# Patient Record
Sex: Male | Born: 1950 | Race: White | Hispanic: No | Marital: Married | Smoking: Never smoker
Health system: Southern US, Community
[De-identification: ages and names within clinical notes are randomized; demographics above are authoritative.]

## PROBLEM LIST (undated history)

## (undated) DIAGNOSIS — R1013 Epigastric pain: Secondary | ICD-10-CM

## (undated) DIAGNOSIS — R7303 Prediabetes: Secondary | ICD-10-CM

## (undated) DIAGNOSIS — I10 Essential (primary) hypertension: Secondary | ICD-10-CM

## (undated) DIAGNOSIS — I5189 Other ill-defined heart diseases: Secondary | ICD-10-CM

## (undated) DIAGNOSIS — E782 Mixed hyperlipidemia: Secondary | ICD-10-CM

## (undated) DIAGNOSIS — I2510 Atherosclerotic heart disease of native coronary artery without angina pectoris: Secondary | ICD-10-CM

## (undated) DIAGNOSIS — N189 Chronic kidney disease, unspecified: Secondary | ICD-10-CM

## (undated) DIAGNOSIS — M51369 Other intervertebral disc degeneration, lumbar region without mention of lumbar back pain or lower extremity pain: Secondary | ICD-10-CM

## (undated) DIAGNOSIS — R079 Chest pain, unspecified: Secondary | ICD-10-CM

## (undated) DIAGNOSIS — M1380 Other specified arthritis, unspecified site: Secondary | ICD-10-CM

## (undated) DIAGNOSIS — Z7982 Long term (current) use of aspirin: Secondary | ICD-10-CM

## (undated) DIAGNOSIS — N400 Enlarged prostate without lower urinary tract symptoms: Secondary | ICD-10-CM

## (undated) DIAGNOSIS — G4733 Obstructive sleep apnea (adult) (pediatric): Secondary | ICD-10-CM

## (undated) DIAGNOSIS — I200 Unstable angina: Secondary | ICD-10-CM

## (undated) DIAGNOSIS — R011 Cardiac murmur, unspecified: Secondary | ICD-10-CM

## (undated) DIAGNOSIS — K219 Gastro-esophageal reflux disease without esophagitis: Secondary | ICD-10-CM

## (undated) DIAGNOSIS — G4730 Sleep apnea, unspecified: Secondary | ICD-10-CM

## (undated) DIAGNOSIS — N183 Chronic kidney disease, stage 3 unspecified: Secondary | ICD-10-CM

## (undated) DIAGNOSIS — F32A Depression, unspecified: Secondary | ICD-10-CM

## (undated) DIAGNOSIS — N200 Calculus of kidney: Secondary | ICD-10-CM

## (undated) DIAGNOSIS — H269 Unspecified cataract: Secondary | ICD-10-CM

## (undated) DIAGNOSIS — K5730 Diverticulosis of large intestine without perforation or abscess without bleeding: Secondary | ICD-10-CM

## (undated) DIAGNOSIS — K591 Functional diarrhea: Secondary | ICD-10-CM

## (undated) DIAGNOSIS — Z9841 Cataract extraction status, right eye: Secondary | ICD-10-CM

## (undated) DIAGNOSIS — Z7961 Long term (current) use of immunomodulator: Secondary | ICD-10-CM

## (undated) DIAGNOSIS — E7800 Pure hypercholesterolemia, unspecified: Secondary | ICD-10-CM

## (undated) DIAGNOSIS — Z9185 Personal history of military service: Secondary | ICD-10-CM

## (undated) DIAGNOSIS — M1990 Unspecified osteoarthritis, unspecified site: Secondary | ICD-10-CM

## (undated) HISTORY — DX: Unstable angina: I20.0

## (undated) HISTORY — PX: VASECTOMY: SHX75

## (undated) HISTORY — DX: Pure hypercholesterolemia, unspecified: E78.00

## (undated) HISTORY — PX: EYE SURGERY: SHX253

## (undated) HISTORY — PX: OTHER SURGICAL HISTORY: SHX169

## (undated) HISTORY — DX: Gastro-esophageal reflux disease without esophagitis: K21.9

## (undated) HISTORY — DX: Unspecified osteoarthritis, unspecified site: M19.90

## (undated) HISTORY — DX: Sleep apnea, unspecified: G47.30

## (undated) HISTORY — DX: Mixed hyperlipidemia: E78.2

## (undated) HISTORY — DX: Chest pain, unspecified: R07.9

## (undated) HISTORY — PX: CATARACT EXTRACTION: SUR2

## (undated) HISTORY — DX: Atherosclerotic heart disease of native coronary artery without angina pectoris: I25.10

## (undated) HISTORY — DX: Essential (primary) hypertension: I10

## (undated) HISTORY — PX: COLONOSCOPY: SHX174

## (undated) HISTORY — DX: Epigastric pain: R10.13

---

## 1968-10-01 DIAGNOSIS — R010 Benign and innocent cardiac murmurs: Secondary | ICD-10-CM

## 1968-10-01 HISTORY — DX: Benign and innocent cardiac murmurs: R01.0

## 2004-09-14 ENCOUNTER — Ambulatory Visit (HOSPITAL_COMMUNITY): Admission: RE | Admit: 2004-09-14 | Discharge: 2004-09-14 | Payer: Self-pay | Admitting: Gastroenterology

## 2004-09-21 ENCOUNTER — Ambulatory Visit (HOSPITAL_COMMUNITY): Admission: RE | Admit: 2004-09-21 | Discharge: 2004-09-21 | Payer: Self-pay | Admitting: Internal Medicine

## 2004-09-23 ENCOUNTER — Ambulatory Visit (HOSPITAL_COMMUNITY): Admission: RE | Admit: 2004-09-23 | Discharge: 2004-09-23 | Payer: Self-pay | Admitting: Internal Medicine

## 2004-09-28 ENCOUNTER — Ambulatory Visit (HOSPITAL_COMMUNITY): Admission: RE | Admit: 2004-09-28 | Discharge: 2004-09-28 | Payer: Self-pay | Admitting: Internal Medicine

## 2004-10-09 ENCOUNTER — Ambulatory Visit (HOSPITAL_COMMUNITY): Admission: RE | Admit: 2004-10-09 | Discharge: 2004-10-09 | Payer: Self-pay | Admitting: Internal Medicine

## 2005-02-08 ENCOUNTER — Encounter: Admission: RE | Admit: 2005-02-08 | Discharge: 2005-02-08 | Payer: Self-pay | Admitting: Internal Medicine

## 2005-03-14 ENCOUNTER — Ambulatory Visit (HOSPITAL_BASED_OUTPATIENT_CLINIC_OR_DEPARTMENT_OTHER): Admission: RE | Admit: 2005-03-14 | Discharge: 2005-03-15 | Payer: Self-pay | Admitting: Orthopedic Surgery

## 2005-08-10 IMAGING — NM NM HEPATO W/GB/PHARM/[PERSON_NAME]
1 series · 6 of 6 positions shown · non-contrast
Comparison: none

CLINICAL DATA: abdominal pain
 NUCLEAR MEDICINE HEPATOBILIARY STUDY WITH GALLBLADDER EJECTION FRACTION:
 5 mCi of 1cOOm Choletec injected intravenously and 1.73 ug of CCK injected intravenously at sixty minutes for evaluation of gallbladder function gallbladder ejection fraction.
 Prompt homogeneous hepatic activity is noted.  Gallbladder activity is noted at fifteen minutes and small bowel activity is noted at ten to fifteen minutes. 
 After the administration of CCK, gallbladder ejection fraction is calculated at 84% at thirty minutes.

[Series 1: he hepatobiliary · 3.22mm/px · 6 of 33 frames shown]
[frame 3/33]
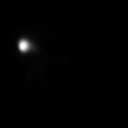
[frame 9/33]
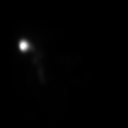
[frame 14/33]
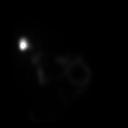
[frame 20/33]
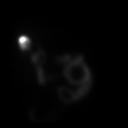
[frame 25/33]
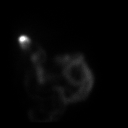
[frame 31/33]
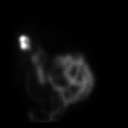

[6 of 6 positions shown; findings below may reference images not displayed]

IMPRESSION: Normal hepatobiliary study with normal gallbladder ejection fraction.

## 2008-10-01 HISTORY — PX: CARDIAC CATHETERIZATION: SHX172

## 2008-11-26 ENCOUNTER — Encounter: Admission: RE | Admit: 2008-11-26 | Discharge: 2008-11-26 | Payer: Self-pay | Admitting: Internal Medicine

## 2009-03-08 ENCOUNTER — Observation Stay (HOSPITAL_COMMUNITY): Admission: AD | Admit: 2009-03-08 | Discharge: 2009-03-10 | Payer: Self-pay | Admitting: Cardiology

## 2009-03-09 DIAGNOSIS — I2510 Atherosclerotic heart disease of native coronary artery without angina pectoris: Secondary | ICD-10-CM

## 2009-03-09 HISTORY — PX: CORONARY ANGIOPLASTY WITH STENT PLACEMENT: SHX49

## 2009-03-09 HISTORY — DX: Atherosclerotic heart disease of native coronary artery without angina pectoris: I25.10

## 2009-03-18 ENCOUNTER — Encounter: Admission: RE | Admit: 2009-03-18 | Discharge: 2009-03-18 | Payer: Self-pay | Admitting: Cardiology

## 2009-03-22 ENCOUNTER — Ambulatory Visit (HOSPITAL_COMMUNITY): Admission: RE | Admit: 2009-03-22 | Discharge: 2009-03-23 | Payer: Self-pay | Admitting: Cardiology

## 2009-03-22 HISTORY — PX: CORONARY STENT INTERVENTION: CATH118234

## 2010-02-01 ENCOUNTER — Encounter: Admission: RE | Admit: 2010-02-01 | Discharge: 2010-02-01 | Payer: Self-pay | Admitting: Nephrology

## 2010-10-01 HISTORY — PX: SHOULDER ARTHROSCOPY: SHX128

## 2010-11-01 HISTORY — PX: SHOULDER ARTHROSCOPY: SHX128

## 2010-11-09 ENCOUNTER — Encounter (HOSPITAL_COMMUNITY)
Admission: RE | Admit: 2010-11-09 | Discharge: 2010-11-09 | Disposition: A | Discharge status: Home / Self Care | Payer: BC Managed Care – PPO | Source: Ambulatory Visit | Attending: Orthopaedic Surgery | Admitting: Orthopaedic Surgery

## 2010-11-09 ENCOUNTER — Ambulatory Visit (HOSPITAL_COMMUNITY)
Admission: RE | Admit: 2010-11-09 | Discharge: 2010-11-09 | Disposition: A | Discharge status: Home / Self Care | Payer: BC Managed Care – PPO | Source: Ambulatory Visit | Attending: Orthopaedic Surgery | Admitting: Orthopaedic Surgery

## 2010-11-09 ENCOUNTER — Other Ambulatory Visit (HOSPITAL_COMMUNITY): Payer: Self-pay | Admitting: Orthopaedic Surgery

## 2010-11-09 ENCOUNTER — Other Ambulatory Visit (HOSPITAL_COMMUNITY): Payer: Self-pay

## 2010-11-09 DIAGNOSIS — M7542 Impingement syndrome of left shoulder: Secondary | ICD-10-CM

## 2010-11-09 DIAGNOSIS — R079 Chest pain, unspecified: Secondary | ICD-10-CM | POA: Insufficient documentation

## 2010-11-09 DIAGNOSIS — Z01818 Encounter for other preprocedural examination: Secondary | ICD-10-CM | POA: Insufficient documentation

## 2010-11-09 DIAGNOSIS — M25819 Other specified joint disorders, unspecified shoulder: Secondary | ICD-10-CM | POA: Insufficient documentation

## 2010-11-09 DIAGNOSIS — M47814 Spondylosis without myelopathy or radiculopathy, thoracic region: Secondary | ICD-10-CM | POA: Insufficient documentation

## 2010-11-09 LAB — BASIC METABOLIC PANEL
BUN: 17 mg/dL (ref 6–23)
CO2: 27 mEq/L (ref 19–32)
Calcium: 9.5 mg/dL (ref 8.4–10.5)
Chloride: 105 mEq/L (ref 96–112)
Creatinine, Ser: 1.18 mg/dL (ref 0.4–1.5)
Glucose, Bld: 95 mg/dL (ref 70–99)
Sodium: 140 mEq/L (ref 135–145)

## 2010-11-09 LAB — CBC
HCT: 43.6 % (ref 39.0–52.0)
Hemoglobin: 15.1 g/dL (ref 13.0–17.0)
MCH: 30.2 pg (ref 26.0–34.0)
MCHC: 34.6 g/dL (ref 30.0–36.0)
MCV: 87.2 fL (ref 78.0–100.0)
Platelets: 200 10*3/uL (ref 150–400)
RBC: 5 MIL/uL (ref 4.22–5.81)
RDW: 12.8 % (ref 11.5–15.5)
WBC: 6 10*3/uL (ref 4.0–10.5)

## 2010-11-09 LAB — SURGICAL PCR SCREEN: MRSA, PCR: NEGATIVE

## 2010-11-14 ENCOUNTER — Ambulatory Visit (HOSPITAL_COMMUNITY)
Admission: RE | Admit: 2010-11-14 | Discharge: 2010-11-14 | Disposition: A | Discharge status: Home / Self Care | Payer: BC Managed Care – PPO | Source: Ambulatory Visit | Attending: Orthopaedic Surgery | Admitting: Orthopaedic Surgery

## 2010-11-14 DIAGNOSIS — M66329 Spontaneous rupture of flexor tendons, unspecified upper arm: Secondary | ICD-10-CM | POA: Insufficient documentation

## 2010-11-14 DIAGNOSIS — Z01812 Encounter for preprocedural laboratory examination: Secondary | ICD-10-CM | POA: Insufficient documentation

## 2010-11-14 DIAGNOSIS — M25819 Other specified joint disorders, unspecified shoulder: Secondary | ICD-10-CM | POA: Insufficient documentation

## 2010-11-14 DIAGNOSIS — Z01818 Encounter for other preprocedural examination: Secondary | ICD-10-CM | POA: Insufficient documentation

## 2010-11-14 DIAGNOSIS — Z01810 Encounter for preprocedural cardiovascular examination: Secondary | ICD-10-CM | POA: Insufficient documentation

## 2010-12-06 NOTE — Op Note (Addendum)
**Note Tom Goodman via Obfuscation** [name], [name]                ACCOUNT NO.:  [id number]  MEDICAL RECORD NO.:  [id number]           PATIENT TYPE:  O  LOCATION:  SDSC                         FACILITY:  [facility]  PHYSICIAN:  Lubertha Basque. Shannara Winbush, M.D.DATE OF BIRTH:  [birth year]  DATE OF PROCEDURE:  11/14/2010 DATE OF DISCHARGE:  11/14/2010                              OPERATIVE REPORT   PREOPERATIVE DIAGNOSES: 1. Left shoulder impingement. 2. Left shoulder biceps tear.  POSTOPERATIVE DIAGNOSES: 1. Left shoulder impingement. 2. Left shoulder biceps tear.  PROCEDURES: 1. Left shoulder arthroscopic acromioplasty. 2. Left shoulder arthroscopic debridement. 3. Left shoulder arthroscopic partial claviculectomy.  ANESTHESIA:  General and block.  ATTENDING SURGEON:  Lubertha Basque. Jerl Santos, MD  ASSISTANT:  Lindwood Qua, PA   INDICATIONS FOR PROCEDURE:  The patient is a 60 year old man with a long history of left shoulder pain.  This has not responded to injections,medication, or exercise program.  By MRI scan, he has a chronic biceps rupture with some irritation of the rotator cuff consistent with impingement and partial-thickness tearing.  He has pain which limits his ability to use his arm and rest and he is offered an arthroscopy. Informed operative consent was obtained after discussion of possible complications including reaction to anesthesia and infection.  SUMMARY OF FINDINGS AND PROCEDURE:  Under general anesthesia and a block, an arthroscopy of the left shoulder was performed.  The glenohumeral joint showed no degenerative change.  He was absent the biceps tendon and the stump of this was debrided a bit.  The other labral structures were intact.  The rotator cuff appeared benign from below.  In the subacromial space, he had things consistent with impingement, related to the shape of the acromion and the distal clavicle.  An acromioplasty along with a coplane of the [facility] joint was performed.  The cuff was  debrided, but no tear worthy of repair was found.  He was discharged to home same day to follow up in the office closely.  This case was done in the main operating room due to some medical concerns.  DESCRIPTION OF PROCEDURE:  The patient was taken to the operating suite where general anesthetic was applied without difficulty.  He was also given a block in the preanesthesia area.  He was positioned in beach- chair position and prepped and draped in the normal sterile fashion. After administration of IV Kefzol, an arthroscopy of the left shoulder was performed through a total of 2 portals.  Findings were as noted above.  Procedure consisted of the debridement of the biceps stump as well as the bursa on the superior aspect of the cuff.  An acromioplasty was done with a bur in the lateral position followed by transfer of the bur to the posterior position.  The undersurface of the clavicle was also removed with the bur.  The shoulder was thoroughly irrigated followed by reapproximation of portals loosely with nylon.  Adaptic was applied followed by dry gauze and tape.  Estimated blood loss and intraoperative fluids can be obtained from anesthesia records.  DISPOSITION:  The patient was extubated in the operating room and taken to the recovery  room in stable addition.  He was to go home on the same day and follow up in the office closely.  I will contact him by phone tonight.     Lubertha Basque Jerl Santos, M.D.     PGD/MEDQ  D:  11/14/2010  T:  11/15/2010  Job:  [id number]  Electronically Signed by Marcene Corning M.D. on 12/05/2010 07:32:31 PM

## 2011-01-08 LAB — COMPREHENSIVE METABOLIC PANEL
ALT: 23 U/L (ref 0–53)
AST: 23 U/L (ref 0–37)
Albumin: 3.5 g/dL (ref 3.5–5.2)
Alkaline Phosphatase: 90 U/L (ref 39–117)
BUN: 19 mg/dL (ref 6–23)
CO2: 28 mEq/L (ref 19–32)
Calcium: 9.4 mg/dL (ref 8.4–10.5)
Chloride: 99 mEq/L (ref 96–112)
Creatinine, Ser: 1.42 mg/dL (ref 0.4–1.5)
GFR calc Af Amer: >60 mL/min (ref >60)
GFR calc non Af Amer: 51 mL/min — ABNORMAL LOW (ref >60)
Glucose, Bld: 129 mg/dL — ABNORMAL HIGH (ref 70–99)
Potassium: 3.6 mEq/L (ref 3.5–5.1)
Sodium: 135 mEq/L (ref 135–145)
Total Bilirubin: 0.7 mg/dL (ref 0.3–1.2)
Total Protein: 6.9 g/dL (ref 6.0–8.3)

## 2011-01-08 LAB — BASIC METABOLIC PANEL
BUN: 17 mg/dL (ref 6–23)
BUN: 19 mg/dL (ref 6–23)
BUN: 19 mg/dL (ref 6–23)
CO2: 28 mEq/L (ref 19–32)
CO2: 30 mEq/L (ref 19–32)
CO2: 25 mEq/L (ref 19–32)
Calcium: 9.1 mg/dL (ref 8.4–10.5)
Calcium: 9.4 mg/dL (ref 8.4–10.5)
Chloride: 101 mEq/L (ref 96–112)
Chloride: 103 mEq/L (ref 96–112)
Chloride: 102 mEq/L (ref 96–112)
Creatinine, Ser: 1.38 mg/dL (ref 0.4–1.5)
Creatinine, Ser: 1.5 mg/dL (ref 0.4–1.5)
Creatinine, Ser: 1.38 mg/dL (ref 0.4–1.5)
GFR calc Af Amer: 58 mL/min — ABNORMAL LOW (ref >60)
GFR calc Af Amer: >60 mL/min (ref >60)
GFR calc non Af Amer: 48 mL/min — ABNORMAL LOW (ref >60)
GFR calc non Af Amer: 53 mL/min — ABNORMAL LOW (ref >60)
Glucose, Bld: 106 mg/dL — ABNORMAL HIGH (ref 70–99)
Glucose, Bld: 152 mg/dL — ABNORMAL HIGH (ref 70–99)
Potassium: 4.2 mEq/L (ref 3.5–5.1)
Potassium: 4.5 mEq/L (ref 3.5–5.1)
Sodium: 137 mEq/L (ref 135–145)
Sodium: 137 mEq/L (ref 135–145)

## 2011-01-08 LAB — PLATELET COUNT: Platelets: 250 10*3/uL (ref 150–400)

## 2011-01-08 LAB — CBC
HCT: 41.4 % (ref 39.0–52.0)
HCT: 42.8 % (ref 39.0–52.0)
HCT: 43.8 % (ref 39.0–52.0)
Hemoglobin: 13.9 g/dL (ref 13.0–17.0)
Hemoglobin: 15.1 g/dL (ref 13.0–17.0)
MCHC: 33.6 g/dL (ref 30.0–36.0)
MCHC: 34.4 g/dL (ref 30.0–36.0)
MCV: 88.7 fL (ref 78.0–100.0)
MCV: 88.2 fL (ref 78.0–100.0)
MCV: 88.9 fL (ref 78.0–100.0)
Platelets: 217 10*3/uL (ref 150–400)
Platelets: 239 10*3/uL (ref 150–400)
RBC: 4.67 MIL/uL (ref 4.22–5.81)
RBC: 4.81 MIL/uL (ref 4.22–5.81)
RBC: 4.97 MIL/uL (ref 4.22–5.81)
RDW: 12.7 % (ref 11.5–15.5)
RDW: 12.4 % (ref 11.5–15.5)
WBC: 9.3 10*3/uL (ref 4.0–10.5)
WBC: 11.5 10*3/uL — ABNORMAL HIGH (ref 4.0–10.5)
WBC: 16.9 10*3/uL — ABNORMAL HIGH (ref 4.0–10.5)

## 2011-01-08 LAB — CARDIAC PANEL(CRET KIN+CKTOT+MB+TROPI)
CK, MB: 0.9 ng/mL (ref 0.3–4.0)
CK, MB: 1.3 ng/mL (ref 0.3–4.0)
CK, MB: 1.7 ng/mL (ref 0.3–4.0)
Relative Index: INVALID (ref 0.0–2.5)
Relative Index: INVALID (ref 0.0–2.5)
Relative Index: INVALID (ref 0.0–2.5)
Total CK: 70 U/L (ref 7–232)
Total CK: 94 U/L (ref 7–232)
Total CK: 99 U/L (ref 7–232)
Troponin I: 0.01 ng/mL (ref 0.00–0.06)
Troponin I: 0.01 ng/mL (ref 0.00–0.06)
Troponin I: 0.01 ng/mL (ref 0.00–0.06)

## 2011-01-08 LAB — APTT: aPTT: 30 seconds (ref 24–37)

## 2011-01-08 LAB — URINALYSIS, MICROSCOPIC ONLY
Glucose, UA: NEGATIVE mg/dL
Hgb urine dipstick: NEGATIVE
Ketones, ur: NEGATIVE mg/dL
Protein, ur: NEGATIVE mg/dL
pH: 5.5 (ref 5.0–8.0)

## 2011-01-08 LAB — URINE CULTURE: Colony Count: 8000

## 2011-01-08 LAB — PROTIME-INR
INR: 1.1 (ref 0.00–1.49)
Prothrombin Time: 14.9 seconds (ref 11.6–15.2)

## 2011-01-08 LAB — TSH: TSH: 0.796 u[IU]/mL (ref 0.350–4.500)

## 2011-02-13 NOTE — Assessment & Plan Note (Signed)
[facility]                                 ON-CALL NOTE   Tom Goodman, Tom Goodman                       MRN:          [id number]  DATE:03/10/2009                            DOB:          October 15, 1950    This is a patient of Dr. Amil Amen in [facility].   I received a telephone call from Tom Goodman's wife.  According to  the patient's wife, he has a history of coronary artery disease,  underwent coronary intervention yesterday.  He is scheduled to return  for a staged PCI next week.  Of note, he has a questionable history of  allergic reaction to IV CONTRAST DYE.  Since being discharged from the  hospital, he has developed, what appears to be a, sunburn rash over  his left chest and left arm.  There is no associated fever.  There is no  associated shortness of breath or difficulty breathing.  I explained to  the patient that without seeing the rash in person, I was unable to make  many comments about it.  However, I suspect this may be a potential  medication side effect, possibly from Plavix.  However, I recommended  that he continue taking his Plavix as well as all of his other  medications and that he will be seen by Dr. Amil Amen or by his primary  care Dannis Deroche tomorrow.  I did offer for them to come into the emergency  department for me to evaluate him; however, I explained them that I do  not feel this is necessarily necessary as he did not appear to be in  acute distress.  However, I explained that should his clinical status  deteriorate, then he should contact us again this evening.      Therisa Doyne, MD    SJT/MedQ  DD: 03/10/2009  DT: 03/11/2009  Job #: [id number]

## 2011-02-16 NOTE — Op Note (Signed)
NAMECHI, GARLOW                ACCOUNT NO.:  [id number]   MEDICAL RECORD NO.:  [id number]          PATIENT TYPE:  AMB   LOCATION:  DSC                          FACILITY:  [facility]   PHYSICIAN:  Matthew A. Weingold, M.D.DATE OF BIRTH:  1951-07-06   DATE OF PROCEDURE:  03/14/2005  DATE OF DISCHARGE:                                 OPERATIVE REPORT   PREOPERATIVE DIAGNOSIS:  Right thumb CMC arthritis.   POSTOPERATIVE DIAGNOSIS:  Right thumb CMC arthritis.   PROCEDURE:  Right thumb CMC suspension plasty with APL tendon transfer.   ASSISTANT:  Artist Pais. Mina Marble, M.D.   ASSISTANT:   ANESTHESIA:  General.   TOURNIQUET TIME:  50 minutes.   COMPLICATIONS:  None.   DRAINS:  None.   OPERATIVE REPORT:  The patient was taken to the operating room and after  induction of adequate general anesthesia, the right upper extremity was  prepped and draped in a sterile fashion.  An Esmarch was used to  exsanguinate the limb, the tourniquet was inflated to 250 mmHg.  At this  point in time, a J-shaped incision was made over the thenar eminence of the  right thumb.  The skin was incised.  The thenar muscles were elevated off  the CMC capsule.  A CMC capsulotomy was performed.  The trapezium was then  removed in piecemeal using curets, rongeurs, and osteotomes.  The [facility]  synovectomy was performed.  Next, a transosseous canal was created in the  metacarpal base from dorsal to volar using bone awl and sequential hand  drillings.  At this point in time, through a separate transverse incision in  the musculocutaneous junction of the first dorsal compartment, the APL  tendon was incised and drawn into the most distal original wound.  It was  then passed from volar to dorsal to the thumb metacarpal, wrapped around the  FCR tendon twice, sutured with 2-0 FiberWire and a Foley catheter opposed in  an abducted position, and then drawn back through the transosseous canal and  sutured to itself dorsally.   At this point in time, the wound was thoroughly  irrigated.  The thenar muscles and capsule were repaired with 4-0 Vicryl and  both  incisions with 3-0 Prolene subcuticular stitches.  Steri-Strips, 4 by 4,  fluffs, compressive hand dressing and radial volar splint was applied.  The  patient tolerated the procedure well and was taken to the recovery room in  stable condition.       MAW/MEDQ  D:  03/14/2005  T:  03/14/2005  Job:  [id number]

## 2011-02-16 NOTE — Op Note (Signed)
NAMEAVON, MERGENTHALER                ACCOUNT NO.:  [id number]   MEDICAL RECORD NO.:  [id number]          PATIENT TYPE:  AMB   LOCATION:  ENDO                         FACILITY:  [facility]   PHYSICIAN:  Danise Edge, M.D.   DATE OF BIRTH:  [birth year]   DATE OF PROCEDURE:  09/14/2004  DATE OF DISCHARGE:                                 OPERATIVE REPORT   PROCEDURE:  Colonoscopy.   INDICATIONS FOR PROCEDURE:  Mr. Tom Goodman is a 60 year old male born Sep 22, 1951.  Mr. Tom Goodman is undergoing diagnostic colonoscopy to evaluate  guaiac positive stool diagnosed by digital rectal exam performed by Dr.  Ike Bene.   ENDOSCOPIST:  Danise Edge, M.D.   PREMEDICATION:  Versed 7 mg, Demerol 70 mg.   DESCRIPTION OF PROCEDURE:  After obtaining informed consent, [name] was  placed in the left lateral decubitus position. I administered intravenous  Demerol and intravenous Versed to achieve conscious sedation for the  procedure. The patient's blood pressure, oxygen saturation and cardiac  rhythm were monitored throughout the procedure and documented in the medical  record.   Anal inspection and digital rectal exam were normal.  The prostate was  nonnodular.  The Olympus adjustable pediatric colonoscope was introduced  into the rectum and advanced to the cecum. A normal appearing ileocecal  valve was intubated and the distal ileum inspected.  Colonic preparation for  the exam today was excellent.   RECTUM:  Normal.   SIGMOID COLON AND DESCENDING COLON:  Normal.   SPLENIC FLEXURE:  Normal.   TRANSVERSE COLON:  Normal.   HEPATIC FLEXURE:  Normal.   ASCENDING COLON:  Normal.   CECUM AND ILEOCECAL VALVE:  Normal.   DISTAL ILEUM:  Normal.   ASSESSMENT:  Normal proctocolonoscopy to the cecum with inspection of the  distal ileum.      MJ/MEDQ  D:  09/14/2004  T:  09/14/2004  Job:  [id number]   cc:   Ike Bene, M.D.  [address]  [place]  [place] [address]  Fax: [phone]

## 2011-06-02 HISTORY — PX: SHOULDER ARTHROSCOPY: SHX128

## 2011-06-12 ENCOUNTER — Ambulatory Visit (HOSPITAL_COMMUNITY)
Admission: RE | Admit: 2011-06-12 | Discharge: 2011-06-12 | Disposition: A | Discharge status: Home / Self Care | Payer: BC Managed Care – PPO | Source: Ambulatory Visit | Attending: Orthopaedic Surgery | Admitting: Orthopaedic Surgery

## 2011-06-12 DIAGNOSIS — M719 Bursopathy, unspecified: Secondary | ICD-10-CM | POA: Insufficient documentation

## 2011-06-12 DIAGNOSIS — M67919 Unspecified disorder of synovium and tendon, unspecified shoulder: Secondary | ICD-10-CM | POA: Insufficient documentation

## 2011-06-12 DIAGNOSIS — Z9861 Coronary angioplasty status: Secondary | ICD-10-CM | POA: Insufficient documentation

## 2011-06-12 DIAGNOSIS — M25819 Other specified joint disorders, unspecified shoulder: Secondary | ICD-10-CM | POA: Insufficient documentation

## 2011-06-12 DIAGNOSIS — I2510 Atherosclerotic heart disease of native coronary artery without angina pectoris: Secondary | ICD-10-CM | POA: Insufficient documentation

## 2011-06-12 DIAGNOSIS — I10 Essential (primary) hypertension: Secondary | ICD-10-CM | POA: Insufficient documentation

## 2011-06-12 DIAGNOSIS — K219 Gastro-esophageal reflux disease without esophagitis: Secondary | ICD-10-CM | POA: Insufficient documentation

## 2011-06-12 LAB — BASIC METABOLIC PANEL
BUN: 15 mg/dL (ref 6–23)
Calcium: 9.2 mg/dL (ref 8.4–10.5)
Creatinine, Ser: 1.19 mg/dL (ref 0.50–1.35)
GFR calc Af Amer: >60 mL/min (ref >60)
GFR calc non Af Amer: >60 mL/min (ref >60)
Potassium: 3.9 mEq/L (ref 3.5–5.1)

## 2011-06-12 LAB — CBC
Hemoglobin: 14.3 g/dL (ref 13.0–17.0)
MCHC: 34.3 g/dL (ref 30.0–36.0)
Platelets: 191 10*3/uL (ref 150–400)

## 2011-06-12 LAB — PROTIME-INR
INR: 1.13 (ref 0.00–1.49)
Prothrombin Time: 14.7 seconds (ref 11.6–15.2)

## 2011-06-22 NOTE — Op Note (Signed)
NAMEREFORD, OLLIFF                ACCOUNT NO.:  [id number]  MEDICAL RECORD NO.:  [id number]  LOCATION:  SDSC                         FACILITY:  [facility]  PHYSICIAN:  Lubertha Basque. Talayia Hjort, M.D.DATE OF BIRTH:  05-03-1951  DATE OF PROCEDURE:  06/12/2011 DATE OF DISCHARGE:                              OPERATIVE REPORT   PREOPERATIVE DIAGNOSES: 1. Right shoulder impingement. 2. Right shoulder acromioclavicular degeneration. 3. Right shoulder partial rotator cuff tear.  POSTOPERATIVE DIAGNOSES: 1. Right shoulder impingement. 2. Right shoulder acromioclavicular degeneration. 3. Right shoulder partial rotator cuff tear.  PROCEDURES: 1. Right shoulder arthroscopic acromioplasty. 2. Right shoulder arthroscopic acromioclavicular resection. 3. Right shoulder arthroscopic debridement.  ANESTHESIA:  General and block.  ATTENDING SURGEON:  Lubertha Basque. Jerl Santos, MD  ASSISTANT:  Lindwood Qua, PA   INDICATIONS FOR PROCEDURE:  The patient is a 60 year old man with a long history of right shoulder difficulty.  This has persisted despite oral anti-inflammatories, injection, and exercise program.  He has pain which limits his ability to rest and use his arm.  He is offered an arthroscopy.  Informed operative consent was obtained after discussion of possible complications including reaction to anesthesia and infection.  He is status post a successful shoulder operation on the opposite side earlier this year.  SUMMARY, FINDINGS, AND PROCEDURE:  Under general anesthesia and a block, a right shoulder arthroscopy was performed.  The glenohumeral joint did show a one-half dime sized area of bare bone on the humeral head, addressed with chondroplasty removing some loose flaps of articular cartilage.  The glenoid appeared benign as the labral structures and biceps tendon.  The rotator cuff appeared benign from below.  In the subacromial space, he had a prominent subacromial morphology,  addressed with an acromioplasty back to a flat surface.  He had a bursal aspect partial-thickness cuff tear, addressed with a debridement, but no tear worthy of repair was found.  He also had a large spur and bone-on-bone contact at the [facility] joint and a formal AC resection was done.  He was closed primarily.  DESCRIPTION OF PROCEDURE:  The patient was taken to the operating suite where a general anesthetic was applied without difficulty.  He was also given a block in the preanesthesia area.  He was positioned in beach- chair position and prepped and draped in the normal sterile fashion. After administration of IV Kefzol, an arthroscopy of the right shoulder was performed through a total of three portals.  Findings were as noted above and procedure consisted initially of the chondroplasty of the humeral head.  We then performed an acromioplasty with the bur in the lateral position followed by transfer of the bur to the posterior position.  I then performed a distal clavicle excision with a bur in the anterior position followed by transfer of the bur to the posterior position.  The bursal aspect cuff tear was debrided and did not consist of more than 5% of thickness of the cuff.  The shoulder was thoroughly irrigated followed by reapproximation of portals loosely with nylon. Adaptic was applied followed by dry gauze and tape.  Estimated blood loss and intraoperative fluid can be obtained from anesthesia records.  DISPOSITION:  The  patient was extubated in the operating room and taken to the recovery room in stable addition.  Plans were for him to go home on same day pending anesthesia clearance.  I will contact him by phone tonight.     Lubertha Basque Jerl Santos, M.D.     PGD/MEDQ  D:  06/12/2011  T:  06/12/2011  Job:  [id number]  Electronically Signed by Marcene Corning M.D. on 06/22/2011 12:21:29 PM

## 2011-06-22 NOTE — Discharge Summary (Signed)
  NAMEELIC, VENCILL                ACCOUNT NO.:  [id number]  MEDICAL RECORD NO.:  [id number]  LOCATION:  SDSC                         FACILITY:  [facility]  PHYSICIAN:  Lubertha Basque. Jleigh Striplin, M.D.DATE OF BIRTH:  04-Nov-1950  DATE OF ADMISSION:  06/12/2011 DATE OF DISCHARGE:  06/12/2011                              DISCHARGE SUMMARY   ADMITTING DIAGNOSES: 1. Right shoulder impingement and acromioclavicular joint pain. 2. Status post left shoulder subacromial decompression. 3. Coronary artery disease with history of cardiac stents. 4. History of hypertension.  DISCHARGE DIAGNOSES: 1. Right shoulder impingement and acromioclavicular joint pain. 2. Status post left shoulder subacromial decompression. 3. Coronary artery disease with history of cardiac stents. 4. History of hypertension.  OPERATIONS:  Right shoulder arthroscopy with subacromial decompression and distal clavicle resection.  BRIEF HISTORY:  Mr. Handley is a patient well known to our practice.  He has had increasing right shoulder pain.  Now, he is having trouble sleeping at nighttime.  He has failed oral antiinflammatory medicines and corticosteroid injections and by x-rays he has got a 3A acromion. We have discussed treatment options with him that being a shoulder arthroscopy with an SAD and DCR to take care of his discomfort.  PERTINENT LABORATORY AND X-RAY FINDINGS:  Not available at the time of dictation.  HOSPITAL COURSE:  He was taken to the operating room at which time the procedures mentioned above, DCR and SAD were performed on the right shoulder.  Postoperatively, he did well.  Vital signs were stable.  He had good neurovascular status in his left upper extremity.  The right upper extremity had had a block, so he had some residual numbness from that block that was given to him for pain control, but had improving motor function on discharge.  CONDITION ON DISCHARGE:  Improved.  He may remain in the sling for a  day or two, may start to take it out and move his arm for gentle motion, may change his dressing daily, be on a regular diet.  His medicines are available on the med management discharge sheet.  One prescriptions given for oxycodone 5/325 to be taken as needed for pain. Ice, elevation, and sling p.r.n.  We will see him back in the office in 7-10 days.  Any sign of infection which will be redness, drainage, and increasing pain, call our office at [phone] and also that same number for an appointment time.  He once again will be on a regular diet.     Lindwood Qua, P.A.   ______________________________ Lubertha Basque. Jerl Santos, M.D.    MC/MEDQ  D:  06/12/2011  T:  06/12/2011  Job:  [id number]  Electronically Signed by Lindwood Qua P.A. on 06/17/2011 10:50:38 AM Electronically Signed by Marcene Corning M.D. on 06/22/2011 12:20:05 PM

## 2011-09-20 ENCOUNTER — Observation Stay (HOSPITAL_COMMUNITY)
Admission: AD | Admit: 2011-09-20 | Discharge: 2011-09-21 | Disposition: A | Discharge status: Home / Self Care | Payer: BC Managed Care – PPO | Source: Ambulatory Visit | Attending: Cardiology | Admitting: Cardiology

## 2011-09-20 ENCOUNTER — Other Ambulatory Visit: Payer: Self-pay | Admitting: Cardiology

## 2011-09-20 ENCOUNTER — Observation Stay (HOSPITAL_COMMUNITY): Payer: BC Managed Care – PPO

## 2011-09-20 DIAGNOSIS — I200 Unstable angina: Secondary | ICD-10-CM | POA: Diagnosis present

## 2011-09-20 DIAGNOSIS — E785 Hyperlipidemia, unspecified: Secondary | ICD-10-CM | POA: Insufficient documentation

## 2011-09-20 DIAGNOSIS — R61 Generalized hyperhidrosis: Secondary | ICD-10-CM | POA: Insufficient documentation

## 2011-09-20 DIAGNOSIS — R112 Nausea with vomiting, unspecified: Secondary | ICD-10-CM | POA: Insufficient documentation

## 2011-09-20 DIAGNOSIS — R079 Chest pain, unspecified: Principal | ICD-10-CM | POA: Insufficient documentation

## 2011-09-20 DIAGNOSIS — I10 Essential (primary) hypertension: Secondary | ICD-10-CM | POA: Diagnosis present

## 2011-09-20 DIAGNOSIS — G4730 Sleep apnea, unspecified: Secondary | ICD-10-CM | POA: Diagnosis present

## 2011-09-20 DIAGNOSIS — Z9861 Coronary angioplasty status: Secondary | ICD-10-CM | POA: Insufficient documentation

## 2011-09-20 DIAGNOSIS — I25118 Atherosclerotic heart disease of native coronary artery with other forms of angina pectoris: Secondary | ICD-10-CM | POA: Diagnosis present

## 2011-09-20 DIAGNOSIS — R1013 Epigastric pain: Secondary | ICD-10-CM | POA: Diagnosis present

## 2011-09-20 DIAGNOSIS — E782 Mixed hyperlipidemia: Secondary | ICD-10-CM | POA: Diagnosis present

## 2011-09-20 DIAGNOSIS — I2510 Atherosclerotic heart disease of native coronary artery without angina pectoris: Secondary | ICD-10-CM | POA: Diagnosis present

## 2011-09-20 LAB — CARDIAC PANEL(CRET KIN+CKTOT+MB+TROPI)
CK, MB: 2 ng/mL (ref 0.3–4.0)
CK, MB: 1.7 ng/mL (ref 0.3–4.0)
Relative Index: INVALID (ref 0.0–2.5)
Relative Index: INVALID (ref 0.0–2.5)
Total CK: 69 U/L (ref 7–232)
Troponin I: <0.3 ng/mL (ref <0.30)
Troponin I: <0.3 ng/mL (ref <0.30)
Troponin I: <0.3 ng/mL (ref <0.30)

## 2011-09-20 LAB — TSH: TSH: 0.969 u[IU]/mL (ref 0.350–4.500)

## 2011-09-20 LAB — COMPREHENSIVE METABOLIC PANEL
ALT: 19 U/L (ref 0–53)
AST: 15 U/L (ref 0–37)
Alkaline Phosphatase: 120 U/L — ABNORMAL HIGH (ref 39–117)
CO2: 26 mEq/L (ref 19–32)
Calcium: 9.4 mg/dL (ref 8.4–10.5)
GFR calc Af Amer: 69 mL/min — ABNORMAL LOW (ref >90)
GFR calc non Af Amer: 59 mL/min — ABNORMAL LOW (ref >90)
Glucose, Bld: 108 mg/dL — ABNORMAL HIGH (ref 70–99)
Potassium: 4.7 mEq/L (ref 3.5–5.1)
Sodium: 140 mEq/L (ref 135–145)

## 2011-09-20 LAB — DIFFERENTIAL
Basophils Absolute: 0 10*3/uL (ref 0.0–0.1)
Eosinophils Absolute: 0.2 10*3/uL (ref 0.0–0.7)
Eosinophils Relative: 3 % (ref 0–5)
Lymphocytes Relative: 23 % (ref 12–46)
Lymphs Abs: 1.4 10*3/uL (ref 0.7–4.0)
Monocytes Absolute: 0.7 10*3/uL (ref 0.1–1.0)

## 2011-09-20 LAB — CBC
Hemoglobin: 14.9 g/dL (ref 13.0–17.0)
Platelets: 210 10*3/uL (ref 150–400)
RBC: 5.06 MIL/uL (ref 4.22–5.81)
WBC: 5.9 10*3/uL (ref 4.0–10.5)

## 2011-09-20 LAB — HEMOGLOBIN A1C: Hgb A1c MFr Bld: 5.8 % — ABNORMAL HIGH (ref <5.7)

## 2011-09-20 LAB — APTT: aPTT: 30 seconds (ref 24–37)

## 2011-09-20 LAB — PROTIME-INR: INR: 1.07 (ref 0.00–1.49)

## 2011-09-20 MED ORDER — ZOLPIDEM TARTRATE 5 MG PO TABS: 10.0000 mg | ORAL_TABLET | Freq: Every evening | ORAL | Status: DC | PRN

## 2011-09-20 MED ORDER — ASPIRIN EC 81 MG PO TBEC
81.0000 mg | DELAYED_RELEASE_TABLET | Freq: Every day | ORAL | Status: DC
  Administered 2011-09-21: 81 mg via ORAL
  Filled 2011-09-20: qty 1

## 2011-09-20 MED ORDER — ACETAMINOPHEN 325 MG PO TABS
650.0000 mg | ORAL_TABLET | ORAL | Status: DC | PRN
  Administered 2011-09-21: 650 mg via ORAL
  Filled 2011-09-20: qty 2

## 2011-09-20 MED ORDER — ONDANSETRON HCL 4 MG/2ML IJ SOLN: 4.0000 mg | Freq: Four times a day (QID) | INTRAMUSCULAR | Status: DC | PRN

## 2011-09-20 MED ORDER — CARVEDILOL 12.5 MG PO TABS: 12.5000 mg | ORAL_TABLET | Freq: Two times a day (BID) | ORAL | Status: DC

## 2011-09-20 MED ORDER — PANTOPRAZOLE SODIUM 40 MG PO TBEC
40.0000 mg | DELAYED_RELEASE_TABLET | Freq: Every day | ORAL | Status: DC
  Administered 2011-09-20 – 2011-09-21 (×2): 40 mg via ORAL
  Filled 2011-09-20 (×2): qty 1

## 2011-09-20 MED ORDER — HEPARIN SOD (PORCINE) IN D5W 100 UNIT/ML IV SOLN
1000.0000 [IU]/h | INTRAVENOUS | Status: DC
  Administered 2011-09-20: 1000 [IU]/h via INTRAVENOUS
  Filled 2011-09-20: qty 250

## 2011-09-20 MED ORDER — ASPIRIN 81 MG PO TABS: 81.0000 mg | ORAL_TABLET | Freq: Every day | ORAL | Status: DC

## 2011-09-20 MED ORDER — ASPIRIN 81 MG PO CHEW: 324.0000 mg | CHEWABLE_TABLET | ORAL | Status: DC

## 2011-09-20 MED ORDER — NITROGLYCERIN 0.4 MG SL SUBL: 0.4000 mg | SUBLINGUAL_TABLET | SUBLINGUAL | Status: DC | PRN

## 2011-09-20 MED ORDER — ROSUVASTATIN CALCIUM 10 MG PO TABS
10.0000 mg | ORAL_TABLET | Freq: Every day | ORAL | Status: DC
  Administered 2011-09-20: 10 mg via ORAL
  Filled 2011-09-20: qty 1

## 2011-09-20 MED ORDER — NITROGLYCERIN IN D5W 200-5 MCG/ML-% IV SOLN
INTRAVENOUS | Status: AC
  Filled 2011-09-20: qty 250

## 2011-09-20 MED ORDER — ROSUVASTATIN CALCIUM 20 MG PO TABS
20.0000 mg | ORAL_TABLET | Freq: Every day | ORAL | Status: DC
  Filled 2011-09-20: qty 1

## 2011-09-20 MED ORDER — CARVEDILOL 12.5 MG PO TABS
12.5000 mg | ORAL_TABLET | Freq: Two times a day (BID) | ORAL | Status: DC
  Administered 2011-09-20 – 2011-09-21 (×3): 12.5 mg via ORAL
  Filled 2011-09-20 (×5): qty 1

## 2011-09-20 MED ORDER — NITROGLYCERIN IN D5W 200-5 MCG/ML-% IV SOLN: 2.0000 ug/min | INTRAVENOUS | Status: DC

## 2011-09-20 MED ORDER — CLOPIDOGREL BISULFATE 75 MG PO TABS
75.0000 mg | ORAL_TABLET | Freq: Every day | ORAL | Status: DC
  Administered 2011-09-20 – 2011-09-21 (×2): 75 mg via ORAL
  Filled 2011-09-20 (×2): qty 1

## 2011-09-20 MED ORDER — CLOPIDOGREL BISULFATE 75 MG PO TABS: 75.0000 mg | ORAL_TABLET | Freq: Every day | ORAL | Status: DC

## 2011-09-20 MED ORDER — HEPARIN BOLUS VIA INFUSION
4000.0000 [IU] | INTRAVENOUS | Status: AC
  Administered 2011-09-20: 4000 [IU] via INTRAVENOUS
  Filled 2011-09-20: qty 4000

## 2011-09-20 MED ORDER — ASPIRIN 300 MG RE SUPP
300.0000 mg | RECTAL | Status: DC
  Filled 2011-09-20: qty 1

## 2011-09-20 NOTE — Progress Notes (Signed)
ANTICOAGULATION CONSULT NOTE - Initial Consult  Pharmacy Consult for Heparin  Indication: chest pain/ unstable angina  Allergies  Allergen Reactions  . Iohexol Palpitations    Patient Measurements: Height: 5\' 11"  (180.3 cm) Weight: 179 lb 7.3 oz (81.4 kg) IBW/kg (Calculated) : 75.3  Adjusted Body Weight: 81.4kg  Vital Signs: Temp: 97.8 F (36.6 C) (12/20 0933) BP: 124/83 mmHg (12/20 1000) Pulse Rate: 64  (12/20 1000)  Labs:  [facility] 09/20/11 0937  HGB 14.9  HCT 44.1  PLT 210  APTT 30  LABPROT 14.1  INR 1.07  HEPARINUNFRC --  CREATININE 1.26  CKTOTAL --  CKMB --  TROPONINI --   Estimated Creatinine Clearance: 64.7 ml/min (by C-G formula based on Cr of 1.26).  Medical History: No past medical history on file.  Medications:  Prescriptions prior to admission  Medication Sig Dispense Refill  . aspirin 81 MG tablet Take 81 mg by mouth daily.        Marland Kitchen atorvastatin (LIPITOR) 40 MG tablet Take 40 mg by mouth daily.        . carvedilol (COREG) 12.5 MG tablet Take 12.5 mg by mouth 2 (two) times daily with a meal.        . Cats Claw, Uncaria tomentosa, (CATS CLAW PO) Take 1 tablet by mouth daily.        . clopidogrel (PLAVIX) 75 MG tablet Take 75 mg by mouth daily.        . ranitidine (ZANTAC) 150 MG tablet Take 150 mg by mouth 2 (two) times daily.        Marland Kitchen zolpidem (AMBIEN) 10 MG tablet Take 10 mg by mouth at bedtime as needed.        . tadalafil (CIALIS) 5 MG tablet Take 5 mg by mouth daily as needed.          Assessment: 60 y.o. Male with CAD/ASCVD status post drug eluting stent (DES) to OM1 03/09/09. DES to PL 03/22/09. Admitted with chest pain/SOB. To start on anticoagulation.    Goal of Therapy:  Heparin level 0.3-0.7 units/ml   Plan:  Heparin bolus 4000 units IV x1.  Start IV heparin drip at 1000 units/hr. Check heparin level in 6hrs. Daily AM heparin level and CBC.   Arman Filter 09/20/2011,10:50 AM

## 2011-09-20 NOTE — Progress Notes (Signed)
ECG, CE's, lipase reassuring.  Had epigastric discomfort after eating dinner.  Ambulating well.  Spoke to Dr. Bosie Clos on call. He will communicate to  Dr. Matthias Hughs in am for consult. Protonix NPO Abd [place] ?EGD for PUD Lipase nl.  HeparinIV off. Transfer to tele.  Check a d-dimer. If positive, CT of chest with contrast to exclude PE.

## 2011-09-20 NOTE — Progress Notes (Signed)
Report called to [facility] on 3700. Pt notified of transfer and pt to call spouse. Sent via WC and tele monitor. VSS. No c/o chest pain voiced.

## 2011-09-20 NOTE — Progress Notes (Signed)
  Echocardiogram 2D Echocardiogram has been performed.  Tom Goodman 09/20/2011, 4:18 PM

## 2011-09-20 NOTE — H&P (Signed)
Patient: Tom Goodman, Tom Goodman Provider: Donato Schultz, MD  DOB: [birth year] Age: 60 Y Sex: Male Date: 09/20/2011  Phone: [phone]   Address: [address], [place], [id number]  Pcp: RONALD POLITE       Subjective:     CC:    1. WALKIN CP/SOB. 2. pt given 1 SL Nito 0.4 MG at 8:05am . 3. Admit to hospital.        HPI:  CAD/ASCVD:  60 year old male with coronary artery disease status post drug eluting stent (DES) to OM1 03/09/09. DES to PL 03/22/09 by Dr. Amil Amen who recently underwent a nuclear stress test in February of 2012 which was overall low risk here with active chest pain. he states that he has been having increased shortness of breath over the past week and exertional chest discomfort mostly right sided/substernal that was worse this morning after awakening. He went to work but then he had increased chest pain/pressure with associated nausea that was rated at 8/10 with my old diaphoresis and right arm discomfort. With his previously placed obtuse marginal stent he had some similar symptoms to this but his most worrisome symptom at that time was nausea and diaphoresis. Denies any recent coughing, fevers, syncope, bleeding. He was told earlier that he would need to take his Plavix lifelong and he has been taking this.  He was given one sublingual nitroglycerin then another which helped him slightly with his discomfort but made him feel more dizzy. EMS was called and he will be transported to a bed in step down unit..        ROS:  The other elements of the review of systems are negative (12 total elements).       Medical History: Hypertension, Gerd, Chronic diarrhea, Early cataracts, Dyslipidemia, PUD history of, Sleep apnea, ASCVD, two-vessel, NUC stress 2/12 - Low risk, PCI/DE stent implant OM1 03/09/2009, 80% PLB lesion, s/p DES 03/22/2009, Right upper lobe pulmonary nodule.        Surgical History: Bilateral thumb surgery Dr Mina Marble 2002, heart catheterization June 2010, shoulder  arthroscopy right 06/2011 .        Hospitalization/Major Diagnostic Procedure: heart catherization 2010.        Family History: Father: deceased emphysema, smoker Mother: alive alzheimer's dementia Brother 1: hypertension Sister 1: alive hypertension, CVA 1 brother(s) , 2 sister(s) . 1 son(s) , 1 daughter(s) .        Social History:  General:  History of smoking  cigarettes: Never smoked no Alcohol.  no Recreational drug use.  Occupation: Press photographer. [employer]        Medications: Nitroglycerin 0.4 MG Tablet Sublingual 1 tablet under the tongue as directed, Plavix 75 MG Tablet 1 tablet once a day, Ambien 10 MG Tablet 1 tablet at bedtime PRN, Cialis 5 MG Tablet 1 tablet Once a day, Carvedilol 12.5 MG Tablet 1 tablet with food Twice a day, Atorvastatin Calcium 40 MG Tablet 1 tablet Once a day, Oxycodone HCl w/ APAP Tablet Extended Release 12 Hour 1 tablet prn, Aspirin 81 MG Tablet Delayed Release 1 tablet Once a day, Zantac 150 MG Tablet 1 tablet Twice a day, Medication List reviewed and reconciled with the patient       Allergies: Iv Contrast.       Objective:     Vitals: Wt 187.8, Wt change -1 lb, Ht 70, BMI 26.94, Pulse sitting 72, BP sitting 120/92.       Examination:  General Examination:  GENERAL APPEARANCE alert, oriented, mildly  anxious.  SKIN: normal, no rash.  HEENT: normal.  HEAD: [place]/AT.  EYES: EOMI, Conjunctiva clear.  NECK: supple, FROM, without evidence of thyromegaly, adenopathy, or bruits, no jugular venous distention (JVD).  LUNGS: clear to auscultation bilaterally, no wheezes, rhonchi, rales, regular breathing rate and effort.  HEART: regular rate and rhythm, no S3, S4, murmur or rub, point of maximul impulse (PMI) normal.  ABDOMEN: soft, non-tender/non-distended, bowel sounds present, no masses palpated, no bruit.  EXTREMITIES: no clubbing, no edema, pulses 2 plus bilaterally.  NEUROLOGIC EXAM: non-focal exam, alert and oriented x 3.  PERIPHERAL PULSES:  normal (2+) bilaterally.  LYMPH NODES: no cervical adenopathy.  PSYCH affect normal.  Last LDL 51 last creatinine 1.1 in March of 2012. Prior to that it was 1.5. EKG personally reviewed shows sinus rhythm with no other abnormalities. Last nuclear stress test in February of 2012 was low risk with normal EF, no ischemia.       Assessment:     Assessment:  1. Chest pain - 786.50 (Primary)  2. Unstable angina - 411.1  3. Pure hypercholesterolemia - 272.0  4. Essential hypertension, benign - 401.1, Blood pressure well controlled, continue current medication  5. Postprocedural percutaneous transluminal coronary angioplasty status - V45.82    Plan:     1. Chest pain  Diagnostic Imaging:EKG NSR, No ST changes, Harward,Amy 09/20/2011 08:12:34 AM > Elzie Knisley 09/20/2011 08:17:35 AM >  Admit to step down unit given his active chest pain. I will place him on IV heparin, IV nitroglycerin, beta blocker, aspirin, continue Plavix, obtain chest x-ray, basic metabolic profile, CBC, TSH. Serial EKGs. If cardiac markers are negative, one could consider nuclear stress test, however if ongoing chest pain or positive markers, consider cardiac catheterization to evaluate obtuse marginal and posterior descending stent. Also, we'll keep a GI etiology in the back of our minds given his nausea/vomiting times one this morning. ?Gallbladder. Pancrease. Check Lipase. I will also check an echocardiogram given his increased shortness of breath over the past week. Of course this may be ischemic related.       2. Pure hypercholesterolemia  Continue statin.       3. Essential hypertension, benign  Monitor. Well controlled.        Immunizations:        Labs:        Procedure Codes: 56213 EKG I AND R       Preventive:         Follow Up: admit      Provider: Donato Schultz, MD  Patient: Tom Goodman, Tom Goodman DOB: March 09, 1951 Date: 09/20/2011

## 2011-09-21 ENCOUNTER — Inpatient Hospital Stay (HOSPITAL_COMMUNITY): Payer: BC Managed Care – PPO

## 2011-09-21 ENCOUNTER — Other Ambulatory Visit: Payer: Self-pay

## 2011-09-21 LAB — BASIC METABOLIC PANEL
CO2: 23 mEq/L (ref 19–32)
Calcium: 9.3 mg/dL (ref 8.4–10.5)
Creatinine, Ser: 1.32 mg/dL (ref 0.50–1.35)
GFR calc Af Amer: 65 mL/min — ABNORMAL LOW (ref >90)
GFR calc non Af Amer: 56 mL/min — ABNORMAL LOW (ref >90)

## 2011-09-21 LAB — CBC
MCV: 88.1 fL (ref 78.0–100.0)
Platelets: 200 10*3/uL (ref 150–400)
RDW: 12.6 % (ref 11.5–15.5)
WBC: 6.4 10*3/uL (ref 4.0–10.5)

## 2011-09-21 LAB — LIPID PANEL
Cholesterol: 119 mg/dL (ref 0–200)
HDL: 45 mg/dL (ref >39)
Triglycerides: 198 mg/dL — ABNORMAL HIGH (ref <150)

## 2011-09-21 MED ORDER — NITROGLYCERIN 0.4 MG SL SUBL: 0.4000 mg | SUBLINGUAL_TABLET | SUBLINGUAL | Status: DC | PRN

## 2011-09-21 MED ORDER — PANTOPRAZOLE SODIUM 40 MG PO TBEC: 40.0000 mg | DELAYED_RELEASE_TABLET | Freq: Every day | ORAL | Status: DC

## 2011-09-21 NOTE — Discharge Summary (Addendum)
Patient ID: Tom Goodman MRN: [id number] DOB/AGE: Feb 13, 1951 60 y.o.  Admit date: 09/20/2011 Discharge date: 09/21/2011  Primary Discharge Diagnosis: Chest pain Secondary Discharge Diagnosis prior coronary artery disease, likely GERD related chest pain, cardiac markers negative, hypertension, hyperlipidemia, CAD with PDA/OM stent  Significant Diagnostic Studies: D-dimer negative EKG unremarkable, cardiac markers negative, chest x-ray unremarkable, awaiting abdominal ultrasound at time of this dictation  Consults: GI-Dr. Matthias Hughs - spoke with patient, he felt that he did not need an EGD at this time. He will get an abdominal ultrasound to rule out gallbladder disease. He did agree with Protonix.  Hospital Course: 60 year old male with coronary artery disease status post DES OM/PDA with recent stress test in 2/12 which was low risk who presented to my clinic yesterday morning at 8 AM with chest pressure that was quite severe, diaphoresis, one bout of nausea/vomiting and he was very concerned. I admitted to the step down unit where he ruled out from myocardial infarction with cardiac markers. His EKG was unremarkable with no ST segment changes. Chest x-ray was normal. Echocardiogram was performed which showed no wall motion abnormalities. Reassuring. Lipase was normal, liver functions normal. Consult gastroenterology to discuss the possibility of GERD related chest pain. An abdominal ultrasound is currently pending. He was taking Zantac at home but now will be taking Protonix. After abdominal ultrasound is done and if reassuring, he can be discharged home. He is ambulating without difficulty.   Discharge Exam: Blood pressure 150/90, pulse 59, temperature 97.6 F (36.4 C), resp. rate 20, height 5\' 11"  (1.803 m), weight 83.1 kg (183 lb 3.2 oz), SpO2 95.00%.    There is no epigastric tenderness at this time, chest is clear, heart is regular, no murmurs, extremities are warm, no rashes. Labs:     Lab Results  Component Value Date   WBC 6.4 09/21/2011   HGB 14.9 09/21/2011   HCT 43.7 09/21/2011   MCV 88.1 09/21/2011   PLT 200 09/21/2011    Lab 09/21/11 0640 09/20/11 0937  NA 137 --  K 4.1 --  CL 104 --  CO2 23 --  BUN 19 --  CREATININE 1.32 --  CALCIUM 9.3 --  PROT -- 7.0  BILITOT -- 0.6  ALKPHOS -- 120*  ALT -- 19  AST -- 15  GLUCOSE 107* --   Lab Results  Component Value Date   CKTOTAL 65 09/20/2011   CKMB 1.7 09/20/2011   TROPONINI <0.30 09/20/2011    Lab Results  Component Value Date   CHOL 119 09/21/2011   Lab Results  Component Value Date   HDL 45 09/21/2011   Lab Results  Component Value Date   LDLCALC 34 09/21/2011   Lab Results  Component Value Date   TRIG 198* 09/21/2011   Lab Results  Component Value Date   CHOLHDL 2.6 09/21/2011   No results found for this basename: LDLDIRECT      Radiology: As above, personally viewed chest x-ray normal. Abdominal ultrasound was negative for gall stones, pancreas poorly seen, and fatty liver noted.  IMPRESSION:  1. No gallstones. No pain over the gallbladder. 2. Probable fatty infiltration of the liver. 3. Much of the pancreas is obscured by bowel gas.  Original Report Authenticated By: Juline Patch, M.D.   EKG: Unremarkable with no ST segment changes, personally reviewed  FOLLOW UP PLANS AND APPOINTMENTS  He will see me back in clinic as previously scheduled.   BRING ALL MEDICATIONS WITH YOU TO FOLLOW UP APPOINTMENTS  Time  spent with patient to include physician time: 35 minutes spent with patient, review of medical records, review of lab work Signed: Anne Fu, Loraine Leriche 09/21/2011, 9:03 AM   I added the abdominal U/S report to the d/c summary doen by Dr. Anne Fu.

## 2011-09-21 NOTE — Progress Notes (Signed)
Pt discharged home in stable condition with discharge instructions. Montez Morita, [employer]

## 2011-09-21 NOTE — Consult Note (Signed)
Referring Provider: Dr. Donato Schultz Primary Care Physician:  Dr. Trula Slade Primary Gastroenterologist:  None (does not recall who did his screening colonoscopy several years ago)  Reason for Consultation:  Noncardiac chest pain  HPI: Tom Goodman is a 60 y.o. male status post previous stent placement who presents to the hospital yesterday with right pectoral and parasternal pain that occurred shortly after eating a grilled chicken sandwich. The pain was somewhat reminiscent in character to his previous anginal pain and was associated with some nausea and diaphoresis.  He does have a history of GERD, but he was switched several weeks ago from omeprazole to Zantac twice a day, because of his being on Plavix. He has been having a lot of burping and belching since that change was made.  No problem with prodromal anorexia, weight loss, or dysphagia other than that sometimes pills (but not food) seemed to get stuck in the back of his throat when he tries to swallow them.  There is no family history of biliary tract disease.  Past medical history: Hypertension, hyperlipidemia, history of sleep apnea, remote history of ulcer disease, coronary disease status post drug-eluting stent placement  Operations: Following surgery, shoulder arthroscopy. No abdominal surgery.  Prior to Admission medications   Medication Sig Start Date End Date Taking? Authorizing Provider  aspirin 81 MG tablet Take 81 mg by mouth daily.     Yes Historical Provider, MD  atorvastatin (LIPITOR) 40 MG tablet Take 40 mg by mouth daily.     Yes Historical Provider, MD  carvedilol (COREG) 12.5 MG tablet Take 12.5 mg by mouth 2 (two) times daily with a meal.     Yes Historical Provider, MD  Cats Claw, Uncaria tomentosa, (CATS CLAW PO) Take 1 tablet by mouth daily.     Yes Historical Provider, MD  clopidogrel (PLAVIX) 75 MG tablet Take 75 mg by mouth daily.     Yes Historical Provider, MD  ranitidine (ZANTAC) 150 MG tablet Take 150  mg by mouth 2 (two) times daily.     Yes Historical Provider, MD  zolpidem (AMBIEN) 10 MG tablet Take 10 mg by mouth at bedtime as needed.     Yes Historical Provider, MD  tadalafil (CIALIS) 5 MG tablet Take 5 mg by mouth daily as needed.      Historical Provider, MD    Current Facility-Administered Medications  Medication Dose Route Frequency Provider Last Rate Last Dose  . acetaminophen (TYLENOL) tablet 650 mg  650 mg Oral Q4H PRN Donato Schultz, MD   650 mg at 09/21/11 0145  . aspirin EC tablet 81 mg  81 mg Oral Daily Donato Schultz, MD      . carvedilol (COREG) tablet 12.5 mg  12.5 mg Oral BID WC Donato Schultz, MD   12.5 mg at 09/20/11 1758  . clopidogrel (PLAVIX) tablet 75 mg  75 mg Oral Q breakfast Donato Schultz, MD   75 mg at 09/20/11 1235  . heparin bolus via infusion 4,000 Units  4,000 Units Intravenous NOW Arman Filter, RPH   4,000 Units at 09/20/11 1145  . nitroGLYCERIN (NITROSTAT) SL tablet 0.4 mg  0.4 mg Sublingual Q5 min PRN Donato Schultz, MD      . nitroGLYCERIN 0.2 mg/mL in dextrose 5 % infusion           . ondansetron (ZOFRAN) injection 4 mg  4 mg Intravenous Q6H PRN Donato Schultz, MD      . pantoprazole (PROTONIX) EC tablet 40 mg  40  mg Oral Q1200 Donato Schultz, MD   40 mg at 09/20/11 1758  . rosuvastatin (CRESTOR) tablet 20 mg  20 mg Oral Daily Donato Schultz, MD      . zolpidem (AMBIEN) tablet 10 mg  10 mg Oral QHS PRN Donato Schultz, MD      . DISCONTD: aspirin chewable tablet 324 mg  324 mg Oral NOW Donato Schultz, MD      . DISCONTD: aspirin suppository 300 mg  300 mg Rectal NOW Donato Schultz, MD      . DISCONTD: aspirin tablet 81 mg  81 mg Oral Daily Donato Schultz, MD      . DISCONTD: carvedilol (COREG) tablet 12.5 mg  12.5 mg Oral BID WC Donato Schultz, MD      . DISCONTD: clopidogrel (PLAVIX) tablet 75 mg  75 mg Oral Daily Donato Schultz, MD      . DISCONTD: heparin ADULT infusion 100 units/ml (25000 units/250 ml)  1,000 Units/hr Intravenous Continuous Donato Schultz, MD 10 mL/hr at 09/20/11 1700  10 mL/hr at 09/20/11 1700  . DISCONTD: nitroGLYCERIN 0.2 mg/mL in dextrose 5 % infusion  2-200 mcg/min Intravenous Titrated Donato Schultz, MD      . DISCONTD: rosuvastatin (CRESTOR) tablet 10 mg  10 mg Oral q1800 Donato Schultz, MD   10 mg at 09/20/11 1758    Allergies as of 09/20/2011 - Review Complete 09/20/2011  Allergen Reaction Noted  . Iohexol Palpitations 09/15/2004    Family history: Negative for GI tract illness is as far as the patient is aware, specifically, colon cancer, colitis, liver disease, gallbladder trouble, or ulcers  History   Social History  . Marital Status: Married    Spouse Name: N/A    Number of Children: N/A  . Years of Education: N/A   Occupational History  .  works as a Copy at [employer]   Social History Main Topics  . Smoking status: Not on file  . Smokeless tobacco: Not on file  . Alcohol Use: Not on file  . Drug Use: Not on file  . Sexually Active: Not on file   Other Topics Concern  . Not on file   Social History Narrative  . No narrative on file    Review of Systems: Positive for: Above-mentioned oropharyngeal dysphagia, some recent exertional dyspnea more than he is accustomed to, and some mild lower extremity edema.  Also easy bruising on Plavix. Gen:  Denies any fever, chills, rigors, night sweats, anorexia, fatigue, weakness, malaise, involuntary weight loss, and sleep disorder CV:  Denies chest pain other than that which prompted admission, angina, palpitations, syncope, orthopnea, PND Resp: Denies dyspnea, cough, sputum, wheezing, coughing up blood. GI: Denies dysphagia, abdominal pain, nausea, vomiting, vomiting blood, jaundice, black stools, rectal bleeding, constipation, diarrhea, and fecal incontinence.    GU : Denies urinary burning, blood in urine, urinary frequency, urinary hesitancy, nocturnal urination, and urinary incontinence. MS: Denies joint pain or swelling.  Denies muscle weakness, cramps, atrophy.    Derm: Denies rash, itching, oral ulcerations, hives, unhealing ulcers.  Psych: Denies depression, anxiety, memory loss, suicidal ideation, hallucinations,  and confusion. Heme: Denies  bleeding, and enlarged lymph nodes. Neuro:  Denies any headaches, dizziness, paresthesias. Endo:  Denies any problems with heat or cold intolerance, polydipsia/polyuria, unusual weight gain.  Physical Exam: Vital signs in last 24 hours: Temp:  [97.6 F (36.4 C)-98 F (36.7 C)] 97.6 F (36.4 C) (12/21 0500) Pulse Rate:  [58-75] 59  (12/21 0500) Resp:  [  11-20] 20  (12/21 0500) BP: (114-150)/(59-90) 150/90 mmHg (12/21 0500) SpO2:  [0 %-99 %] 95 % (12/21 0500) FiO2 (%):  [92 %] 92 % (12/20 0921) Weight:  [81.194 kg (179 lb)-83.1 kg (183 lb 3.2 oz)] 183 lb 3.2 oz (83.1 kg) (12/21 0500) Last BM Date: 09/20/11 General:   Alert,  Well-developed, well-nourished, pleasant and cooperative in NAD Head:  Normocephalic and atraumatic. Eyes:  Sclera clear, no icterus.   Mouth:   No ulcerations or lesions.  Oropharynx pink & moist. Neck:   No masses or thyromegaly. Lungs:  Clear throughout to auscultation.   No wheezes, crackles, or rhonchi. No evident respiratory distress. Heart:   Regular rate and rhythm; no murmurs, clicks, rubs,  or gallops. Abdomen:  Soft, nontender, nontympanitic, and nondistended. No masses, hepatosplenomegaly or ventral hernias noted. Normal bowel sounds, without bruits, guarding, or rebound.   Msk:   Symmetrical without gross deformities. Pulses: Unable to feel radial pulse Extremities:   Without clubbing, cyanosis, or edema. No lower extremity edema at this time Neurologic:  Alert and coherent;  grossly normal neurologically. Skin:  Intact without significant lesions or rashes. Cervical Nodes:  No significant cervical adenopathy. Psych:   Alert and cooperative. Normal mood and affect.  Intake/Output from previous day: 12/20 0701 - 12/21 0700 In: 730 [P.O.:730] Out: -  Intake/Output  this shift:    Lab Results:  Basename 09/21/11 0640 09/20/11 0937  WBC 6.4 5.9  HGB 14.9 14.9  HCT 43.7 44.1  PLT 200 210   BMET  Basename 09/21/11 0640 09/20/11 0937  NA 137 140  K 4.1 4.7  CL 104 105  CO2 23 26  GLUCOSE 107* 108*  BUN 19 16  CREATININE 1.32 1.26  CALCIUM 9.3 9.4   LFT  Basename 09/20/11 0937  PROT 7.0  ALBUMIN 3.7  AST 15  ALT 19  ALKPHOS 120*  BILITOT 0.6  BILIDIR --  IBILI --   PT/INR  Basename 09/20/11 0937  LABPROT 14.1  INR 1.07     Studies/Results: Dg Chest Port 1 View  09/20/2011  *RADIOLOGY REPORT*  Clinical Data: Chest pain.  Coronary atherosclerosis. Hypertension.  Hyperlipidemia.  PORTABLE CHEST - 1 VIEW  Comparison: 11/09/2010  Findings: Heart size is normal.  Both lungs are clear.  No evidence of pleural effusion.  No mass or lymphadenopathy identified.  IMPRESSION: No active disease.  Original Report Authenticated By: Danae Orleans, M.D.    Impression: 1. Right parasternal chest pain in a patient with history of coronary artery disease, but MI ruled out by enzymes and PE essentially excluded by d-dimer. Suspect this was an exacerbation of his underlying GERD, probably precipitated by a reduction in therapy from PPI to H2 blockers. Gallbladder disease felt to be less likely. 2. History of GERD 3. Up-to-date on screening colonoscopy by his report  Plan: 1. Abdominal ultrasound to help take gallbladder disease out of the equation 2. Maintain the patient indefinitely on PPI therapy. In view of his being on Plavix, I would favor proton next, as is already ordered 3. We discussed the option of endoscopic evaluation, but felt that, for a brief episode of symptoms like this, it would probably not provide specific diagnostic information. In fact, most patients with GERD have negative endoscopies. Regardless of what it showed, he will be sent home on a PPI anyway. Therefore, we have decided not to do an EGD at this time, but we would  consider it if he has lingering or persistent symptoms  in the future. He does not have symptoms to suggest an underlying gastric malignancy. 4. The fact that pills seem to get caught in his throat, but food does not, does not sound compatible with any significant oropharyngeal anatomic or motility problem and I do not think necessitates further evaluation. 5. I have given the patient my card and encouraged him to contact my office as desired in the future if he should have any further GI symptoms, but otherwise routine GI followup was not necessary. 6. I have discussed the above with his cardiologist, Dr. Donato Schultz.    LOS: 1 day   Dalinda Heidt V  09/21/2011, 8:53 AM

## 2013-07-09 ENCOUNTER — Ambulatory Visit
Admission: RE | Admit: 2013-07-09 | Discharge: 2013-07-09 | Disposition: A | Discharge status: Home / Self Care | Payer: BC Managed Care – PPO | Source: Ambulatory Visit | Attending: Internal Medicine | Admitting: Internal Medicine

## 2013-07-09 ENCOUNTER — Other Ambulatory Visit: Payer: Self-pay | Admitting: Internal Medicine

## 2013-07-09 DIAGNOSIS — M542 Cervicalgia: Secondary | ICD-10-CM

## 2013-08-10 ENCOUNTER — Encounter: Payer: Self-pay | Admitting: Interventional Cardiology

## 2013-10-26 ENCOUNTER — Encounter: Payer: Self-pay | Admitting: *Deleted

## 2013-10-26 ENCOUNTER — Encounter: Payer: Self-pay | Admitting: Cardiology

## 2013-10-26 DIAGNOSIS — R079 Chest pain, unspecified: Secondary | ICD-10-CM | POA: Insufficient documentation

## 2013-10-26 DIAGNOSIS — E7800 Pure hypercholesterolemia, unspecified: Secondary | ICD-10-CM | POA: Insufficient documentation

## 2013-10-26 DIAGNOSIS — K219 Gastro-esophageal reflux disease without esophagitis: Secondary | ICD-10-CM | POA: Insufficient documentation

## 2013-10-26 DIAGNOSIS — M1990 Unspecified osteoarthritis, unspecified site: Secondary | ICD-10-CM | POA: Insufficient documentation

## 2013-11-03 ENCOUNTER — Ambulatory Visit (INDEPENDENT_AMBULATORY_CARE_PROVIDER_SITE_OTHER): Payer: BC Managed Care – PPO | Admitting: Cardiology

## 2013-11-03 ENCOUNTER — Encounter (INDEPENDENT_AMBULATORY_CARE_PROVIDER_SITE_OTHER): Payer: Self-pay

## 2013-11-03 ENCOUNTER — Encounter: Payer: Self-pay | Admitting: Cardiology

## 2013-11-03 VITALS — BP 140/80 | HR 64 | Ht 71.0 in | Wt 184.8 lb

## 2013-11-03 DIAGNOSIS — M79609 Pain in unspecified limb: Secondary | ICD-10-CM

## 2013-11-03 DIAGNOSIS — N182 Chronic kidney disease, stage 2 (mild): Secondary | ICD-10-CM

## 2013-11-03 DIAGNOSIS — I10 Essential (primary) hypertension: Secondary | ICD-10-CM

## 2013-11-03 DIAGNOSIS — M79606 Pain in leg, unspecified: Secondary | ICD-10-CM

## 2013-11-03 DIAGNOSIS — E7800 Pure hypercholesterolemia, unspecified: Secondary | ICD-10-CM

## 2013-11-03 DIAGNOSIS — I2510 Atherosclerotic heart disease of native coronary artery without angina pectoris: Secondary | ICD-10-CM

## 2013-11-03 NOTE — Progress Notes (Signed)
[facility] [address] [place], [place]  [address] Phone: [phone] Fax:  [phone]  Date:  11/03/2013   ID:  Tom Goodman, DOB 01/23/1951, MRN [id number]  PCP:  Provider Not In System   History of Present Illness: Tom Goodman is a 63 y.o. male with coronary artery disease, DES to obtuse marginal one on 03/09/09, DES to posterior lateral branch on 03/22/09 by Dr. Leonia Reeves, nuclear stress test 2013 which was low risk, with hypertension, hyperlipidemia here for followup.  Had episode of symptoms, EMS, did not go to ER. Felt flushed, chest pain. Trop was checked and was normal. Dr. Tamala Julian saw.   He admittedly does not get excellent sleep. +OSA only 7-8 times apnea but no need for CPAP therapy.  He had been taking a supplement advertised by Dr. Talmage Coin, cardiologist, that is not fish oil but does have [place]. feels fine but stressed. Dr. Delfina Redwood recently put him on anti-anxiety medication. He states that the lower dose made him more sleepy than the higher dose.  Overall doing well except for osteoarthritis of left hand. Left lower lateral leg pain when laying on at night. No shortness of breath, no chest pain.  Wt Readings from Last 3 Encounters:  11/03/13 184 lb 12.8 oz (83.825 kg)  09/21/11 183 lb 3.2 oz (83.1 kg)     Past Medical History  Diagnosis Date  . Coronary atherosclerosis of native coronary artery   . Essential hypertension, benign   . Intermediate coronary syndrome   . Mixed hyperlipidemia   . Epigastric abdominal pain   . Sleep apnea   . Hypercholesteremia   . GERD (gastroesophageal reflux disease)   . Hypercholesteremia   . Osteoarthritis   . Chest pain     Past Surgical History  Procedure Laterality Date  . Cardiac catheterization    . Bilateral thumb surgery dr Burney Gauze    . Shoulder arthroscopy right 06/2011, left 2013      Current Outpatient Prescriptions  Medication Sig Dispense Refill  . aspirin 81 MG tablet Take 81 mg by mouth daily.         Marland Kitchen atorvastatin (LIPITOR) 40 MG tablet Take 40 mg by mouth daily.        . citalopram (CELEXA) 20 MG tablet       . HYDROcodone-acetaminophen (NORCO) 10-325 MG per tablet       . lisinopril (PRINIVIL,ZESTRIL) 20 MG tablet       . zolpidem (AMBIEN) 10 MG tablet Take 10 mg by mouth at bedtime as needed.        . nitroGLYCERIN (NITROSTAT) 0.4 MG SL tablet Place 1 tablet (0.4 mg total) under the tongue every 5 (five) minutes as needed for chest pain.  30 tablet  12  . pantoprazole (PROTONIX) 40 MG tablet Take 1 tablet (40 mg total) by mouth daily at 12 noon.  30 tablet  12   No current facility-administered medications for this visit.    Allergies:    Allergies  Allergen Reactions  . Iohexol Palpitations    Social History:  The patient  reports that he has never smoked. He does not have any smokeless tobacco history on file.   ROS:  Please see the history of present illness.   Positive for arthritis, left finger pain, no chest pain, no shortness of breath.    PHYSICAL EXAM: VS:  BP 140/80  Pulse 64  Ht 5\' 11"  (1.803 m)  Wt 184 lb 12.8  oz (83.825 kg)  BMI 25.79 kg/m2 Well nourished, well developed, in no acute distress HEENT: normal Neck: no JVD Cardiac:  normal S1, S2; RRR; no murmur Lungs:  clear to auscultation bilaterally, no wheezing, rhonchi or rales Abd: soft, nontender, no hepatomegaly Ext: no edemaLeft second digit, MIP swelling, decreased mobility. Left lower calves appear physically normal, no tenderness to palpation, negative Homans sign Skin: warm and dry Neuro: no focal abnormalities noted  EKG:  None today  Labs: 10/14-LDL 51, creatinine 1.3    ASSESSMENT AND PLAN:  1. Coronary artery disease-stable without any exertional anginal symptoms. Prior coronary anatomy reviewed. Continue with aggressive secondary prevention. 2. Lower lateral leg pain-he will be seeing Dr. polite later today. His main concern was the arthritis on his left second digit that is causing  quite discomfort in trying to bend the joint. He also states that for the past 2-3 weeks he is had discomfort in his lateral lower legs when laying on them at night. No real trouble with walking. Since he is seeing Dr. polite later this afternoon, I have asked him to check a CPK with his statin use. 3. Hyperlipidemia-on atorvastatin 40. LDL goal less than 70. LDL 51. 4. Chronic kidney disease stage II-he is trying to avoid NSAIDs. May need other therapeutic options for his arthritis. Dr. Delfina Redwood seeing later today. 5. Hypertension-mildly elevated today. At home usually in the 211 systolic range. No changes made.  Signed, Candee Furbish, MD [facility]  11/03/2013 12:09 PM

## 2013-11-03 NOTE — Patient Instructions (Signed)
Your physician recommends that you continue on your current medications as directed. Please refer to the Current Medication list given to you today.  Your physician wants you to follow-up in: 6 months with Dr. Marlou Porch. You will receive a reminder letter in the mail two months in advance. If you don't receive a letter, please call our office to schedule the follow-up appointment.  Dr. Delfina Redwood could your order a CPK (Dx: Muscle pain lateral lower leg).

## 2013-11-04 ENCOUNTER — Ambulatory Visit: Payer: BC Managed Care – PPO | Admitting: Cardiology

## 2013-11-22 ENCOUNTER — Emergency Department (HOSPITAL_COMMUNITY): Payer: BC Managed Care – PPO

## 2013-11-22 ENCOUNTER — Inpatient Hospital Stay (HOSPITAL_COMMUNITY)
Admission: EM | Admit: 2013-11-22 | Discharge: 2013-11-23 | DRG: 287 | Disposition: A | Discharge status: Home / Self Care | Payer: BC Managed Care – PPO | Attending: Cardiology | Admitting: Cardiology

## 2013-11-22 ENCOUNTER — Encounter (HOSPITAL_COMMUNITY): Payer: Self-pay | Admitting: Emergency Medicine

## 2013-11-22 DIAGNOSIS — Z8249 Family history of ischemic heart disease and other diseases of the circulatory system: Secondary | ICD-10-CM

## 2013-11-22 DIAGNOSIS — I10 Essential (primary) hypertension: Secondary | ICD-10-CM | POA: Diagnosis present

## 2013-11-22 DIAGNOSIS — Z79899 Other long term (current) drug therapy: Secondary | ICD-10-CM

## 2013-11-22 DIAGNOSIS — Z7982 Long term (current) use of aspirin: Secondary | ICD-10-CM

## 2013-11-22 DIAGNOSIS — G4730 Sleep apnea, unspecified: Secondary | ICD-10-CM | POA: Diagnosis present

## 2013-11-22 DIAGNOSIS — Z836 Family history of other diseases of the respiratory system: Secondary | ICD-10-CM

## 2013-11-22 DIAGNOSIS — K219 Gastro-esophageal reflux disease without esophagitis: Secondary | ICD-10-CM | POA: Diagnosis present

## 2013-11-22 DIAGNOSIS — Z91041 Radiographic dye allergy status: Secondary | ICD-10-CM

## 2013-11-22 DIAGNOSIS — T82897A Other specified complication of cardiac prosthetic devices, implants and grafts, initial encounter: Secondary | ICD-10-CM | POA: Diagnosis present

## 2013-11-22 DIAGNOSIS — I2510 Atherosclerotic heart disease of native coronary artery without angina pectoris: Principal | ICD-10-CM | POA: Diagnosis present

## 2013-11-22 DIAGNOSIS — Z823 Family history of stroke: Secondary | ICD-10-CM

## 2013-11-22 DIAGNOSIS — E782 Mixed hyperlipidemia: Secondary | ICD-10-CM | POA: Diagnosis present

## 2013-11-22 DIAGNOSIS — Z9861 Coronary angioplasty status: Secondary | ICD-10-CM

## 2013-11-22 DIAGNOSIS — Y831 Surgical operation with implant of artificial internal device as the cause of abnormal reaction of the patient, or of later complication, without mention of misadventure at the time of the procedure: Secondary | ICD-10-CM | POA: Diagnosis present

## 2013-11-22 DIAGNOSIS — I2582 Chronic total occlusion of coronary artery: Secondary | ICD-10-CM | POA: Diagnosis present

## 2013-11-22 DIAGNOSIS — R079 Chest pain, unspecified: Secondary | ICD-10-CM

## 2013-11-22 DIAGNOSIS — I200 Unstable angina: Secondary | ICD-10-CM | POA: Diagnosis present

## 2013-11-22 DIAGNOSIS — M1990 Unspecified osteoarthritis, unspecified site: Secondary | ICD-10-CM | POA: Diagnosis present

## 2013-11-22 DIAGNOSIS — E7800 Pure hypercholesterolemia, unspecified: Secondary | ICD-10-CM | POA: Diagnosis present

## 2013-11-22 DIAGNOSIS — Z7902 Long term (current) use of antithrombotics/antiplatelets: Secondary | ICD-10-CM

## 2013-11-22 HISTORY — DX: Chronic kidney disease, unspecified: N18.9

## 2013-11-22 HISTORY — DX: Cardiac murmur, unspecified: R01.1

## 2013-11-22 LAB — CBC WITH DIFFERENTIAL/PLATELET
Basophils Absolute: 0 10*3/uL (ref 0.0–0.1)
Basophils Relative: 0 % (ref 0–1)
Eosinophils Absolute: 0 10*3/uL (ref 0.0–0.7)
Eosinophils Relative: 0 % (ref 0–5)
HEMATOCRIT: 42 % (ref 39.0–52.0)
Hemoglobin: 14.5 g/dL (ref 13.0–17.0)
LYMPHS ABS: 1 10*3/uL (ref 0.7–4.0)
LYMPHS PCT: 8 % — AB (ref 12–46)
MCH: 30.1 pg (ref 26.0–34.0)
MCHC: 34.5 g/dL (ref 30.0–36.0)
MCV: 87.3 fL (ref 78.0–100.0)
MONO ABS: 0.7 10*3/uL (ref 0.1–1.0)
Monocytes Relative: 5 % (ref 3–12)
Neutro Abs: 11.5 10*3/uL — ABNORMAL HIGH (ref 1.7–7.7)
Neutrophils Relative %: 87 % — ABNORMAL HIGH (ref 43–77)
PLATELETS: 203 10*3/uL (ref 150–400)
RBC: 4.81 MIL/uL (ref 4.22–5.81)
RDW: 12.5 % (ref 11.5–15.5)
WBC: 13.3 10*3/uL — AB (ref 4.0–10.5)

## 2013-11-22 LAB — TROPONIN I
Troponin I: <0.3 ng/mL (ref <0.30)
Troponin I: <0.3 ng/mL (ref <0.30)

## 2013-11-22 LAB — BASIC METABOLIC PANEL
BUN: 22 mg/dL (ref 6–23)
CALCIUM: 9.1 mg/dL (ref 8.4–10.5)
CO2: 17 meq/L — AB (ref 19–32)
Chloride: 106 mEq/L (ref 96–112)
Creatinine, Ser: 1.26 mg/dL (ref 0.50–1.35)
GFR calc Af Amer: 68 mL/min — ABNORMAL LOW (ref >90)
GFR, EST NON AFRICAN AMERICAN: 59 mL/min — AB (ref >90)
GLUCOSE: 119 mg/dL — AB (ref 70–99)
POTASSIUM: 4.4 meq/L (ref 3.7–5.3)
SODIUM: 141 meq/L (ref 137–147)

## 2013-11-22 LAB — I-STAT TROPONIN, ED: TROPONIN I, POC: 0 ng/mL (ref 0.00–0.08)

## 2013-11-22 LAB — PRO B NATRIURETIC PEPTIDE: Pro B Natriuretic peptide (BNP): 111.5 pg/mL (ref 0–125)

## 2013-11-22 LAB — PROTIME-INR
INR: 1.29 (ref 0.00–1.49)
PROTHROMBIN TIME: 15.8 s — AB (ref 11.6–15.2)

## 2013-11-22 LAB — APTT: aPTT: 117 seconds — ABNORMAL HIGH (ref 24–37)

## 2013-11-22 MED ORDER — SACCHAROMYCES BOULARDII 250 MG PO CAPS
250.0000 mg | ORAL_CAPSULE | Freq: Two times a day (BID) | ORAL | Status: DC
  Administered 2013-11-22 – 2013-11-23 (×2): 250 mg via ORAL
  Filled 2013-11-22 (×3): qty 1

## 2013-11-22 MED ORDER — NITROGLYCERIN 2 % TD OINT
0.5000 [in_us] | TOPICAL_OINTMENT | TRANSDERMAL | Status: AC
  Administered 2013-11-22: 0.5 [in_us] via TOPICAL
  Filled 2013-11-22: qty 1

## 2013-11-22 MED ORDER — LOPERAMIDE HCL 2 MG PO CAPS: 2.0000 mg | ORAL_CAPSULE | ORAL | Status: DC | PRN

## 2013-11-22 MED ORDER — HYDROCODONE-ACETAMINOPHEN 10-325 MG PO TABS
1.0000 | ORAL_TABLET | Freq: Four times a day (QID) | ORAL | Status: DC | PRN
  Administered 2013-11-23: 1 via ORAL
  Filled 2013-11-22: qty 1

## 2013-11-22 MED ORDER — FAMOTIDINE 20 MG PO TABS
20.0000 mg | ORAL_TABLET | Freq: Two times a day (BID) | ORAL | Status: DC
  Administered 2013-11-22: 20 mg via ORAL
  Filled 2013-11-22 (×2): qty 1

## 2013-11-22 MED ORDER — ZOLPIDEM TARTRATE 5 MG PO TABS: 5.0000 mg | ORAL_TABLET | Freq: Every evening | ORAL | Status: DC | PRN

## 2013-11-22 MED ORDER — SODIUM CHLORIDE 0.9 % IV SOLN: 250.0000 mL | INTRAVENOUS | Status: DC | PRN

## 2013-11-22 MED ORDER — SODIUM CHLORIDE 0.9 % IJ SOLN
3.0000 mL | Freq: Two times a day (BID) | INTRAMUSCULAR | Status: DC
  Administered 2013-11-23: 3 mL via INTRAVENOUS

## 2013-11-22 MED ORDER — NITROGLYCERIN 0.4 MG SL SUBL: 0.4000 mg | SUBLINGUAL_TABLET | SUBLINGUAL | Status: DC | PRN

## 2013-11-22 MED ORDER — SODIUM CHLORIDE 0.9 % IV SOLN
INTRAVENOUS | Status: DC
  Administered 2013-11-23: 06:00:00 via INTRAVENOUS

## 2013-11-22 MED ORDER — CLOPIDOGREL BISULFATE 75 MG PO TABS
75.0000 mg | ORAL_TABLET | Freq: Every day | ORAL | Status: DC
  Administered 2013-11-22 – 2013-11-23 (×2): 75 mg via ORAL
  Filled 2013-11-22 (×2): qty 1

## 2013-11-22 MED ORDER — PREDNISONE 50 MG PO TABS: 60.0000 mg | ORAL_TABLET | ORAL | Status: DC

## 2013-11-22 MED ORDER — LORATADINE 10 MG PO TABS
10.0000 mg | ORAL_TABLET | Freq: Every day | ORAL | Status: DC
  Administered 2013-11-23: 10 mg via ORAL
  Filled 2013-11-22: qty 1

## 2013-11-22 MED ORDER — SODIUM CHLORIDE 0.9 % IJ SOLN: 3.0000 mL | INTRAMUSCULAR | Status: DC | PRN

## 2013-11-22 MED ORDER — ATORVASTATIN CALCIUM 40 MG PO TABS
40.0000 mg | ORAL_TABLET | Freq: Every day | ORAL | Status: DC
  Filled 2013-11-22: qty 1

## 2013-11-22 MED ORDER — CITALOPRAM HYDROBROMIDE 20 MG PO TABS
20.0000 mg | ORAL_TABLET | Freq: Every day | ORAL | Status: DC
  Administered 2013-11-23: 20 mg via ORAL
  Filled 2013-11-22 (×2): qty 1

## 2013-11-22 MED ORDER — SODIUM CHLORIDE 0.9 % IJ SOLN
3.0000 mL | Freq: Two times a day (BID) | INTRAMUSCULAR | Status: DC
  Administered 2013-11-22: 3 mL via INTRAVENOUS

## 2013-11-22 MED ORDER — NITROGLYCERIN 2 % TD OINT
1.0000 [in_us] | TOPICAL_OINTMENT | Freq: Three times a day (TID) | TRANSDERMAL | Status: DC
  Administered 2013-11-22 – 2013-11-23 (×2): 1 [in_us] via TOPICAL
  Filled 2013-11-22: qty 30

## 2013-11-22 MED ORDER — PREDNISONE 10 MG PO TABS: 60.0000 mg | ORAL_TABLET | ORAL | Status: DC

## 2013-11-22 MED ORDER — HEPARIN (PORCINE) IN NACL 100-0.45 UNIT/ML-% IJ SOLN
1100.0000 [IU]/h | INTRAMUSCULAR | Status: DC
  Administered 2013-11-22: 1100 [IU]/h via INTRAVENOUS
  Filled 2013-11-22 (×2): qty 250

## 2013-11-22 MED ORDER — ONDANSETRON HCL 4 MG/2ML IJ SOLN: 4.0000 mg | Freq: Four times a day (QID) | INTRAMUSCULAR | Status: DC | PRN

## 2013-11-22 MED ORDER — ASPIRIN 81 MG PO CHEW
81.0000 mg | CHEWABLE_TABLET | Freq: Every day | ORAL | Status: DC
  Filled 2013-11-22: qty 1

## 2013-11-22 MED ORDER — PANTOPRAZOLE SODIUM 40 MG PO TBEC
40.0000 mg | DELAYED_RELEASE_TABLET | Freq: Every day | ORAL | Status: DC
  Administered 2013-11-23: 40 mg via ORAL
  Filled 2013-11-22: qty 1

## 2013-11-22 MED ORDER — DIAZEPAM 5 MG PO TABS
5.0000 mg | ORAL_TABLET | ORAL | Status: AC
  Administered 2013-11-23: 5 mg via ORAL
  Filled 2013-11-22: qty 1

## 2013-11-22 MED ORDER — HEPARIN BOLUS VIA INFUSION
4000.0000 [IU] | Freq: Once | INTRAVENOUS | Status: AC
  Administered 2013-11-22: 4000 [IU] via INTRAVENOUS
  Filled 2013-11-22: qty 4000

## 2013-11-22 MED ORDER — TRUNATURE DIGESTIVE PROBIOTIC PO CAPS: 1.0000 | ORAL_CAPSULE | Freq: Every day | ORAL | Status: DC

## 2013-11-22 MED ORDER — DIPHENHYDRAMINE HCL 50 MG/ML IJ SOLN
25.0000 mg | INTRAMUSCULAR | Status: AC
  Administered 2013-11-23: 25 mg via INTRAVENOUS
  Filled 2013-11-22: qty 1

## 2013-11-22 MED ORDER — MORPHINE SULFATE 2 MG/ML IJ SOLN
2.0000 mg | Freq: Once | INTRAMUSCULAR | Status: AC
  Administered 2013-11-22: 2 mg via INTRAVENOUS
  Filled 2013-11-22: qty 1

## 2013-11-22 MED ORDER — ASPIRIN 81 MG PO CHEW
324.0000 mg | CHEWABLE_TABLET | ORAL | Status: AC
  Administered 2013-11-23: 324 mg via ORAL
  Filled 2013-11-22: qty 4

## 2013-11-22 MED ORDER — ACETAMINOPHEN 325 MG PO TABS: 650.0000 mg | ORAL_TABLET | ORAL | Status: DC | PRN

## 2013-11-22 NOTE — ED Notes (Signed)
Per ems, pt started feeling midsternal chest pain before church this morning with some shortness of breath. Pt was in church, started sweating and patient turned pale with some nausea. Upon ems arrival, patient denied chest pain, but had some dizziness. Enroute, pt started having chest pain again, was given 4 nitro, 324 asa with some relief. Pt c/o midsternal chest pain, pt in room with heavy breathing, AAOx4.

## 2013-11-22 NOTE — Progress Notes (Signed)
Nurse reports contrast allergy.  Will premedicate with steroids, benadryl and pepcid.  Kerry Hough MD [facility]

## 2013-11-22 NOTE — ED Provider Notes (Signed)
CSN: [id number]     Arrival date & time 11/22/13  1226 History   First MD Initiated Contact with Patient 11/22/13 1227     Chief Complaint  Patient presents with  . Chest Pain  . Dizziness  . Shortness of Breath     (Consider location/radiation/quality/duration/timing/severity/associated sxs/prior Treatment) Patient is a 63 y.o. male presenting with chest pain, dizziness, and shortness of breath. The history is provided by the patient.  Chest Pain Pain location:  Substernal area Pain quality: aching   Pain radiates to:  Does not radiate Pain radiates to the back: no   Pain severity:  Moderate Onset quality:  Gradual Duration:  1 hour Timing:  Constant Progression:  Improving Chronicity:  New Context comment:  Standing at church Relieved by:  Nitroglycerin Worsened by:  Nothing tried Ineffective treatments:  None tried Associated symptoms: dizziness and shortness of breath   Associated symptoms: no abdominal pain, no cough, no fever, no headache, no nausea, no numbness and not vomiting   Shortness of breath:    Severity:  Mild   Onset quality:  Gradual   Duration:  1 hour   Timing:  Constant   Progression:  Unchanged Dizziness Associated symptoms: chest pain and shortness of breath   Associated symptoms: no diarrhea, no headaches, no nausea and no vomiting   Shortness of Breath Associated symptoms: chest pain   Associated symptoms: no abdominal pain, no cough, no fever, no headaches, no neck pain and no vomiting     Past Medical History  Diagnosis Date  . Coronary atherosclerosis of native coronary artery   . Essential hypertension, benign   . Intermediate coronary syndrome   . Mixed hyperlipidemia   . Epigastric abdominal pain   . Sleep apnea   . Hypercholesteremia   . GERD (gastroesophageal reflux disease)   . Hypercholesteremia   . Osteoarthritis   . Chest pain    Past Surgical History  Procedure Laterality Date  . Cardiac catheterization    . Bilateral  thumb surgery dr Burney Gauze    . Shoulder arthroscopy right 06/2011, left 2013     Family History  Problem Relation Age of Onset  . Emphysema Father   . Dementia Mother   . Hypertension Brother   . CVA Sister    History  Substance Use Topics  . Smoking status: Never Smoker   . Smokeless tobacco: Not on file  . Alcohol Use: Not on file    Review of Systems  Constitutional: Negative for fever.  HENT: Negative for drooling and rhinorrhea.   Eyes: Negative for pain.  Respiratory: Positive for shortness of breath. Negative for cough.   Cardiovascular: Positive for chest pain. Negative for leg swelling.  Gastrointestinal: Negative for nausea, vomiting, abdominal pain and diarrhea.  Genitourinary: Negative for dysuria and hematuria.  Musculoskeletal: Negative for gait problem and neck pain.  Skin: Negative for color change.  Neurological: Positive for dizziness. Negative for numbness and headaches.  Hematological: Negative for adenopathy.  Psychiatric/Behavioral: Negative for behavioral problems.  All other systems reviewed and are negative.      Allergies  Iohexol  Home Medications   Current Outpatient Rx  Name  Route  Sig  Dispense  Refill  . aspirin 81 MG tablet   Oral   Take 81 mg by mouth daily.          Marland Kitchen atorvastatin (LIPITOR) 40 MG tablet   Oral   Take 40 mg by mouth daily.          Marland Kitchen  citalopram (CELEXA) 20 MG tablet   Oral   Take 20 mg by mouth daily.          . clopidogrel (PLAVIX) 75 MG tablet   Oral   Take 75 mg by mouth daily.         . fexofenadine (ALLEGRA) 180 MG tablet   Oral   Take 180 mg by mouth daily.         Marland Kitchen HYDROcodone-acetaminophen (NORCO) 10-325 MG per tablet   Oral   Take 1 tablet by mouth every 6 (six) hours as needed for moderate pain.          Marland Kitchen lisinopril (PRINIVIL,ZESTRIL) 20 MG tablet   Oral   Take 20 mg by mouth daily.          Marland Kitchen loperamide (IMODIUM) 2 MG capsule   Oral   Take 2 mg by mouth daily.          . nitroGLYCERIN (NITROSTAT) 0.4 MG SL tablet   Sublingual   Place 1 tablet (0.4 mg total) under the tongue every 5 (five) minutes as needed for chest pain.   30 tablet   12   . pantoprazole (PROTONIX) 40 MG tablet   Oral   Take 1 tablet (40 mg total) by mouth daily at 12 noon.   30 tablet   12   . Probiotic Product (TRUNATURE DIGESTIVE PROBIOTIC) CAPS   Oral   Take 1 capsule by mouth daily.          BP 117/63  Pulse 60  Temp(Src) 97.9 F (36.6 C) (Oral)  Resp 24  SpO2 100% Physical Exam  Nursing note and vitals reviewed. Constitutional: He is oriented to person, place, and time. He appears well-developed and well-nourished.  HENT:  Head: Normocephalic and atraumatic.  Right Ear: External ear normal.  Left Ear: External ear normal.  Nose: Nose normal.  Mouth/Throat: Oropharynx is clear and moist. No oropharyngeal exudate.  Eyes: Conjunctivae and EOM are normal. Pupils are equal, round, and reactive to light.  Neck: Normal range of motion. Neck supple.  Cardiovascular: Normal rate, regular rhythm, normal heart sounds and intact distal pulses.  Exam reveals no gallop and no friction rub.   No murmur heard. Pulmonary/Chest: Effort normal and breath sounds normal. No respiratory distress. He has no wheezes.  Abdominal: Soft. Bowel sounds are normal. He exhibits no distension. There is no tenderness. There is no rebound and no guarding.  Musculoskeletal: Normal range of motion. He exhibits no edema and no tenderness.  Neurological: He is alert and oriented to person, place, and time.  Skin: Skin is warm. He is diaphoretic (mild).  Psychiatric: He has a normal mood and affect. His behavior is normal.    ED Course  Procedures (including critical care time) Labs Review Labs Reviewed  BASIC METABOLIC PANEL - Abnormal; Notable for the following:    CO2 17 (*)    Glucose, Bld 119 (*)    GFR calc non Af Amer 59 (*)    GFR calc Af Amer 68 (*)    All other components  within normal limits  CBC WITH DIFFERENTIAL - Abnormal; Notable for the following:    WBC 13.3 (*)    Neutrophils Relative % 87 (*)    Neutro Abs 11.5 (*)    Lymphocytes Relative 8 (*)    All other components within normal limits  COMPREHENSIVE METABOLIC PANEL - Abnormal; Notable for the following:    Albumin 3.3 (*)    GFR  calc non Af Amer 58 (*)    GFR calc Af Amer 67 (*)    All other components within normal limits  PROTIME-INR - Abnormal; Notable for the following:    Prothrombin Time 15.8 (*)    All other components within normal limits  APTT - Abnormal; Notable for the following:    aPTT 117 (*)    All other components within normal limits  CBC - Abnormal; Notable for the following:    Hemoglobin 12.9 (*)    HCT 38.7 (*)    All other components within normal limits  PRO B NATRIURETIC PEPTIDE  TROPONIN I  TROPONIN I  TROPONIN I  TROPONIN I  TSH  HEMOGLOBIN A1C  HEPARIN LEVEL (UNFRACTIONATED)  HEPARIN LEVEL (UNFRACTIONATED)  LIPID PANEL  I-STAT TROPOININ, ED  Randolm Idol, ED   Imaging Review Dg Chest 2 View  11/22/2013   CLINICAL DATA:  Short of breath and chest pain  EXAM: CHEST  2 VIEW  COMPARISON:  09/20/2011  FINDINGS: The heart size and mediastinal contours are within normal limits. Both lungs are clear. The visualized skeletal structures are unremarkable.  IMPRESSION: No active cardiopulmonary disease.   Electronically Signed   By: Franchot Gallo M.D.   On: 11/22/2013 14:39    EKG Interpretation    Date/Time:  Sunday November 22 2013 12:35:00 EST Ventricular Rate:  63 PR Interval:  178 QRS Duration: 93 QT Interval:  423 QTC Calculation: 433 R Axis:   58 Text Interpretation:  Sinus rhythm Minimal ST elevation, inferior leads Confirmed by Twisha Vanpelt  MD, Inez Stantz ([id number]) on 11/22/2013 12:39:33 PM            MDM   Final diagnoses:  Chest pain    12:59 PM 63 y.o. male w a hx of CAD s/p PCI (stent x 2) on plavix who presents with chest pain,  dizziness, diaphoresis, and nausea which began approximately one hour ago while standing at church. He notes that he initially developed mild dizziness and diaphoresis and nausea. While waiting for EMS to arrive he began having a substernal aching pain in his chest. He appeared 325 mg of aspirin and 4 nitroglycerin in route. He notes that the nitroglycerin did help relieve his pain. He is currently having 5/10 chest pain. He is afebrile and vital signs are unremarkable here. He has no focal motor or sensory deficits on exam. He states that he had similar symptoms previously when he got the cardiac stents.   Pain relieved w/ nitro paste and morphine. Trop neg. Consulted cards who will admit.     Blanchard Kelch, MD 11/23/13 [phone]

## 2013-11-22 NOTE — H&P (Signed)
CARDIOLOGY HISTORY AND PHYSICAL  CARDIOLOGY CONSULT NOTE Patient ID: Tom Goodman MRN: [id number] DOB/AGE: December 30, 1950 63 y.o.  Admit date: 11/22/2013  Primary Physician   Kandice Hams, MD Primary Cardiologist   [place] (prev. Leonia Reeves) Reason for Consultation   Chest pain  [id number] Tom Goodman is a 63 y.o. male with a history of CAD.  He was last seen 02/03 and was doing well. He is active but does not exercise regularly. He does not routinely have chest pain.  2 days ago, on his way home from work, he developed chest pain. He ate a biscuit and the pain resolved in less than 2 hours. He did not take any other meds for the pain.  Today, while sitting in church, he had onset of weakness and diaphoresis. He then developed nausea, no vomiting, and dizziness. His wife told him he was very pale. He did not feel like he would pass out or fall.   EMS was called and gave him ASA x 4. During transport, he developed chest pain and was given SL NTG x 4, which relieved the pain. The pain was tightness and about a 5/10. It has not returned. He is no longer dizzy, diaphoretic or nauseated.   In 2010, he had diaphoresis, N&V with subsequent chest pain before his stents. These symptoms remind him of the pre-PCI symptoms but are not as severe.   Past Medical History  Diagnosis Date  . Coronary atherosclerosis of native coronary artery     DES to OM1 on 03/09/09, DES to PL branch on 03/22/09 by Dr. Leonia Reeves, nuclear stress test 2013 - low risk,  . Essential hypertension, benign   . Intermediate coronary syndrome   . Mixed hyperlipidemia   . Epigastric abdominal pain   . Sleep apnea   . Hypercholesteremia   . GERD (gastroesophageal reflux disease)   . Hypercholesteremia   . Osteoarthritis   . Chest pain      Past Surgical History  Procedure Laterality Date  . Cardiac catheterization  2010    Staged PCI of the second vessel because of renal problems  . Bilateral thumb surgery dr Burney Gauze     . Shoulder arthroscopy  2012    Left Feb, Right Sept    Allergies  Allergen Reactions  . Iohexol Palpitations    I have reviewed the patient's current medications Prior to Admission medications   Medication Sig Start Date End Date Taking? Authorizing Provider  aspirin 81 MG tablet Take 81 mg by mouth daily.    Yes Historical Provider, MD  atorvastatin (LIPITOR) 40 MG tablet Take 40 mg by mouth daily.    Yes Historical Provider, MD  citalopram (CELEXA) 20 MG tablet Take 20 mg by mouth daily.  10/27/13  Yes Historical Provider, MD  clopidogrel (PLAVIX) 75 MG tablet Take 75 mg by mouth daily.   Yes Historical Provider, MD  fexofenadine (ALLEGRA) 180 MG tablet Take 180 mg by mouth daily.   Yes Historical Provider, MD  HYDROcodone-acetaminophen (NORCO) 10-325 MG per tablet Take 1 tablet by mouth every 6 (six) hours as needed for moderate pain.  10/19/13  Yes Historical Provider, MD  lisinopril (PRINIVIL,ZESTRIL) 20 MG tablet Take 20 mg by mouth daily.  10/26/13  Yes Historical Provider, MD  loperamide (IMODIUM) 2 MG capsule Take 2 mg by mouth daily.   Yes Historical Provider, MD  nitroGLYCERIN (NITROSTAT) 0.4 MG SL tablet Place 1 tablet (0.4 mg total) under the tongue every 5 (five) minutes  as needed for chest pain. 09/21/11 11/22/13 Yes Candee Furbish, MD  pantoprazole (PROTONIX) 40 MG tablet Take 1 tablet (40 mg total) by mouth daily at 12 noon. 09/21/11 11/22/13 Yes Candee Furbish, MD  Probiotic Product (TRUNATURE DIGESTIVE PROBIOTIC) CAPS Take 1 capsule by mouth daily.   Yes Historical Provider, MD     History   Social History  . Marital Status: Married    Spouse Name: N/A    Number of Children: N/A  . Years of Education: N/A   Occupational History  . 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 Smoking status: Never Smoker   . Smokeless tobacco: Not on file  . Alcohol Use: No  . Drug Use: No  . Sexual Activity: Not on file   Other Topics Concern  . Not on file   Social  History Narrative   Pt lives with wife, walks regularly.    Family Status  Relation Status Death Age  . Mother Deceased 32    No CAD  . Father Deceased 59    No CAD   Family History  Problem Relation Age of Onset  . Emphysema Father   . Dementia Mother   . Hypertension Brother   . CVA Sister      ROS:  Full 14 point review of systems complete and found to be negative unless listed above.  Physical Exam: Blood pressure 114/58, pulse 60, temperature 97.9 F (36.6 C), temperature source Oral, resp. rate 11, SpO2 98.00%.  General: Well developed, well nourished, male in no acute distress Head: Eyes PERRLA, No xanthomas.   Normocephalic and atraumatic, oropharynx without edema or exudate. Dentition: good Lungs: CTA bilaterally Heart: HRRR S1 S2, no rub/gallop, no murmur. pulses are 2+ all 4 extrem.   Neck: No carotid bruits. No lymphadenopathy.  JVD not elevated. Abdomen: Bowel sounds present, abdomen soft and non-tender without masses or hernias noted. Msk:  No spine or cva tenderness. No weakness, no joint deformities or effusions. Extremities: No clubbing or cyanosis. No edema.  Neuro: Alert and oriented X 3. No focal deficits noted. Psych:  Good affect, responds appropriately Skin: No rashes or lesions noted.  Labs:   Lab Results  Component Value Date   WBC 13.3* 11/22/2013   HGB 14.5 11/22/2013   HCT 42.0 11/22/2013   MCV 87.3 11/22/2013   PLT 203 11/22/2013      Recent Labs Lab 11/22/13 1243  NA 141  K 4.4  CL 106  CO2 17*  BUN 22  CREATININE 1.26  CALCIUM 9.1  GLUCOSE 119*     Recent Labs  11/22/13 1243  TROPONINI <0.30    Recent Labs  11/22/13 1345  TROPIPOC 0.00   Pro B Natriuretic peptide (BNP)  Date/Time Value Ref Range Status  11/22/2013 12:43 PM 111.5  0 - 125 pg/mL Final    Echo: 09/20/2011 Study Conclusions Left ventricle: The cavity size was normal. Systolic function was normal. The estimated ejection fraction was in the range of  60% to 65%. Wall motion was normal; there were no regional wall motion abnormalities. Doppler parameters are consistent with abnormal left ventricular relaxation (grade 1 diastolic dysfunction).   ECG:  11/22/2013 Vent. rate 63 BPM PR interval 178 ms QRS duration 93 ms QT/QTc 423/433 ms P-R-T axes 48 58 53  Radiology:  Dg Chest 2 View 11/22/2013   CLINICAL DATA:  Short of breath and chest pain  EXAM: CHEST  2 VIEW  COMPARISON:  09/20/2011  FINDINGS:  The heart size and mediastinal contours are within normal limits. Both lungs are clear. The visualized skeletal structures are unremarkable.  IMPRESSION: No active cardiopulmonary disease.   Electronically Signed   By: Franchot Gallo M.D.   On: 11/22/2013 14:39    ASSESSMENT AND PLAN:   The patient was seen today by Dr. Radford Pax, the patient evaluated and the data reviewed.  Principal Problem:   Unstable angina - No cath since 2010, no stress test in > 1 year. He does not routinely have anginal symptoms and his angina was atypical in 2010. Admit, cycle enzymes, add heparin, add NTG paste, continue home Rx. He is taking Plavix 3 x week since he was told OK to stop it. Will continue this but may need to give loading dose, MD advise. Feel best option is to cath him. The risks and benefits of a cardiac catheterization including, but not limited to, death, stroke, MI, kidney damage and bleeding were discussed with the patient who indicates understanding and agrees to proceed.   Hx dye reaction in the past, renal problems and ?rash. Will pretreat and hold lisinopril for now.  Check lipids in am. Continue home Rx.  Active Problems:   Essential hypertension, benign   Mixed hyperlipidemia   Signed: Rosaria Ferries, PA-C 11/22/2013 4:07 PM Beeper [phone]  Co-Sign MD

## 2013-11-22 NOTE — Progress Notes (Signed)
ANTICOAGULATION CONSULT NOTE - Initial Consult  Pharmacy Consult for heparin Indication: chest pain/ACS  Allergies  Allergen Reactions  . Iohexol Palpitations    Patient Measurements: Height: 5\' 10"  (177.8 cm) Weight: 185 lb 3 oz (84 kg) IBW/kg (Calculated) : 73 Heparin Dosing Weight: 84kg  Vital Signs: Temp: 97.9 F (36.6 C) (02/22 1239) Temp src: Oral (02/22 1239) BP: 127/65 mmHg (02/22 1630) Pulse Rate: 64 (02/22 1630)  Labs:  Recent Labs  11/22/13 1243 11/22/13 1337  HGB  --  14.5  HCT  --  42.0  PLT  --  203  CREATININE 1.26  --   TROPONINI <0.30  --     Estimated Creatinine Clearance: 61.2 ml/min (by C-G formula based on Cr of 1.26).   Medical History: Past Medical History  Diagnosis Date  . Coronary atherosclerosis of native coronary artery     DES to OM1 on 03/09/09, DES to PL branch on 03/22/09 by Dr. Leonia Reeves, nuclear stress test 2013 - low risk,  . Essential hypertension, benign   . Intermediate coronary syndrome   . Mixed hyperlipidemia   . Epigastric abdominal pain   . Sleep apnea   . Hypercholesteremia   . GERD (gastroesophageal reflux disease)   . Hypercholesteremia   . Osteoarthritis   . Chest pain     Assessment: 29 YOM with history of CAD admitted with chest pain, weakness and diaphoresis. On ASA and Plavix PTA, but no anticoagulants. Baseline Hgb 14.5, platelets 203. No bleeding noted.  Goal of Therapy:  Heparin level 0.3-0.7 units/ml Monitor platelets by anticoagulation protocol: Yes   Plan:  1. Stat INR and aPTT 2. Heparin bolus with 4000 units IV x1 3. Start heparin drip at 1100 units/hr 4. Heparin level in 6 hours 5. Daily heparin level and CBC 6. Follow for cath plans and s/s bleeding  Evelyne Makepeace D. Manish Ruggiero, PharmD, BCPS Clinical Pharmacist Pager: [phone] 11/22/2013 5:09 PM

## 2013-11-22 NOTE — ED Notes (Signed)
Cardiology at bedside.

## 2013-11-22 NOTE — H&P (Signed)
Patient seen, interviewed and examined.  He has not had any anginal symptoms recently.  He had an episode of CP Friday on the was home from work that he said got better after eating a biscuit.  Then had an episode of diaphoresis this am at [facility] along with nausea and dizziness and then had CP.  Initial cardiac enzymes are normal and EKG unchanged from prior.  Given symptoms similar to prior stents recommend starting IV Heparin gtt until rule out complete.  Will plan left heart cath in am.  Patient had ? Dye allergy in the past so will prophylax with steroids and benadryl.

## 2013-11-23 ENCOUNTER — Encounter (HOSPITAL_COMMUNITY)
Admission: EM | Disposition: A | Discharge status: Home / Self Care | Payer: Self-pay | Source: Home / Self Care | Attending: Cardiology

## 2013-11-23 DIAGNOSIS — I2510 Atherosclerotic heart disease of native coronary artery without angina pectoris: Secondary | ICD-10-CM

## 2013-11-23 HISTORY — PX: LEFT HEART CATHETERIZATION WITH CORONARY ANGIOGRAM: SHX5451

## 2013-11-23 LAB — COMPREHENSIVE METABOLIC PANEL
ALK PHOS: 99 U/L (ref 39–117)
ALT: 14 U/L (ref 0–53)
AST: 13 U/L (ref 0–37)
Albumin: 3.3 g/dL — ABNORMAL LOW (ref 3.5–5.2)
BUN: 18 mg/dL (ref 6–23)
CALCIUM: 8.5 mg/dL (ref 8.4–10.5)
CO2: 24 meq/L (ref 19–32)
Chloride: 106 mEq/L (ref 96–112)
Creatinine, Ser: 1.27 mg/dL (ref 0.50–1.35)
GFR calc Af Amer: 67 mL/min — ABNORMAL LOW (ref >90)
GFR calc non Af Amer: 58 mL/min — ABNORMAL LOW (ref >90)
Glucose, Bld: 94 mg/dL (ref 70–99)
POTASSIUM: 4.1 meq/L (ref 3.7–5.3)
Sodium: 142 mEq/L (ref 137–147)
Total Bilirubin: 0.4 mg/dL (ref 0.3–1.2)
Total Protein: 6.2 g/dL (ref 6.0–8.3)

## 2013-11-23 LAB — CBC
HEMATOCRIT: 38.7 % — AB (ref 39.0–52.0)
Hemoglobin: 12.9 g/dL — ABNORMAL LOW (ref 13.0–17.0)
MCH: 29.7 pg (ref 26.0–34.0)
MCHC: 33.3 g/dL (ref 30.0–36.0)
MCV: 89 fL (ref 78.0–100.0)
Platelets: 193 10*3/uL (ref 150–400)
RBC: 4.35 MIL/uL (ref 4.22–5.81)
RDW: 13 % (ref 11.5–15.5)
WBC: 9.7 10*3/uL (ref 4.0–10.5)

## 2013-11-23 LAB — LIPID PANEL
Cholesterol: 98 mg/dL (ref 0–200)
HDL: 46 mg/dL (ref >39)
LDL CALC: 40 mg/dL (ref 0–99)
Total CHOL/HDL Ratio: 2.1 RATIO
Triglycerides: 61 mg/dL (ref <150)
VLDL: 12 mg/dL (ref 0–40)

## 2013-11-23 LAB — HEPARIN LEVEL (UNFRACTIONATED)
Heparin Unfractionated: 0.55 IU/mL (ref 0.30–0.70)
Heparin Unfractionated: 0.66 IU/mL (ref 0.30–0.70)

## 2013-11-23 LAB — HEMOGLOBIN A1C
Hgb A1c MFr Bld: 5.6 % (ref <5.7)
MEAN PLASMA GLUCOSE: 114 mg/dL (ref <117)

## 2013-11-23 LAB — TROPONIN I: Troponin I: <0.3 ng/mL (ref <0.30)

## 2013-11-23 LAB — TSH: TSH: 0.626 u[IU]/mL (ref 0.350–4.500)

## 2013-11-23 SURGERY — LEFT HEART CATHETERIZATION WITH CORONARY ANGIOGRAM
Anesthesia: LOCAL

## 2013-11-23 MED ORDER — LIDOCAINE HCL (PF) 1 % IJ SOLN
INTRAMUSCULAR | Status: AC
  Filled 2013-11-23: qty 30

## 2013-11-23 MED ORDER — FAMOTIDINE 20 MG PO TABS
20.0000 mg | ORAL_TABLET | Freq: Two times a day (BID) | ORAL | Status: DC
  Administered 2013-11-23: 20 mg via ORAL
  Filled 2013-11-23 (×2): qty 1

## 2013-11-23 MED ORDER — HEPARIN (PORCINE) IN NACL 2-0.9 UNIT/ML-% IJ SOLN
INTRAMUSCULAR | Status: AC
  Filled 2013-11-23: qty 1500

## 2013-11-23 MED ORDER — ISOSORBIDE MONONITRATE ER 30 MG PO TB24: 30.0000 mg | ORAL_TABLET | Freq: Every day | ORAL | Status: DC

## 2013-11-23 MED ORDER — VERAPAMIL HCL 2.5 MG/ML IV SOLN
INTRAVENOUS | Status: AC
  Filled 2013-11-23: qty 2

## 2013-11-23 MED ORDER — MIDAZOLAM HCL 2 MG/2ML IJ SOLN
INTRAMUSCULAR | Status: AC
  Filled 2013-11-23: qty 2

## 2013-11-23 MED ORDER — NITROGLYCERIN 0.2 MG/ML ON CALL CATH LAB
INTRAVENOUS | Status: AC
  Filled 2013-11-23: qty 1

## 2013-11-23 MED ORDER — FENTANYL CITRATE 0.05 MG/ML IJ SOLN
INTRAMUSCULAR | Status: AC
  Filled 2013-11-23: qty 2

## 2013-11-23 MED ORDER — HEPARIN SODIUM (PORCINE) 1000 UNIT/ML IJ SOLN
INTRAMUSCULAR | Status: AC
  Filled 2013-11-23: qty 1

## 2013-11-23 MED ORDER — SODIUM CHLORIDE 0.9 % IV SOLN: INTRAVENOUS | Status: AC

## 2013-11-23 MED ORDER — NITROGLYCERIN 0.4 MG SL SUBL: 0.4000 mg | SUBLINGUAL_TABLET | SUBLINGUAL | Status: DC | PRN

## 2013-11-23 MED ORDER — ISOSORBIDE MONONITRATE ER 30 MG PO TB24
30.0000 mg | ORAL_TABLET | Freq: Every day | ORAL | Status: DC
  Administered 2013-11-23: 30 mg via ORAL
  Filled 2013-11-23: qty 1

## 2013-11-23 MED ORDER — METHYLPREDNISOLONE SODIUM SUCC 125 MG IJ SOLR
125.0000 mg | Freq: Once | INTRAMUSCULAR | Status: AC
Start: 2013-11-23 — End: 2013-11-23
  Administered 2013-11-23: 125 mg via INTRAVENOUS
  Filled 2013-11-23 (×2): qty 2

## 2013-11-23 MED ORDER — ISOSORBIDE MONONITRATE ER 60 MG PO TB24: 60.0000 mg | ORAL_TABLET | Freq: Every day | ORAL | Status: DC

## 2013-11-23 NOTE — Progress Notes (Signed)
D/c orders received;IV removed with gauze on, pt remains in stable condition, pt meds and instructions reviewed and given to pt; pt d/c to home, reviewed radial site care with pt

## 2013-11-23 NOTE — CV Procedure (Signed)
      Cardiac Catheterization Operative Report  MUBASHIR MALLEK [id number] 2/23/201511:19 AM Kandice Hams, MD  Procedure Performed:  1. Left Heart Catheterization 2. Selective Coronary Angiography 3. Left ventricular angiogram  Operator: Lauree Chandler, MD  Arterial access site:  Right radial artery.   Indication: 63 yo male with history of CAD with DES placed in [place] and [place] in 2010, now admitted with CP, negative troponin.                                       Procedure Details: The risks, benefits, complications, treatment options, and expected outcomes were discussed with the patient. The patient and/or family concurred with the proposed plan, giving informed consent. The patient was brought to the cath lab after IV hydration was begun and oral premedication was given. The patient was further sedated with Versed and Fentanyl. The right wrist was assessed with an Allens test which was positive. The right wrist was prepped and draped in a sterile fashion. 1% lidocaine was used for local anesthesia. Using the modified Seldinger access technique, a 5 French sheath was placed in the right radial artery. 3 mg Verapamil was given through the sheath. 4000 units IV heparin was given. Standard diagnostic catheters were used to perform selective coronary angiography. A pigtail catheter was used to perform a left ventricular angiogram. The sheath was removed from the right radial artery and a Terumo hemostasis band was applied at the arteriotomy site on the right wrist.   There were no immediate complications. The patient was taken to the recovery area in stable condition.   Hemodynamic Findings: Central aortic pressure: 114/60 Left ventricular pressure: 120/5/14  Angiographic Findings:  Left main: No obstructive disease.   Left Anterior Descending Artery: Large caliber vessel that courses to the apex. The proximal vessel has diffuse 20% stenosis. The mid vessel has diffuse 30%  stenosis. The distal vessel has no obstructive disease. There are two small caliber diagonal branches with 50% ostial stenosis, unchanged from previous cath. There is a large septal perforating branch with ostial 99% stenosis, unchanged from last cath.   Circumflex Artery: Moderate caliber vessel with small caliber intermediate branch, small caliber first OM branch. The small caliber intermediate branch has proximal 60% stenosis. (This is a 1.75 mm vessel). The small caliber OM branch has a proximal stent with 100% occlusion at the proximal edge of the stent. This OM branch is seen to fill from right to left collaterals.   Right Coronary Artery: Large dominant vessel with proximal luminal irregularities, mid 50-60% smooth stenosis and distal luminal irregularities. The posterolateral branch has a patent stent with no restenosis. The PDA is a small caliber vessel with no obstructive disease.   Left Ventricular Angiogram: LVEF=65%  Impression: 1. Double vessel CAD  2. Patent stent in the posterolateral branch 3. Occluded stent in OM1 with collateral filling from right to left collaterals 4. Moderate mid RCA stenosis 5. Normal LV systolic function  Recommendations: Continue medical management. Would add Imdur 30 mg po Qdaily.        Complications:  None. The patient tolerated the procedure well.

## 2013-11-23 NOTE — Progress Notes (Addendum)
We will further optimize med therapy as OP if needed. Plan discharge later today. Needs f/u with Skains.

## 2013-11-23 NOTE — Progress Notes (Addendum)
Subjective: No CP   Objective: Vital signs in last 24 hours: Temp:  [97.4 F (36.3 C)-98.4 F (36.9 C)] 98.4 F (36.9 C) (02/23 0605) Pulse Rate:  [57-66] 58 (02/23 0605) Resp:  [10-26] 18 (02/23 0605) BP: (113-137)/(55-65) 120/62 mmHg (02/23 0605) SpO2:  [97 %-100 %] 97 % (02/23 0605) Weight:  [183 lb 8 oz (83.235 kg)-185 lb 3 oz (84 kg)] 183 lb 8 oz (83.235 kg) (02/23 0605) Last BM Date: 11/22/13  Intake/Output from previous day: 02/22 0701 - 02/23 0700 In: 360 [P.O.:360] Out: -  Intake/Output this shift:    Medications Current Facility-Administered Medications  Medication Dose Route Frequency Provider Last Rate Last Dose  . 0.9 %  sodium chloride infusion  250 mL Intravenous PRN Rhonda G Barrett, PA-C      . 0.9 %  sodium chloride infusion  250 mL Intravenous PRN Rhonda G Barrett, PA-C      . 0.9 %  sodium chloride infusion   Intravenous Continuous Rhonda G Barrett, PA-C 75 mL/hr at 11/23/13 0540    . acetaminophen (TYLENOL) tablet 650 mg  650 mg Oral Q4H PRN Rhonda G Barrett, PA-C      . aspirin chewable tablet 81 mg  81 mg Oral Daily Rhonda G Barrett, PA-C      . atorvastatin (LIPITOR) tablet 40 mg  40 mg Oral q1800 Rhonda G Barrett, PA-C      . citalopram (CELEXA) tablet 20 mg  20 mg Oral Daily Rhonda G Barrett, PA-C      . clopidogrel (PLAVIX) tablet 75 mg  75 mg Oral Daily Rhonda G Barrett, PA-C   75 mg at 11/22/13 1743  . diazepam (VALIUM) tablet 5 mg  5 mg Oral On Call Rhonda G Barrett, PA-C      . diphenhydrAMINE (BENADRYL) injection 25 mg  25 mg Intravenous Pre-Cath Jacolyn Reedy, MD      . famotidine (PEPCID) tablet 20 mg  20 mg Oral BID Jacolyn Reedy, MD   20 mg at 11/22/13 2345  . heparin ADULT infusion 100 units/mL (25000 units/250 mL)  1,100 Units/hr Intravenous Continuous Lauren Bajbus, RPH 11 mL/hr at 11/22/13 1756 1,100 Units/hr at 11/22/13 1756  . HYDROcodone-acetaminophen (NORCO) 10-325 MG per tablet 1 tablet  1 tablet Oral Q6H PRN Lonn Georgia, PA-C   1 tablet at 11/23/13 3875  . loperamide (IMODIUM) capsule 2 mg  2 mg Oral PRN Evelene Croon Barrett, PA-C      . loratadine (CLARITIN) tablet 10 mg  10 mg Oral Daily Rhonda G Barrett, PA-C      . nitroGLYCERIN (NITROGLYN) 2 % ointment 1 inch  1 inch Topical 3 times per day Lonn Georgia, PA-C   1 inch at 11/23/13 [phone]  . nitroGLYCERIN (NITROSTAT) SL tablet 0.4 mg  0.4 mg Sublingual Q5 Min x 3 PRN Rhonda G Barrett, PA-C      . ondansetron (ZOFRAN) injection 4 mg  4 mg Intravenous Q6H PRN Rhonda G Barrett, PA-C      . pantoprazole (PROTONIX) EC tablet 40 mg  40 mg Oral Q1200 Rhonda G Barrett, PA-C      . predniSONE (DELTASONE) tablet 60 mg  60 mg Oral Pre-Cath Jacolyn Reedy, MD      . saccharomyces boulardii (FLORASTOR) capsule 250 mg  250 mg Oral BID Sueanne Margarita, MD   250 mg at 11/22/13 2236  . sodium chloride 0.9 % injection 3 mL  3 mL Intravenous Q12H  Evelene Croon Barrett, PA-C   3 mL at 11/22/13 2237  . sodium chloride 0.9 % injection 3 mL  3 mL Intravenous PRN Rhonda G Barrett, PA-C      . sodium chloride 0.9 % injection 3 mL  3 mL Intravenous Q12H Rhonda G Barrett, PA-C      . sodium chloride 0.9 % injection 3 mL  3 mL Intravenous PRN Rhonda G Barrett, PA-C      . zolpidem (AMBIEN) tablet 5 mg  5 mg Oral QHS PRN,MR X 1 Rhonda G Barrett, PA-C        PE: General appearance: alert, cooperative and no distress Lungs: clear to auscultation bilaterally Heart: regular rate and rhythm, S1, S2 normal, no murmur, click, rub or gallop Extremities: No LEE Pulses: 2+ and symmetric Skin: Warm and dry Neurologic: Grossly normal  Lab Results:   Recent Labs  11/22/13 1337 11/23/13 0515  WBC 13.3* 9.7  HGB 14.5 12.9*  HCT 42.0 38.7*  PLT 203 193   BMET  Recent Labs  11/22/13 1243 11/23/13 0515  NA 141 142  K 4.4 4.1  CL 106 106  CO2 17* 24  GLUCOSE 119* 94  BUN 22 18  CREATININE 1.26 1.27  CALCIUM 9.1 8.5   PT/INR  Recent Labs  11/22/13 1823  LABPROT 15.8*    INR 1.29   Cholesterol  Recent Labs  11/23/13 0515  CHOL 98   Lipid Panel     Component Value Date/Time   CHOL 98 11/23/2013 0515   TRIG 61 11/23/2013 0515   HDL 46 11/23/2013 0515   CHOLHDL 2.1 11/23/2013 0515   VLDL 12 11/23/2013 0515   LDLCALC 40 11/23/2013 0515    Cardiac Panel (last 3 results)  Recent Labs  11/22/13 1823 11/22/13 2319 11/23/13 0515  TROPONINI <0.30 <0.30 <0.30    Assessment/Plan   Principal Problem:   Unstable angina Active Problems:   Intermediate coronary syndrome   Essential hypertension, benign   Mixed hyperlipidemia  Plan:  Reports no further CP.  Ruled out for MI.  BP and HR controlled.  Left heart cath at 1630hrs.  IV contrast allergy.  The patient reports being given prednisone a month ago and his lips swelled up.   Giving Pepcid, benadryl then solumedrol on call to cath lab. I am seeing patient post cath and know findings. No immediate post cath complications.   LOS: 1 day    HAGER, BRYAN 11/23/2013 8:16 AM

## 2013-11-23 NOTE — Interval H&P Note (Signed)
History and Physical Interval Note:  11/23/2013 10:38 AM  Tom Goodman  has presented today for cardiac cath with the diagnosis of chest pain, known CAD.  The various methods of treatment have been discussed with the patient and family. After consideration of risks, benefits and other options for treatment, the patient has consented to  Procedure(s): LEFT HEART CATHETERIZATION WITH CORONARY ANGIOGRAM (N/A) as a surgical intervention .  The patient's history has been reviewed, patient examined, no change in status, stable for surgery.  I have reviewed the patient's chart and labs.  Questions were answered to the patient's satisfaction.    Cath Lab Visit (complete for each Cath Lab visit)  Clinical Evaluation Leading to the Procedure:   ACS: no  Non-ACS:    Anginal Classification: CCS III  Anti-ischemic medical therapy: No Therapy  Non-Invasive Test Results: No non-invasive testing performed  Prior CABG: No previous CABG        Damaris Geers

## 2013-11-23 NOTE — Discharge Instructions (Signed)

## 2013-11-23 NOTE — Progress Notes (Signed)
UR Completed Lan Entsminger Graves-Bigelow, RN,BSN [phone]  

## 2013-11-23 NOTE — Progress Notes (Signed)
[facility] for Heparin Indication: chest pain/ACS  Allergies  Allergen Reactions  . Iohexol Palpitations    Patient Measurements: Height: 5\' 10"  (177.8 cm) Weight: 185 lb 3 oz (84 kg) IBW/kg (Calculated) : 73 Heparin Dosing Weight: 84kg  Vital Signs: Temp: 97.6 F (36.4 C) (02/22 2153) Temp src: Oral (02/22 2153) BP: 127/55 mmHg (02/22 2153) Pulse Rate: 65 (02/22 2153)  Labs:  Recent Labs  11/22/13 1243 11/22/13 1337 11/22/13 1823 11/22/13 2319  HGB  --  14.5  --   --   HCT  --  42.0  --   --   PLT  --  203  --   --   APTT  --   --  117*  --   LABPROT  --   --  15.8*  --   INR  --   --  1.29  --   HEPARINUNFRC  --   --   --  0.55  CREATININE 1.26  --   --   --   TROPONINI <0.30  --  <0.30 <0.30    Estimated Creatinine Clearance: 61.2 ml/min (by C-G formula based on Cr of 1.26).  Assessment: 63 y/o M on heparin for CP. First HL is 0.55. Other labs as above.   Goal of Therapy:  Heparin level 0.3-0.7 units/ml Monitor platelets by anticoagulation protocol: Yes   Plan:  -Continue heparin at 1100 units/hr -AM HL to confirm -Daily CBC/HL -Monitor for bleeding  -Plan for cath today  Thank you for allowing me to take part in this patient's care,  Narda Bonds, PharmD Clinical Pharmacist Phone: [phone] Pager: [phone] 11/23/2013 12:44 AM

## 2013-11-23 NOTE — Progress Notes (Signed)
Order received for heart cath premeds d/t contrast allergy.  Patient reports having an allergy to steroids, causing swelling of his lips, in the past.  Prednisone not given d/t steroid allergy.  Dr. Wynonia Lawman notified.  Tom Goodman

## 2013-11-25 NOTE — Discharge Summary (Addendum)
Plans crafted, note is accurate, and agree with discharge. See my note addended to [facility] note on 11/23/13

## 2013-11-25 NOTE — Discharge Summary (Signed)
Physician Discharge Summary     Patient ID: Tom Goodman MRN: [id number] DOB/AGE: 1950/11/03 63 y.o.  Admit date: 11/22/2013 Discharge date: 11/25/2013  Admission Diagnoses:  Unstable Angina  Discharge Diagnoses:  Principal Problem:   Unstable angina Active Problems:   Intermediate coronary syndrome   Essential hypertension, benign   Mixed hyperlipidemia   Discharged Condition: stable  Hospital Course:   Tom Goodman is a 63 y.o. male with a history of CAD. He was last seen 02/03 and was doing well. He is active but does not exercise regularly. He does not routinely have chest pain. Two days ago, on his way home from work, he developed chest pain. He ate a biscuit and the pain resolved in less than 2 hours. He did not take any other meds for the pain.  The day he was admitted, while sitting in church, he had onset of weakness and diaphoresis. He then developed nausea, no vomiting, and dizziness.  His wife told him he was very pale. He did not feel like he would pass out or fall.   EMS was called and gave him ASA x 4. During transport, he developed chest pain and was given SL NTG x 4, which relieved the pain. The pain was tightness and about a 5/10. It has not returned. He is no longer dizzy, diaphoretic or nauseated.  In 2010, he had diaphoresis, N&V with subsequent chest pain before his stents. These symptoms remind him of the pre-PCI symptoms but are not as severe.    The patient was admitted and started on IV heparin.  He ruled out for MI. He had no further chest pain.  He was scheduled for coronary angiography.  He was premedicated with pepcid, benadryl and solumedrol due to contrast allergy.  The cath  revealed two vessel CAD, Patent stent in the posterolateral branch.  Occluded stent in OM1 with collateral filling from right to left collaterals.  Moderate mid RCA stenosis.  Normal LV systolic function.  Medical management was continued.  Imdur 30mg  was added.  The patient was seen  by Dr. Tamala Julian who felt he was stable for DC home.   Consults: None  Significant Diagnostic Studies:  11/23/13, Coronary angiography  Hemodynamic Findings:  Central aortic pressure: 114/60  Left ventricular pressure: 120/5/14  Angiographic Findings:  Left main: No obstructive disease.  Left Anterior Descending Artery: Large caliber vessel that courses to the apex. The proximal vessel has diffuse 20% stenosis. The mid vessel has diffuse 30% stenosis. The distal vessel has no obstructive disease. There are two small caliber diagonal branches with 50% ostial stenosis, unchanged from previous cath. There is a large septal perforating branch with ostial 99% stenosis, unchanged from last cath.  Circumflex Artery: Moderate caliber vessel with small caliber intermediate branch, small caliber first OM branch. The small caliber intermediate branch has proximal 60% stenosis. (This is a 1.75 mm vessel). The small caliber OM branch has a proximal stent with 100% occlusion at the proximal edge of the stent. This OM branch is seen to fill from right to left collaterals.  Right Coronary Artery: Large dominant vessel with proximal luminal irregularities, mid 50-60% smooth stenosis and distal luminal irregularities. The posterolateral branch has a patent stent with no restenosis. The PDA is a small caliber vessel with no obstructive disease.  Left Ventricular Angiogram: LVEF=65%  Impression:  1. Double vessel CAD  2. Patent stent in the posterolateral branch  3. Occluded stent in OM1 with collateral filling from right to  left collaterals  4. Moderate mid RCA stenosis  5. Normal LV systolic function  Recommendations: Continue medical management. Would add Imdur 30 mg po Qdaily.  Complications: None. The patient tolerated the procedure well.   Treatments: See above  Discharge Exam: Blood pressure 117/57, pulse 65, temperature 98.2 F (36.8 C), temperature source Oral, resp. rate 18, height 5\' 10"  (1.778 m),  weight 183 lb 8 oz (83.235 kg), SpO2 96.00%.   Disposition: 01-Home or Self Care  Discharge Orders   Future Appointments Provider Department Dept Phone   12/15/2013 2:45 PM Candee Furbish, MD [place] [phone]   01/13/2014 8:00 AM [facility] [employer] [phone]   Future Orders Complete By Expires   Diet - low sodium heart healthy  As directed    Discharge instructions  As directed    Comments:     No lifting with your right hand for three days.   Increase activity slowly  As directed        Medication List         aspirin 81 MG tablet  Take 81 mg by mouth daily.     atorvastatin 40 MG tablet  Commonly known as:  LIPITOR  Take 40 mg by mouth daily.     citalopram 20 MG tablet  Commonly known as:  CELEXA  Take 20 mg by mouth daily.     clopidogrel 75 MG tablet  Commonly known as:  PLAVIX  Take 75 mg by mouth daily.     fexofenadine 180 MG tablet  Commonly known as:  ALLEGRA  Take 180 mg by mouth daily.     HYDROcodone-acetaminophen 10-325 MG per tablet  Commonly known as:  NORCO  Take 1 tablet by mouth every 6 (six) hours as needed for moderate pain.     isosorbide mononitrate 30 MG 24 hr tablet  Commonly known as:  IMDUR  Take 1 tablet (30 mg total) by mouth daily.     lisinopril 20 MG tablet  Commonly known as:  PRINIVIL,ZESTRIL  Take 20 mg by mouth daily.     loperamide 2 MG capsule  Commonly known as:  IMODIUM  Take 2 mg by mouth daily.     nitroGLYCERIN 0.4 MG SL tablet  Commonly known as:  NITROSTAT  Place 1 tablet (0.4 mg total) under the tongue every 5 (five) minutes as needed for chest pain.     nitroGLYCERIN 0.4 MG SL tablet  Commonly known as:  NITROSTAT  Place 1 tablet (0.4 mg total) under the tongue every 5 (five) minutes x 3 doses as needed for chest pain.     pantoprazole 40 MG tablet  Commonly known as:  PROTONIX  Take 1 tablet (40 mg total) by mouth daily at 12 noon.     TRUNATURE  DIGESTIVE PROBIOTIC Caps  Take 1 capsule by mouth daily.           Follow-up Information   Follow up with Candee Furbish, MD On 12/15/2013. (2:45Pm)    Specialty:  Cardiology   Contact information:   [address] [place] [address] [phone]       Signed: Tarri Fuller 11/25/2013, 8:56 AM

## 2013-12-15 ENCOUNTER — Encounter (INDEPENDENT_AMBULATORY_CARE_PROVIDER_SITE_OTHER): Payer: Self-pay

## 2013-12-15 ENCOUNTER — Ambulatory Visit (INDEPENDENT_AMBULATORY_CARE_PROVIDER_SITE_OTHER): Payer: BC Managed Care – PPO | Admitting: Cardiology

## 2013-12-15 ENCOUNTER — Encounter: Payer: Self-pay | Admitting: Cardiology

## 2013-12-15 VITALS — BP 125/68 | HR 81 | Ht 70.0 in | Wt 184.0 lb

## 2013-12-15 DIAGNOSIS — I2510 Atherosclerotic heart disease of native coronary artery without angina pectoris: Secondary | ICD-10-CM

## 2013-12-15 DIAGNOSIS — E7800 Pure hypercholesterolemia, unspecified: Secondary | ICD-10-CM

## 2013-12-15 DIAGNOSIS — I10 Essential (primary) hypertension: Secondary | ICD-10-CM

## 2013-12-15 MED ORDER — ATORVASTATIN CALCIUM 40 MG PO TABS: 40.0000 mg | ORAL_TABLET | Freq: Every day | ORAL | Status: DC

## 2013-12-15 MED ORDER — CLOPIDOGREL BISULFATE 75 MG PO TABS: 75.0000 mg | ORAL_TABLET | Freq: Every day | ORAL | Status: DC

## 2013-12-15 MED ORDER — ISOSORBIDE MONONITRATE ER 30 MG PO TB24: 30.0000 mg | ORAL_TABLET | Freq: Every day | ORAL | Status: DC

## 2013-12-15 MED ORDER — LISINOPRIL 20 MG PO TABS: 20.0000 mg | ORAL_TABLET | Freq: Every day | ORAL | Status: DC

## 2013-12-15 NOTE — Patient Instructions (Signed)
Your physician recommends that you continue on your current medications as directed. Please refer to the Current Medication list given to you today.  Your physician wants you to follow-up in: 6 months with Dr. Skains. You will receive a reminder letter in the mail two months in advance. If you don't receive a letter, please call our office to schedule the follow-up appointment.  

## 2013-12-15 NOTE — Progress Notes (Signed)
[facility] [facility]. [address] [place], [place]  [address] Phone: [phone] Fax:  [phone]  Date:  12/15/2013   ID:  Tom Goodman, DOB May 24, 1951, MRN [id number]  PCP:  Tom Hams, MD   History of Present Illness: Tom Goodman is a 63 y.o. male with coronary artery disease, DES to obtuse marginal one on 03/09/09 (occluded), DES to posterior lateral branch on 03/22/09 by Dr. Leonia Reeves, nuclear stress test 2013 which was low risk, with hypertension, hyperlipidemia here for followup. He was hospitalized on 11/22/13 with symptoms of feeling weak, pale appearance while at church. These are similar symptoms to his prior stenting. He underwent cardiac catheterization which showed occluded obtuse marginal stent, 50% RCA, patent DES in the posterior lateral branch. Collateral blood flow was noted to obtuse marginal. Normal EF. He was started on isosorbide. Troponin was negative.  I take care of his wife Tom Goodman.  Wt Readings from Last 3 Encounters:  12/15/13 184 lb (83.462 kg)  11/23/13 183 lb 8 oz (83.235 kg)  11/23/13 183 lb 8 oz (83.235 kg)     Past Medical History  Diagnosis Date  . Coronary atherosclerosis of native coronary artery     DES to OM1 on 03/09/09, DES to PL branch on 03/22/09 by Dr. Leonia Reeves, nuclear stress test 2013 - low risk,  . Essential hypertension, benign   . Intermediate coronary syndrome   . Mixed hyperlipidemia   . Epigastric abdominal pain   . Sleep apnea   . Hypercholesteremia   . GERD (gastroesophageal reflux disease)   . Hypercholesteremia   . Osteoarthritis   . Chest pain   . Heart murmur   . Chronic kidney disease     Past Surgical History  Procedure Laterality Date  . Cardiac catheterization  2010    Staged PCI of the second vessel because of renal problems  . Bilateral thumb surgery dr Burney Gauze    . Shoulder arthroscopy  2012    Left Feb, Right Sept    Current Outpatient Prescriptions  Medication Sig Dispense Refill  .  aspirin 81 MG tablet Take 81 mg by mouth daily.       Marland Kitchen atorvastatin (LIPITOR) 40 MG tablet Take 40 mg by mouth daily.       . citalopram (CELEXA) 20 MG tablet Take 20 mg by mouth daily.       . clopidogrel (PLAVIX) 75 MG tablet Take 75 mg by mouth daily.      . fexofenadine (ALLEGRA) 180 MG tablet Take 180 mg by mouth daily.      Marland Kitchen HYDROcodone-acetaminophen (NORCO) 10-325 MG per tablet Take 1 tablet by mouth every 6 (six) hours as needed for moderate pain.       . isosorbide mononitrate (IMDUR) 30 MG 24 hr tablet Take 1 tablet (30 mg total) by mouth daily.  30 tablet  5  . lisinopril (PRINIVIL,ZESTRIL) 20 MG tablet Take 20 mg by mouth daily.       Marland Kitchen loperamide (IMODIUM) 2 MG capsule Take 2 mg by mouth daily.      . nitroGLYCERIN (NITROSTAT) 0.4 MG SL tablet Place 1 tablet (0.4 mg total) under the tongue every 5 (five) minutes x 3 doses as needed for chest pain.  25 tablet  12  . pantoprazole (PROTONIX) 40 MG tablet Take 1 tablet (40 mg total) by mouth daily at 12 noon.  30 tablet  12  . Probiotic Product (TRUNATURE DIGESTIVE PROBIOTIC) CAPS Take  1 capsule by mouth daily.       No current facility-administered medications for this visit.    Allergies:    Allergies  Allergen Reactions  . Corticosteroids Swelling    Swelling of lips  . Prednisone     Swollen lips  . Iohexol Palpitations    Social History:  The patient  reports that he has never smoked. He does not have any smokeless tobacco history on file. He reports that he does not drink alcohol or use illicit drugs.   ROS:  Please see the history of present illness.   Positive for arthritis, left finger pain, no chest pain, no shortness of breath.    PHYSICAL EXAM: VS:  BP 125/68  Pulse 81  Ht 5\' 10"  (1.778 m)  Wt 184 lb (83.462 kg)  BMI 26.40 kg/m2 Well nourished, well developed, in no acute distress HEENT: normal Neck: no JVD Cardiac:  normal S1, S2; RRR; no murmur Lungs:  clear to auscultation bilaterally, no wheezing,  rhonchi or rales Abd: soft, nontender, no hepatomegaly Ext: no edema Skin: warm and dry Neuro: no focal abnormalities noted  EKG:  None today  Labs: 10/14-LDL 51, creatinine 1.3    ASSESSMENT AND PLAN:  1. Coronary artery disease-we have restarted Plavix and will continue isosorbide. Currently doing well. No symptoms. He asked if we need to increase the isosorbide dose. I do not think that this is necessary at this point.  2. Hyperlipidemia-on atorvastatin 40. LDL goal less than 70. LDL 51. 3. Chronic kidney disease stage II-he is trying to avoid NSAIDs. May need other therapeutic options for his arthritis.  4. Hypertension-normal today. At home usually in the 814 systolic range. No changes made.  Signed, Candee Furbish, MD [facility]  12/15/2013 3:14 PM

## 2013-12-31 ENCOUNTER — Other Ambulatory Visit: Payer: Self-pay | Admitting: *Deleted

## 2013-12-31 DIAGNOSIS — E7800 Pure hypercholesterolemia, unspecified: Secondary | ICD-10-CM

## 2013-12-31 DIAGNOSIS — Z79899 Other long term (current) drug therapy: Secondary | ICD-10-CM

## 2014-01-13 ENCOUNTER — Other Ambulatory Visit: Payer: BC Managed Care – PPO

## 2014-04-09 ENCOUNTER — Telehealth: Payer: Self-pay | Admitting: Cardiology

## 2014-04-09 NOTE — Telephone Encounter (Signed)
New message           Pt needs permission to stop plavix and aspirin so that he can get an injection in his back / please fax this permission letter to this number [phone]

## 2014-04-09 NOTE — Telephone Encounter (Signed)
To Dr. Marlou Porch, please advise.

## 2014-04-11 NOTE — Telephone Encounter (Signed)
OK to halt Plavix and ASA for injection. Has been greater than one year post DES placement. Very small risk of stent thrombosis at this point.  Candee Furbish, MD

## 2014-04-12 NOTE — Telephone Encounter (Signed)
Faxed

## 2014-04-28 ENCOUNTER — Encounter: Payer: Self-pay | Admitting: Interventional Cardiology

## 2014-07-23 ENCOUNTER — Other Ambulatory Visit: Payer: Self-pay

## 2014-07-23 MED ORDER — PANTOPRAZOLE SODIUM 40 MG PO TBEC: 40.0000 mg | DELAYED_RELEASE_TABLET | Freq: Every day | ORAL | Status: DC

## 2014-08-06 ENCOUNTER — Other Ambulatory Visit: Payer: Self-pay | Admitting: Cardiology

## 2014-08-06 MED ORDER — ATORVASTATIN CALCIUM 40 MG PO TABS: 40.0000 mg | ORAL_TABLET | Freq: Every day | ORAL | Status: DC

## 2014-08-10 ENCOUNTER — Ambulatory Visit: Payer: BC Managed Care – PPO | Admitting: Cardiology

## 2014-08-13 ENCOUNTER — Ambulatory Visit: Payer: BC Managed Care – PPO | Admitting: Cardiology

## 2014-09-09 ENCOUNTER — Encounter (HOSPITAL_COMMUNITY): Payer: Self-pay | Admitting: Interventional Cardiology

## 2014-09-16 ENCOUNTER — Encounter: Payer: Self-pay | Admitting: Cardiology

## 2014-09-16 ENCOUNTER — Ambulatory Visit (INDEPENDENT_AMBULATORY_CARE_PROVIDER_SITE_OTHER): Payer: BC Managed Care – PPO | Admitting: Cardiology

## 2014-09-16 VITALS — BP 130/72 | HR 82 | Ht 70.0 in | Wt 182.0 lb

## 2014-09-16 DIAGNOSIS — I10 Essential (primary) hypertension: Secondary | ICD-10-CM

## 2014-09-16 DIAGNOSIS — I2510 Atherosclerotic heart disease of native coronary artery without angina pectoris: Secondary | ICD-10-CM

## 2014-09-16 DIAGNOSIS — E782 Mixed hyperlipidemia: Secondary | ICD-10-CM

## 2014-09-16 MED ORDER — ATORVASTATIN CALCIUM 40 MG PO TABS: 40.0000 mg | ORAL_TABLET | Freq: Every day | ORAL | Status: DC

## 2014-09-16 MED ORDER — LISINOPRIL 20 MG PO TABS: 20.0000 mg | ORAL_TABLET | Freq: Every day | ORAL | Status: DC

## 2014-09-16 MED ORDER — CLOPIDOGREL BISULFATE 75 MG PO TABS: 75.0000 mg | ORAL_TABLET | Freq: Every day | ORAL | Status: DC

## 2014-09-16 MED ORDER — ISOSORBIDE MONONITRATE ER 30 MG PO TB24: 30.0000 mg | ORAL_TABLET | Freq: Every day | ORAL | Status: DC

## 2014-09-16 NOTE — Patient Instructions (Signed)
The current medical regimen is effective;  continue present plan and medications.  Follow up in 6 months with Dr. Skains.  You will receive a letter in the mail 2 months before you are due.  Please call us when you receive this letter to schedule your follow up appointment.  Thank you for choosing [facility]!!     

## 2014-09-16 NOTE — Progress Notes (Signed)
[facility] [address] [place], [place]  [address] Phone: [phone] Fax:  [phone]  Date:  09/16/2014   ID:  ABRHAM Goodman, DOB 27-Jun-1951, MRN [id number]  PCP:  Kandice Hams, MD   History of Present Illness: Tom Goodman is a 63 y.o. male with coronary artery disease, DES to obtuse marginal one on 03/09/09 (occluded), DES to posterior lateral branch on 03/22/09 by Dr. Leonia Reeves, nuclear stress test 2013 which was low risk, with hypertension, hyperlipidemia here for followup. He was hospitalized on 11/22/13 with symptoms of feeling weak, pale appearance while at church. These are similar symptoms to his prior stenting. He underwent cardiac catheterization which showed occluded obtuse marginal stent, 50% RCA, patent DES in the posterior lateral branch. Collateral blood flow was noted to obtuse marginal. Normal EF. He was started on isosorbide. Troponin was negative.  Recently was at [facility] because of sinus infection, hypotension blood pressure 70 at urgent care. Antibiotics were given. EKG was done. Normal per patient. 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  I take care of his wife Tom Goodman.  Wt Readings from Last 3 Encounters:  09/16/14 182 lb (82.555 kg)  12/15/13 184 lb (83.462 kg)  11/23/13 183 lb 8 oz (83.235 kg)     Past Medical History  Diagnosis Date  . Coronary atherosclerosis of native coronary artery     DES to OM1 on 03/09/09, DES to PL branch on 03/22/09 by Dr. Leonia Reeves, nuclear stress test 2013 - low risk,  . Essential hypertension, benign   . Intermediate coronary syndrome   . Mixed hyperlipidemia   . Epigastric abdominal pain   . Sleep apnea   . Hypercholesteremia   . GERD (gastroesophageal reflux disease)   . Hypercholesteremia   . Osteoarthritis   . Chest pain   . Heart murmur   . Chronic kidney disease     Past Surgical History  Procedure Laterality Date  .  Cardiac catheterization  2010    Staged PCI of the second vessel because of renal problems  . Bilateral thumb surgery dr Burney Gauze    . Shoulder arthroscopy  2012    Left Feb, Right Sept  . Left heart catheterization with coronary angiogram N/A 11/23/2013    Procedure: LEFT HEART CATHETERIZATION WITH CORONARY ANGIOGRAM;  Surgeon: Jettie Booze, MD;  Location: [facility] CATH LAB;  Service: Cardiovascular;  Laterality: N/A;    Current Outpatient Prescriptions  Medication Sig Dispense Refill  . aspirin 81 MG tablet Take 81 mg by mouth daily.     Marland Kitchen atorvastatin (LIPITOR) 40 MG tablet Take 1 tablet (40 mg total) by mouth daily. 90 tablet 0  . citalopram (CELEXA) 20 MG tablet Take 20 mg by mouth daily.     . clopidogrel (PLAVIX) 75 MG tablet Take 1 tablet (75 mg total) by mouth daily. 90 tablet 2  . fexofenadine (ALLEGRA) 180 MG tablet Take 180 mg by mouth daily.    Marland Kitchen gabapentin (NEURONTIN) 300 MG capsule   0  . HYDROcodone-acetaminophen (NORCO) 10-325 MG per tablet Take 1 tablet by mouth every 6 (six) hours as needed for moderate pain.     . isosorbide mononitrate (IMDUR) 30 MG 24 hr tablet Take 1 tablet (30 mg total) by mouth daily. 30 tablet 5  . lisinopril (PRINIVIL,ZESTRIL) 20 MG tablet Take 1 tablet (20 mg total) by mouth daily. 90 tablet 2  . loperamide (IMODIUM)  2 MG capsule Take 2 mg by mouth daily.    . nitroGLYCERIN (NITROSTAT) 0.4 MG SL tablet Place 1 tablet (0.4 mg total) under the tongue every 5 (five) minutes x 3 doses as needed for chest pain. 25 tablet 12  . pantoprazole (PROTONIX) 40 MG tablet Take 1 tablet (40 mg total) by mouth daily at 12 noon. 90 tablet 0  . Probiotic Product (TRUNATURE DIGESTIVE PROBIOTIC) CAPS Take 1 capsule by mouth daily.     No current facility-administered medications for this visit.    Allergies:    Allergies  Allergen Reactions  . Corticosteroids Swelling    Swelling of lips  . Prednisone     Swollen lips  . Iohexol Palpitations    Social  History:  The patient  reports that he has never smoked. He does not have any smokeless tobacco history on file. He reports that he does not drink alcohol or use illicit drugs.   ROS:  Please see the history of present illness.   Positive for arthritis, left finger pain, no chest pain, no shortness of breath.    PHYSICAL EXAM: VS:  BP 130/72 mmHg  Pulse 82  Ht 5\' 10"  (1.778 m)  Wt 182 lb (82.555 kg)  BMI 26.11 kg/m2 Well nourished, well developed, in no acute distress HEENT: normal Neck: no JVD Cardiac:  normal S1, S2; RRR; no murmur Lungs:  clear to auscultation bilaterally, no wheezing, rhonchi or rales Abd: soft, nontender, no hepatomegaly Ext: no edema Skin: warm and dry Neuro: no focal abnormalities noted  EKG:  None today  Labs: 10/14-LDL 51, creatinine 1.3    ASSESSMENT AND PLAN:  1. Coronary artery disease-we have restarted Plavix and will continue isosorbide. Currently doing well. No symptoms. He asked if we need to increase the isosorbide dose. I do not think that this is necessary at this point.  2. Sinusitis-it would be reasonable for him to use 1-2 days of Afrin to help clear his eustachian tube. I expressed to him that he should not use this on a prolonged basis secondary to rebound effect and secondary to his underlying coronary disease. 3. Hyperlipidemia-on atorvastatin 40. LDL goal less than 70. LDL 51. 4. Chronic kidney disease stage II-he is trying to avoid NSAIDs. May need other therapeutic options for his arthritis. Getting epidural injections for his back. 5. Hypertension-normal today. At home usually in the 373 systolic range. No changes made. 6. Six-month follow-up  Signed, Candee Furbish, MD [facility]  09/16/2014 9:52 AM

## 2014-10-27 ENCOUNTER — Other Ambulatory Visit: Payer: Self-pay | Admitting: *Deleted

## 2014-10-27 MED ORDER — PANTOPRAZOLE SODIUM 40 MG PO TBEC: 40.0000 mg | DELAYED_RELEASE_TABLET | Freq: Every day | ORAL | Status: DC

## 2015-03-02 ENCOUNTER — Encounter: Payer: Self-pay | Admitting: Cardiology

## 2015-03-02 ENCOUNTER — Ambulatory Visit (INDEPENDENT_AMBULATORY_CARE_PROVIDER_SITE_OTHER): Payer: BLUE CROSS/BLUE SHIELD | Admitting: Cardiology

## 2015-03-02 VITALS — BP 100/60 | HR 75 | Ht 70.0 in | Wt 180.8 lb

## 2015-03-02 DIAGNOSIS — E782 Mixed hyperlipidemia: Secondary | ICD-10-CM

## 2015-03-02 DIAGNOSIS — I10 Essential (primary) hypertension: Secondary | ICD-10-CM | POA: Diagnosis not present

## 2015-03-02 DIAGNOSIS — E7800 Pure hypercholesterolemia, unspecified: Secondary | ICD-10-CM

## 2015-03-02 DIAGNOSIS — I2510 Atherosclerotic heart disease of native coronary artery without angina pectoris: Secondary | ICD-10-CM | POA: Diagnosis not present

## 2015-03-02 MED ORDER — LISINOPRIL 10 MG PO TABS: 10.0000 mg | ORAL_TABLET | Freq: Every day | ORAL | Status: DC

## 2015-03-02 NOTE — Patient Instructions (Addendum)
Medication Instructions:  Please decrease your Lisinopril to 10 mg a day. Continue all other medications as listed.  Follow-Up: Follow up in 1 year with Dr. Marlou Porch.  You will receive a letter in the mail 2 months before you are due.  Please call us when you receive this letter to schedule your follow up appointment.  Thank you for choosing [facility]!!

## 2015-03-02 NOTE — Progress Notes (Signed)
Tom Goodman. [address], [address] [place], [place]  [address] Phone: [phone] Fax:  [phone]  Date:  03/02/2015   ID:  Tom Goodman, DOB 04-11-1951, MRN [id number]  PCP:  Tom Hams, MD   History of Present Illness: Tom Goodman is a 64 y.o. male with coronary artery disease, DES to obtuse marginal one on 03/09/09 (occluded), DES to posterior lateral branch on 03/22/09 by Dr. Leonia Goodman, nuclear stress test 2013 which was low risk, with hypertension, hyperlipidemia here for followup. He was hospitalized on 11/22/13 with symptoms of feeling weak, pale appearance while at church. These are similar symptoms to his prior stenting. He underwent cardiac catheterization which showed occluded obtuse marginal stent, 50% RCA, patent DES in the posterior lateral branch. Collateral blood flow was noted to obtuse marginal. Normal EF. He was started on isosorbide. Troponin was negative.  Recently was at [facility] because of sinus infection, hypotension blood pressure 70 at urgent care. Antibiotics were given. EKG was done. Normal per patient. 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  I take care of his wife Tom Goodman.  03/02/15-doing very well, no chest pain, no syncope, no shortness of breath. Still working. [employer]. Flip phone.  Wt Readings from Last 3 Encounters:  03/02/15 180 lb 12.8 oz (82.01 kg)  09/16/14 182 lb (82.555 kg)  12/15/13 184 lb (83.462 kg)     Past Medical History  Diagnosis Date  . Coronary atherosclerosis of native coronary artery     DES to OM1 on 03/09/09, DES to PL branch on 03/22/09 by Dr. Leonia Goodman, nuclear stress test 2013 - low risk,  . Essential hypertension, benign   . Intermediate coronary syndrome   . Mixed hyperlipidemia   . Epigastric abdominal pain   . Sleep apnea   . Hypercholesteremia   . GERD (gastroesophageal reflux disease)   . Hypercholesteremia   .  Osteoarthritis   . Chest pain   . Heart murmur   . Chronic kidney disease     Past Surgical History  Procedure Laterality Date  . Cardiac catheterization  2010    Staged PCI of the second vessel because of renal problems  . Bilateral thumb surgery dr Tom Goodman    . Shoulder arthroscopy  2012    Left Feb, Right Sept  . Left heart catheterization with coronary angiogram N/A 11/23/2013    Procedure: LEFT HEART CATHETERIZATION WITH CORONARY ANGIOGRAM;  Surgeon: Tom Booze, MD;  Location: [facility] CATH LAB;  Service: Cardiovascular;  Laterality: N/A;    Current Outpatient Prescriptions  Medication Sig Dispense Refill  . aspirin 81 MG tablet Take 81 mg by mouth daily.     Marland Kitchen atorvastatin (LIPITOR) 40 MG tablet Take 1 tablet (40 mg total) by mouth daily. 90 tablet 3  . citalopram (CELEXA) 20 MG tablet Take 20 mg by mouth daily.     . clopidogrel (PLAVIX) 75 MG tablet Take 1 tablet (75 mg total) by mouth daily. 90 tablet 3  . fexofenadine (ALLEGRA) 180 MG tablet Take 180 mg by mouth as needed for allergies.     Marland Kitchen gabapentin (NEURONTIN) 300 MG capsule Take 300 mg by mouth 2 (two) times daily.   0  . HYDROcodone-acetaminophen (NORCO) 10-325 MG per tablet Take 1 tablet by mouth every 6 (six) hours as needed for moderate pain.     . isosorbide mononitrate (IMDUR) 30 MG 24 hr tablet Take 1 tablet (  30 mg total) by mouth daily. 90 tablet 3  . lisinopril (PRINIVIL,ZESTRIL) 20 MG tablet Take 1 tablet (20 mg total) by mouth daily. 90 tablet 3  . loperamide (IMODIUM) 2 MG capsule Take 2 mg by mouth daily.    . nitroGLYCERIN (NITROSTAT) 0.4 MG SL tablet Place 1 tablet (0.4 mg total) under the tongue every 5 (five) minutes x 3 doses as needed for chest pain. 25 tablet 12  . pantoprazole (PROTONIX) 40 MG tablet Take 1 tablet (40 mg total) by mouth daily at 12 noon. 90 tablet 1  . Probiotic Product (TRUNATURE DIGESTIVE PROBIOTIC) CAPS Take 1 capsule by mouth daily.     No current facility-administered  medications for this visit.    Allergies:    Allergies  Allergen Reactions  . Corticosteroids Swelling    Swelling of lips  . Prednisone     Swollen lips  . Iohexol Palpitations    Social History:  The patient  reports that he has never smoked. He does not have any smokeless tobacco history on file. He reports that he does not drink alcohol or use illicit drugs.   ROS:  Please see the history of present illness.   Positive for arthritis, left finger pain, no chest pain, no shortness of breath.    PHYSICAL EXAM: VS:  BP 100/60 mmHg  Pulse 75  Ht 5\' 10"  (1.778 m)  Wt 180 lb 12.8 oz (82.01 kg)  BMI 25.94 kg/m2 Well nourished, well developed, in no acute distress HEENT: normal Neck: no JVD Cardiac:  normal S1, S2; RRR; no murmur Lungs:  clear to auscultation bilaterally, no wheezing, rhonchi or rales Abd: soft, nontender, no hepatomegaly Ext: no edema Skin: warm and dry Neuro: no focal abnormalities noted  EKG: Today 03/02/15-normal rhythm, 75, no other abnormalities.  Labs: 10/14-LDL 51, creatinine 1.3    ASSESSMENT AND PLAN:  1. Coronary artery disease- Plavix and continue isosorbide. Currently doing well. No symptoms.   2. Hyperlipidemia-on atorvastatin 40. LDL goal less than 70. LDL 51. 3. Chronic kidney disease stage II-he is trying to avoid NSAIDs. May need other therapeutic options for his arthritis. Getting epidural injections for his back. 4. Low today. He has had some dizziness at times. Occasionally his blood pressure will run in the 100 range. I would like to decrease his lisinopril to 10 mg. No changes made. 5. One year follow-up  Signed, Tom Furbish, MD [facility]  03/02/2015 3:13 PM

## 2015-03-03 ENCOUNTER — Other Ambulatory Visit: Payer: Self-pay | Admitting: Gastroenterology

## 2015-03-03 ENCOUNTER — Encounter (HOSPITAL_COMMUNITY): Payer: Self-pay | Admitting: *Deleted

## 2015-03-08 ENCOUNTER — Ambulatory Visit (HOSPITAL_COMMUNITY): Payer: BLUE CROSS/BLUE SHIELD | Admitting: Anesthesiology

## 2015-03-08 ENCOUNTER — Encounter (HOSPITAL_COMMUNITY)
Admission: RE | Disposition: A | Discharge status: Home / Self Care | Payer: Self-pay | Source: Ambulatory Visit | Attending: Gastroenterology

## 2015-03-08 ENCOUNTER — Encounter (HOSPITAL_COMMUNITY): Payer: Self-pay | Admitting: *Deleted

## 2015-03-08 ENCOUNTER — Ambulatory Visit (HOSPITAL_COMMUNITY)
Admission: RE | Admit: 2015-03-08 | Discharge: 2015-03-08 | Disposition: A | Discharge status: Home / Self Care | Payer: BLUE CROSS/BLUE SHIELD | Source: Ambulatory Visit | Attending: Gastroenterology | Admitting: Gastroenterology

## 2015-03-08 DIAGNOSIS — Z1211 Encounter for screening for malignant neoplasm of colon: Secondary | ICD-10-CM | POA: Diagnosis not present

## 2015-03-08 DIAGNOSIS — E780 Pure hypercholesterolemia: Secondary | ICD-10-CM | POA: Diagnosis not present

## 2015-03-08 DIAGNOSIS — Z5309 Procedure and treatment not carried out because of other contraindication: Secondary | ICD-10-CM | POA: Diagnosis not present

## 2015-03-08 DIAGNOSIS — I2510 Atherosclerotic heart disease of native coronary artery without angina pectoris: Secondary | ICD-10-CM | POA: Insufficient documentation

## 2015-03-08 DIAGNOSIS — H269 Unspecified cataract: Secondary | ICD-10-CM | POA: Diagnosis not present

## 2015-03-08 DIAGNOSIS — Z955 Presence of coronary angioplasty implant and graft: Secondary | ICD-10-CM | POA: Insufficient documentation

## 2015-03-08 DIAGNOSIS — Z7982 Long term (current) use of aspirin: Secondary | ICD-10-CM | POA: Diagnosis not present

## 2015-03-08 DIAGNOSIS — Z79899 Other long term (current) drug therapy: Secondary | ICD-10-CM | POA: Insufficient documentation

## 2015-03-08 DIAGNOSIS — Z7902 Long term (current) use of antithrombotics/antiplatelets: Secondary | ICD-10-CM | POA: Diagnosis not present

## 2015-03-08 DIAGNOSIS — I10 Essential (primary) hypertension: Secondary | ICD-10-CM | POA: Insufficient documentation

## 2015-03-08 DIAGNOSIS — G4733 Obstructive sleep apnea (adult) (pediatric): Secondary | ICD-10-CM | POA: Diagnosis not present

## 2015-03-08 SURGERY — CANCELLED PROCEDURE

## 2015-03-08 MED ORDER — PROPOFOL 10 MG/ML IV BOLUS
INTRAVENOUS | Status: AC
  Filled 2015-03-08: qty 20

## 2015-03-08 MED ORDER — SODIUM CHLORIDE 0.9 % IV SOLN: INTRAVENOUS | Status: DC

## 2015-03-08 SURGICAL SUPPLY — 21 items

## 2015-03-08 NOTE — H&P (Signed)
  Procedure: Screening colonoscopy. Normal screening colonoscopy performed on 09/14/2004. Chronic aspirin and Plavix therapy following placement of drug eluting coronary artery stent. Obstructive sleep apnea syndrome.  History: The patient is a 64 year old male born 24-Oct-1950. He is scheduled to undergo a repeat screening colonoscopy today.  Past medical history: Hypertension. Cataracts. Hypercholesterolemia. Obstructive sleep apnea syndrome. Coronary artery disease. Drug eluting coronary artery stents. Right shoulder arthroscopy. Bilateral vomiting surgery.  Medication allergies: None  Exam: The patient is alert and lying comfortably on the endoscopy stretcher. Abdomen is soft and nontender to palpation. Lungs are clear to auscultation. Cardiac exam reveals a regular rhythm.  Plan: The scheduled screening colonoscopy was canceled. The patient consumes a solid regular meal yesterday at 2 PM which included a hamburger. He did not remain on a clear liquid diet yesterday.

## 2015-04-10 ENCOUNTER — Observation Stay (HOSPITAL_COMMUNITY)
Admission: EM | Admit: 2015-04-10 | Discharge: 2015-04-11 | Disposition: A | Discharge status: Home / Self Care | Payer: BLUE CROSS/BLUE SHIELD | Attending: Internal Medicine | Admitting: Internal Medicine

## 2015-04-10 ENCOUNTER — Encounter (HOSPITAL_COMMUNITY): Payer: Self-pay | Admitting: Emergency Medicine

## 2015-04-10 ENCOUNTER — Emergency Department (HOSPITAL_COMMUNITY): Payer: BLUE CROSS/BLUE SHIELD

## 2015-04-10 DIAGNOSIS — I2510 Atherosclerotic heart disease of native coronary artery without angina pectoris: Secondary | ICD-10-CM | POA: Diagnosis not present

## 2015-04-10 DIAGNOSIS — Z7982 Long term (current) use of aspirin: Secondary | ICD-10-CM | POA: Insufficient documentation

## 2015-04-10 DIAGNOSIS — R011 Cardiac murmur, unspecified: Secondary | ICD-10-CM | POA: Insufficient documentation

## 2015-04-10 DIAGNOSIS — I129 Hypertensive chronic kidney disease with stage 1 through stage 4 chronic kidney disease, or unspecified chronic kidney disease: Secondary | ICD-10-CM | POA: Diagnosis not present

## 2015-04-10 DIAGNOSIS — E782 Mixed hyperlipidemia: Secondary | ICD-10-CM | POA: Diagnosis not present

## 2015-04-10 DIAGNOSIS — E780 Pure hypercholesterolemia: Secondary | ICD-10-CM | POA: Insufficient documentation

## 2015-04-10 DIAGNOSIS — Z7902 Long term (current) use of antithrombotics/antiplatelets: Secondary | ICD-10-CM | POA: Insufficient documentation

## 2015-04-10 DIAGNOSIS — G4730 Sleep apnea, unspecified: Secondary | ICD-10-CM | POA: Insufficient documentation

## 2015-04-10 DIAGNOSIS — R0789 Other chest pain: Secondary | ICD-10-CM | POA: Diagnosis present

## 2015-04-10 DIAGNOSIS — N189 Chronic kidney disease, unspecified: Secondary | ICD-10-CM | POA: Diagnosis not present

## 2015-04-10 DIAGNOSIS — Z9889 Other specified postprocedural states: Secondary | ICD-10-CM | POA: Diagnosis not present

## 2015-04-10 DIAGNOSIS — K219 Gastro-esophageal reflux disease without esophagitis: Secondary | ICD-10-CM | POA: Diagnosis not present

## 2015-04-10 DIAGNOSIS — R079 Chest pain, unspecified: Principal | ICD-10-CM | POA: Insufficient documentation

## 2015-04-10 DIAGNOSIS — Z79899 Other long term (current) drug therapy: Secondary | ICD-10-CM | POA: Diagnosis not present

## 2015-04-10 DIAGNOSIS — M1990 Unspecified osteoarthritis, unspecified site: Secondary | ICD-10-CM | POA: Diagnosis not present

## 2015-04-10 LAB — BASIC METABOLIC PANEL
ANION GAP: 8 (ref 5–15)
Anion gap: 6 (ref 5–15)
BUN: 15 mg/dL (ref 6–20)
BUN: 15 mg/dL (ref 6–20)
CALCIUM: 9.5 mg/dL (ref 8.9–10.3)
CHLORIDE: 103 mmol/L (ref 101–111)
CO2: 29 mmol/L (ref 22–32)
CO2: 27 mmol/L (ref 22–32)
CREATININE: 1.19 mg/dL (ref 0.61–1.24)
Calcium: 9.1 mg/dL (ref 8.9–10.3)
Chloride: 103 mmol/L (ref 101–111)
Creatinine, Ser: 1.29 mg/dL — ABNORMAL HIGH (ref 0.61–1.24)
GFR calc non Af Amer: 56 mL/min — ABNORMAL LOW (ref >60)
GFR calc non Af Amer: >60 mL/min (ref >60)
Glucose, Bld: 114 mg/dL — ABNORMAL HIGH (ref 65–99)
Glucose, Bld: 92 mg/dL (ref 65–99)
POTASSIUM: 4.8 mmol/L (ref 3.5–5.1)
Potassium: 4.1 mmol/L (ref 3.5–5.1)
SODIUM: 138 mmol/L (ref 135–145)
SODIUM: 138 mmol/L (ref 135–145)

## 2015-04-10 LAB — CBC
HCT: 43.2 % (ref 39.0–52.0)
HEMATOCRIT: 45.4 % (ref 39.0–52.0)
Hemoglobin: 14.9 g/dL (ref 13.0–17.0)
Hemoglobin: 14.2 g/dL (ref 13.0–17.0)
MCH: 29.3 pg (ref 26.0–34.0)
MCH: 29.3 pg (ref 26.0–34.0)
MCHC: 32.8 g/dL (ref 30.0–36.0)
MCHC: 32.9 g/dL (ref 30.0–36.0)
MCV: 89.2 fL (ref 78.0–100.0)
MCV: 89.3 fL (ref 78.0–100.0)
Platelets: 264 10*3/uL (ref 150–400)
Platelets: 240 10*3/uL (ref 150–400)
RBC: 5.09 MIL/uL (ref 4.22–5.81)
RBC: 4.84 MIL/uL (ref 4.22–5.81)
RDW: 12.9 % (ref 11.5–15.5)
RDW: 12.9 % (ref 11.5–15.5)
WBC: 6.3 10*3/uL (ref 4.0–10.5)
WBC: 7.6 10*3/uL (ref 4.0–10.5)

## 2015-04-10 LAB — TROPONIN I: Troponin I: <0.03 ng/mL (ref <0.031)

## 2015-04-10 LAB — I-STAT TROPONIN, ED: Troponin i, poc: 0 ng/mL (ref 0.00–0.08)

## 2015-04-10 MED ORDER — ONDANSETRON HCL 4 MG/2ML IJ SOLN: 4.0000 mg | Freq: Four times a day (QID) | INTRAMUSCULAR | Status: DC | PRN

## 2015-04-10 MED ORDER — ASPIRIN EC 81 MG PO TBEC: 81.0000 mg | DELAYED_RELEASE_TABLET | Freq: Every day | ORAL | Status: DC

## 2015-04-10 MED ORDER — GABAPENTIN 600 MG PO TABS
300.0000 mg | ORAL_TABLET | Freq: Two times a day (BID) | ORAL | Status: DC
  Administered 2015-04-10: 300 mg via ORAL
  Filled 2015-04-10: qty 1

## 2015-04-10 MED ORDER — ATORVASTATIN CALCIUM 20 MG PO TABS: 20.0000 mg | ORAL_TABLET | Freq: Every day | ORAL | Status: DC

## 2015-04-10 MED ORDER — PANTOPRAZOLE SODIUM 40 MG PO TBEC
40.0000 mg | DELAYED_RELEASE_TABLET | Freq: Every day | ORAL | Status: DC
  Administered 2015-04-11: 40 mg via ORAL
  Filled 2015-04-10: qty 1

## 2015-04-10 MED ORDER — HEPARIN SODIUM (PORCINE) 5000 UNIT/ML IJ SOLN: 5000.0000 [IU] | Freq: Three times a day (TID) | INTRAMUSCULAR | Status: DC

## 2015-04-10 MED ORDER — ACETAMINOPHEN 325 MG PO TABS: 650.0000 mg | ORAL_TABLET | ORAL | Status: DC | PRN

## 2015-04-10 MED ORDER — NITROGLYCERIN 0.4 MG SL SUBL: 0.4000 mg | SUBLINGUAL_TABLET | SUBLINGUAL | Status: DC | PRN

## 2015-04-10 MED ORDER — HYDROCODONE-ACETAMINOPHEN 10-325 MG PO TABS
1.0000 | ORAL_TABLET | Freq: Four times a day (QID) | ORAL | Status: DC | PRN
  Administered 2015-04-10: 1 via ORAL
  Filled 2015-04-10: qty 1

## 2015-04-10 MED ORDER — ASPIRIN 300 MG RE SUPP: 300.0000 mg | RECTAL | Status: DC

## 2015-04-10 MED ORDER — NITROGLYCERIN IN D5W 200-5 MCG/ML-% IV SOLN
0.0000 ug/min | Freq: Once | INTRAVENOUS | Status: AC
  Administered 2015-04-10: 5 ug/min via INTRAVENOUS
  Filled 2015-04-10: qty 250

## 2015-04-10 MED ORDER — LISINOPRIL 10 MG PO TABS: 10.0000 mg | ORAL_TABLET | Freq: Every day | ORAL | Status: DC

## 2015-04-10 MED ORDER — ASPIRIN 81 MG PO CHEW
324.0000 mg | CHEWABLE_TABLET | ORAL | Status: DC
  Filled 2015-04-10: qty 4

## 2015-04-10 MED ORDER — ISOSORBIDE MONONITRATE ER 30 MG PO TB24: 30.0000 mg | ORAL_TABLET | Freq: Every day | ORAL | Status: DC

## 2015-04-10 MED ORDER — CARVEDILOL 3.125 MG PO TABS
3.1250 mg | ORAL_TABLET | Freq: Two times a day (BID) | ORAL | Status: DC
  Administered 2015-04-11: 3.125 mg via ORAL
  Filled 2015-04-10: qty 1

## 2015-04-10 NOTE — H&P (Signed)
Tom Goodman is an 65 y.o. male.   Chief Complaint: chest pain HPI: The patient is a 64 yo man with a h/o CAD who has had a h/o back pain, who is scheduled for a spinal injection in a few days. He stopped his plavix. He has had chest pain since then. His pain is non-exertional. He has not had dyspnea or diaphoresis. No edema.  Past Medical History  Diagnosis Date  . Coronary atherosclerosis of native coronary artery     DES to OM1 on 03/09/09, DES to PL branch on 03/22/09 by Dr. Amil Amen, nuclear stress test 2013 - low risk,  . Essential hypertension, benign   . Intermediate coronary syndrome   . Mixed hyperlipidemia   . Epigastric abdominal pain   . Hypercholesteremia   . GERD (gastroesophageal reflux disease)   . Hypercholesteremia   . Chest pain   . Heart murmur   . Sleep apnea     no cpap  . Chronic kidney disease   . Osteoarthritis     left leg,back    Past Surgical History  Procedure Laterality Date  . Cardiac catheterization  2010    Staged PCI of the second vessel because of renal problems  . Bilateral thumb surgery dr Mina Marble    . Shoulder arthroscopy  2012    Left Feb, Right Sept  . Left heart catheterization with coronary angiogram N/A 11/23/2013    Procedure: LEFT HEART CATHETERIZATION WITH CORONARY ANGIOGRAM;  Surgeon: Corky Crafts, MD;  Location: [facility] CATH LAB;  Service: Cardiovascular;  Laterality: N/A;  . Vasectomy      Family History  Problem Relation Age of Onset  . Emphysema Father   . Dementia Mother   . Hypertension Brother   . CVA Sister    Social History:  reports that he has never smoked. He does not have any smokeless tobacco history on file. He reports that he does not drink alcohol or use illicit drugs.  Allergies:  Allergies  Allergen Reactions  . Corticosteroids Swelling    Swelling of lips  . Prednisone     Swollen lips  . Iohexol Palpitations     (Not in a hospital admission)  Results for orders placed or performed during  the hospital encounter of 04/10/15 (from the past 48 hour(s))  CBC     Status: None   Collection Time: 04/10/15  2:48 PM  Result Value Ref Range   WBC 6.3 4.0 - 10.5 K/uL   RBC 5.09 4.22 - 5.81 MIL/uL   Hemoglobin 14.9 13.0 - 17.0 g/dL   HCT 64.6 28.8 - 05.5 %   MCV 89.2 78.0 - 100.0 fL   MCH 29.3 26.0 - 34.0 pg   MCHC 32.8 30.0 - 36.0 g/dL   RDW 98.6 09.0 - 16.9 %   Platelets 264 150 - 400 K/uL  Basic metabolic panel     Status: Abnormal   Collection Time: 04/10/15  2:48 PM  Result Value Ref Range   Sodium 138 135 - 145 mmol/L   Potassium 4.8 3.5 - 5.1 mmol/L   Chloride 103 101 - 111 mmol/L   CO2 29 22 - 32 mmol/L   Glucose, Bld 114 (H) 65 - 99 mg/dL   BUN 15 6 - 20 mg/dL   Creatinine, Ser 8.29 (H) 0.61 - 1.24 mg/dL   Calcium 9.5 8.9 - 67.0 mg/dL   GFR calc non Af Amer 56 (L) >60 mL/min   GFR calc Af Amer >60 >60  mL/min    Comment: (NOTE) The eGFR has been calculated using the CKD EPI equation. This calculation has not been validated in all clinical situations. eGFR's persistently <60 mL/min signify possible Chronic Kidney Disease.    Anion gap 6 5 - 15  I-stat troponin, ED  (not at [facility])     Status: None   Collection Time: 04/10/15  2:57 PM  Result Value Ref Range   Troponin i, poc 0.00 0.00 - 0.08 ng/mL   Comment 3            Comment: Due to the release kinetics of cTnI, a negative result within the first hours of the onset of symptoms does not rule out myocardial infarction with certainty. If myocardial infarction is still suspected, repeat the test at appropriate intervals.    Dg Chest 2 View  04/10/2015   CLINICAL DATA:  Chest pain.  EXAM: CHEST  2 VIEW  COMPARISON:  September 10, 2014.  FINDINGS: The heart size and mediastinal contours are within normal limits. Both lungs are clear. No pneumothorax or pleural effusion is noted. Old left clavicular fracture is noted.  IMPRESSION: No active cardiopulmonary disease.   Electronically Signed   By: Marijo Conception,  M.D.   On: 04/10/2015 16:20    ROS all 14 systems reviewed and negative except as noted in the hpi and he has chronic leg pain which is neuropathic  Blood pressure 121/58, pulse 48, temperature 98.7 F (37.1 C), temperature source Oral, resp. rate 17, height $RemoveBe'5\' 10"'ctPfnjWaP$  (1.778 m), weight 177 lb 8 oz (80.513 kg), SpO2 96 %. Physical Exam  Well appearing middle aged man, NAD HEENT: Unremarkable,[place], AT Neck:  6 JVD, no thyromegally Back:  No CVA tenderness Lungs:  Clear with no wheezes, rales, or rhonchi HEART:  Regular brady rhythm, no murmurs, no rubs, no clicks Abd:  soft, positive bowel sounds, no organomegally, no rebound, no guarding Ext:  2 plus pulses, no edema, no cyanosis, no clubbing Skin:  No rashes no nodules Neuro:  CN II through XII intact, motor grossly intact  EKG: nsr  Assessment/Plan 1. Atypical chest pain 2. Known CAD 3. Arthritis/neuropathy Rec: will admit for observation. If enzymes are negative and no objective evidence of [place], then early discharge would be appropriate without stress testing  Cristopher Peru 04/10/2015, 5:27 PM

## 2015-04-10 NOTE — ED Notes (Signed)
attempted report 

## 2015-04-10 NOTE — ED Notes (Signed)
Pt reports he wants to hold off on IV placement and nitro until he is seen by the Cardiologist because he wants to go home.

## 2015-04-10 NOTE — ED Notes (Signed)
Patient transported to X-ray 

## 2015-04-10 NOTE — ED Notes (Signed)
*  Pt does not want to take his Asprin because he states he is having an epidural this week and was told to get off of his blood thinners.

## 2015-04-10 NOTE — ED Provider Notes (Signed)
CSN: [id number]     Arrival date & time 04/10/15  1436 History   First MD Initiated Contact with Patient 04/10/15 1519     Chief Complaint  Patient presents with  . Chest Pain     (Consider location/radiation/quality/duration/timing/severity/associated sxs/prior Treatment) HPI Comments: Current sx are not like his prior angina Increased weakness and fatigue noted  Patient is a 64 y.o. male presenting with chest pain. The history is provided by the patient.  Chest Pain Pain location:  Substernal area Pain quality: pressure   Pain radiates to:  Does not radiate Pain radiates to the back: no   Pain severity:  Moderate Onset quality:  Sudden Duration:  1 hour Timing:  Sporadic Progression:  Waxing and waning Chronicity:  Recurrent Relieved by:  Nothing Worsened by:  Nothing tried Ineffective treatments:  Antacids Associated symptoms: no cough, no fever, no nausea and not vomiting   Risk factors: coronary artery disease     Past Medical History  Diagnosis Date  . Coronary atherosclerosis of native coronary artery     DES to OM1 on 03/09/09, DES to PL branch on 03/22/09 by Dr. Leonia Reeves, nuclear stress test 2013 - low risk,  . Essential hypertension, benign   . Intermediate coronary syndrome   . Mixed hyperlipidemia   . Epigastric abdominal pain   . Hypercholesteremia   . GERD (gastroesophageal reflux disease)   . Hypercholesteremia   . Chest pain   . Heart murmur   . Sleep apnea     no cpap  . Chronic kidney disease   . Osteoarthritis     left leg,back   Past Surgical History  Procedure Laterality Date  . Cardiac catheterization  2010    Staged PCI of the second vessel because of renal problems  . Bilateral thumb surgery dr Burney Gauze    . Shoulder arthroscopy  2012    Left Feb, Right Sept  . Left heart catheterization with coronary angiogram N/A 11/23/2013    Procedure: LEFT HEART CATHETERIZATION WITH CORONARY ANGIOGRAM;  Surgeon: Jettie Booze, MD;  Location: [facility]  CATH LAB;  Service: Cardiovascular;  Laterality: N/A;  . Vasectomy     Family History  Problem Relation Age of Onset  . Emphysema Father   . Dementia Mother   . Hypertension Brother   . CVA Sister    History  Substance Use Topics  . Smoking status: Never Smoker   . Smokeless tobacco: Not on file  . Alcohol Use: No    Review of Systems  Constitutional: Negative for fever.  Respiratory: Negative for cough.   Cardiovascular: Positive for chest pain.  Gastrointestinal: Negative for nausea and vomiting.  All other systems reviewed and are negative.     Allergies  Corticosteroids; Prednisone; and Iohexol  Home Medications   Prior to Admission medications   Medication Sig Start Date End Date Taking? Authorizing Provider  aspirin 81 MG tablet Take 81 mg by mouth daily.     Historical Provider, MD  atorvastatin (LIPITOR) 40 MG tablet Take 1 tablet (40 mg total) by mouth daily. 09/16/14   Jerline Pain, MD  citalopram (CELEXA) 20 MG tablet Take 20 mg by mouth daily.  10/27/13   Historical Provider, MD  clopidogrel (PLAVIX) 75 MG tablet Take 1 tablet (75 mg total) by mouth daily. 09/16/14   Jerline Pain, MD  fexofenadine (ALLEGRA) 180 MG tablet Take 180 mg by mouth as needed for allergies.     Historical Provider, MD  gabapentin (NEURONTIN)  300 MG capsule Take 300 mg by mouth 2 (two) times daily.  07/22/14   Historical Provider, MD  HYDROcodone-acetaminophen (NORCO) 10-325 MG per tablet Take 1 tablet by mouth every 6 (six) hours as needed for moderate pain.  10/19/13   Historical Provider, MD  isosorbide mononitrate (IMDUR) 30 MG 24 hr tablet Take 1 tablet (30 mg total) by mouth daily. 09/16/14   Jerline Pain, MD  lisinopril (PRINIVIL,ZESTRIL) 10 MG tablet Take 1 tablet (10 mg total) by mouth daily. 03/02/15   Jerline Pain, MD  loperamide (IMODIUM) 2 MG capsule Take 2 mg by mouth daily.    Historical Provider, MD  Multiple Vitamin (MULTIVITAMIN WITH MINERALS) TABS tablet Take 1 tablet  by mouth daily.    Historical Provider, MD  nitroGLYCERIN (NITROSTAT) 0.4 MG SL tablet Place 1 tablet (0.4 mg total) under the tongue every 5 (five) minutes x 3 doses as needed for chest pain. 11/23/13   Brett Canales, PA-C  pantoprazole (PROTONIX) 40 MG tablet Take 1 tablet (40 mg total) by mouth daily at 12 noon. 10/27/14 01/20/17  Jerline Pain, MD  Probiotic Product (TRUNATURE DIGESTIVE PROBIOTIC) CAPS Take 1 capsule by mouth daily.    Historical Provider, MD   BP 118/67 mmHg  Pulse 71  Temp(Src) 98.7 F (37.1 C) (Oral)  Resp 17  Ht 5\' 10"  (1.778 m)  Wt 177 lb 8 oz (80.513 kg)  BMI 25.47 kg/m2  SpO2 96% Physical Exam  Constitutional: He is oriented to person, place, and time. He appears well-developed and well-nourished.  Non-toxic appearance. No distress.  HENT:  Head: Normocephalic and atraumatic.  Eyes: Conjunctivae, EOM and lids are normal. Pupils are equal, round, and reactive to light.  Neck: Normal range of motion. Neck supple. No tracheal deviation present. No thyroid mass present.  Cardiovascular: Normal rate, regular rhythm and normal heart sounds.  Exam reveals no gallop.   No murmur heard. Pulmonary/Chest: Effort normal and breath sounds normal. No stridor. No respiratory distress. He has no decreased breath sounds. He has no wheezes. He has no rhonchi. He has no rales.  Abdominal: Soft. Normal appearance and bowel sounds are normal. He exhibits no distension. There is no tenderness. There is no rebound and no CVA tenderness.  Musculoskeletal: Normal range of motion. He exhibits no edema or tenderness.  Neurological: He is alert and oriented to person, place, and time. He has normal strength. No cranial nerve deficit or sensory deficit. GCS eye subscore is 4. GCS verbal subscore is 5. GCS motor subscore is 6.  Skin: Skin is warm and dry. No abrasion and no rash noted.  Psychiatric: He has a normal mood and affect. His speech is normal and behavior is normal.  Nursing note and  vitals reviewed.   ED Course  Procedures (including critical care time) Labs Review Labs Reviewed  [place], ED    Imaging Review No results found.   EKG Interpretation   Date/Time:  Sunday April 10 2015 14:42:44 EDT Ventricular Rate:  66 PR Interval:  174 QRS Duration: 88 QT Interval:  388 QTC Calculation: 406 R Axis:   74 Text Interpretation:  Normal sinus rhythm Normal ECG No significant change  since last tracing Confirmed by Cynda Soule  MD, Idy Rawling ([id number]) on 04/10/2015  3:20:52 PM      MDM   Final diagnoses:  Chest pain    Pt to be eval by cards for admission    Lacretia Leigh,  MD 04/12/15 1129

## 2015-04-10 NOTE — ED Notes (Signed)
Pt. Stated, Tom Goodman been having chest pain for 3 days it comes and goes, its like a pressure.  Wife stated, he had to stop his Plavix because hes having a epidural on Thursday for a bad back.

## 2015-04-11 DIAGNOSIS — N189 Chronic kidney disease, unspecified: Secondary | ICD-10-CM | POA: Diagnosis not present

## 2015-04-11 DIAGNOSIS — R011 Cardiac murmur, unspecified: Secondary | ICD-10-CM | POA: Diagnosis not present

## 2015-04-11 DIAGNOSIS — R0789 Other chest pain: Secondary | ICD-10-CM | POA: Diagnosis not present

## 2015-04-11 DIAGNOSIS — I2510 Atherosclerotic heart disease of native coronary artery without angina pectoris: Secondary | ICD-10-CM | POA: Diagnosis not present

## 2015-04-11 DIAGNOSIS — R079 Chest pain, unspecified: Secondary | ICD-10-CM | POA: Diagnosis not present

## 2015-04-11 LAB — BASIC METABOLIC PANEL
Anion gap: 9 (ref 5–15)
BUN: 18 mg/dL (ref 6–20)
CO2: 24 mmol/L (ref 22–32)
CREATININE: 1.19 mg/dL (ref 0.61–1.24)
Calcium: 8.5 mg/dL — ABNORMAL LOW (ref 8.9–10.3)
Chloride: 104 mmol/L (ref 101–111)
GFR calc Af Amer: >60 mL/min (ref >60)
GLUCOSE: 97 mg/dL (ref 65–99)
POTASSIUM: 4.2 mmol/L (ref 3.5–5.1)
Sodium: 137 mmol/L (ref 135–145)

## 2015-04-11 LAB — TROPONIN I: Troponin I: <0.03 ng/mL (ref <0.031)

## 2015-04-11 NOTE — Discharge Summary (Signed)
Discharge Summary   Patient ID: Tom Goodman,  MRN: [id number], DOB/AGE: 1950-10-25 64 y.o.  Admit date: 04/10/2015 Discharge date: 04/11/2015  Primary Care Provider: Seward Carol D Primary Cardiologist: Dr. Marlou Porch  Discharge Diagnoses Active Problems:   Chest pressure   Allergies Allergies  Allergen Reactions  . Iohexol Palpitations  . Corticosteroids Swelling    Swelling of lips  . Prednisone Swelling    Swollen lips    Procedures: None  History of Present Illness  The patient is a 64 yo man with a h/o CAD DES to obtuse marginal one on 03/09/09 (occluded), DES to posterior lateral branch on 03/22/09 by Dr. Leonia Reeves, nuclear stress test 2013 which was low risk, with hypertension, hyperlipidemia and low back pain presented 7/10 with 3 day hx of intermittent chest pain.    He scheduled for a spinal injection 7/14 for pinched nerve. He stopped his Plavix 7/7. He has had chest pain since then. His pain is non-exertional. He has not had dyspnea or diaphoresis. No edema. Patient had a similar procedure twice in past, both times plavix stopped one week prior to that and resume on same or next day following procedure. This is the first episode of chest pain. POC trop and EKG was normal.   Hospital Course  Patient was admitted for overnight observation. Home meds were continued with addition of Coreg 3.125mg  BID. Tele and repeat EKG showed sinus bradycardia at rate of 50-60s. Asymptomatic. No further chest pain. Trop x 3 negative. His creatinine improved from 1.29 to 1.19 in AM. Patient stated that epigastric chest pain is not similar to prior cardiac pain and more related to GERD. He walked in hallway without any discomfort and he discharged stable with plan to f/u in clinic within 4-6 weeks. His Coreg was discontinued due to asymptomatics bradycardia.  He advised to continue home meds.    Discharge Vitals Blood pressure 110/64, pulse 61, temperature 98.2 F (36.8 C), temperature  source Oral, resp. rate 16, height 5\' 10"  (1.778 m), weight 173 lb 4.8 oz (78.608 kg), SpO2 97 %.  Filed Weights   04/10/15 1447 04/11/15 0500  Weight: 177 lb 8 oz (80.513 kg) 173 lb 4.8 oz (78.608 kg)    Labs  CBC  Recent Labs  04/10/15 1448 04/10/15 1901  WBC 6.3 7.6  HGB 14.9 14.2  HCT 45.4 43.2  MCV 89.2 89.3  PLT 264 449   Basic Metabolic Panel  Recent Labs  04/10/15 1901 04/11/15 0558  NA 138 137  K 4.1 4.2  CL 103 104  CO2 27 24  GLUCOSE 92 97  BUN 15 18  CREATININE 1.19 1.19  CALCIUM 9.1 8.5*   Cardiac Enzymes  Recent Labs  04/10/15 1901 04/10/15 2358 04/11/15 0558  TROPONINI <0.03 <0.03 <0.03    Disposition  Pt is being discharged home today in good condition.  Follow-up Plans & Appointments  Follow-up Information    Follow up with Truitt Merle, NP On 05/23/2015.   Specialties:  Nurse Practitioner, Interventional Cardiology, Cardiology, Radiology   Why:  @1 :30 for post hospital   Contact information:   [address] [place] [address] [phone]       Discharge Instructions    Diet - low sodium heart healthy    Complete by:  As directed      Discharge instructions    Complete by:  As directed   Call cardiology office if worsening substernal chest pressure     Increase activity slowly  Complete by:  As directed            F/u Labs/Studies: None  Discharge Medications    Medication List    TAKE these medications        aspirin 81 MG tablet  Take 81 mg by mouth daily.     atorvastatin 40 MG tablet  Commonly known as:  LIPITOR  Take 1 tablet (40 mg total) by mouth daily.     citalopram 20 MG tablet  Commonly known as:  CELEXA  Take 20 mg by mouth daily.     clopidogrel 75 MG tablet  Commonly known as:  PLAVIX  Take 1 tablet (75 mg total) by mouth daily.     gabapentin 300 MG capsule  Commonly known as:  NEURONTIN  Take 300 mg by mouth 2 (two) times daily.     HYDROcodone-acetaminophen  10-325 MG per tablet  Commonly known as:  NORCO  Take 1 tablet by mouth every 6 (six) hours as needed for moderate pain.     isosorbide mononitrate 30 MG 24 hr tablet  Commonly known as:  IMDUR  Take 1 tablet (30 mg total) by mouth daily.     lisinopril 10 MG tablet  Commonly known as:  PRINIVIL,ZESTRIL  Take 1 tablet (10 mg total) by mouth daily.     loperamide 2 MG capsule  Commonly known as:  IMODIUM  Take 2 mg by mouth daily.     multivitamin with minerals Tabs tablet  Take 1 tablet by mouth daily.     nitroGLYCERIN 0.4 MG SL tablet  Commonly known as:  NITROSTAT  Place 1 tablet (0.4 mg total) under the tongue every 5 (five) minutes x 3 doses as needed for chest pain.     pantoprazole 40 MG tablet  Commonly known as:  PROTONIX  Take 1 tablet (40 mg total) by mouth daily at 12 noon.     TRUNATURE DIGESTIVE PROBIOTIC Caps  Take 1 capsule by mouth daily.        Duration of Discharge Encounter   Greater than 30 minutes including physician time.  Signed, Irvan Tiedt PA-C 04/11/2015, 9:35 AM

## 2015-04-11 NOTE — Progress Notes (Signed)
Patient Name: Tom Goodman Date of Encounter: 04/11/2015  Active Problems:   Chest pressure  SUBJECTIVE  No further chest pain. Denies sob or palpitation. Last Plavix dose Thursday 04/07/15, stopped due to schedule spinal injection 7/14 for pinched nerve. Patient had a similar procedure twice in past, both times plavix stopped one week prior to that and resume on same or next day following procedure.   CURRENT MEDS . aspirin  324 mg Oral NOW   Or  . aspirin  300 mg Rectal NOW  . aspirin EC  81 mg Oral Daily  . atorvastatin  20 mg Oral q1800  . carvedilol  3.125 mg Oral BID WC  . gabapentin  300 mg Oral BID  . heparin  5,000 Units Subcutaneous 3 times per day  . isosorbide mononitrate  30 mg Oral Daily  . lisinopril  10 mg Oral Daily  . pantoprazole  40 mg Oral Q0600    OBJECTIVE  Filed Vitals:   04/10/15 2015 04/10/15 2300 04/11/15 0400 04/11/15 0500  BP: 145/83 130/64 133/65   Pulse: 57 57 56   Temp: 98.3 F (36.8 C)  98 F (36.7 C)   TempSrc: Oral     Resp: 15  18   Height:      Weight:    173 lb 4.8 oz (78.608 kg)  SpO2: 99%  97%     Intake/Output Summary (Last 24 hours) at 04/11/15 0717 Last data filed at 04/11/15 0400  Gross per 24 hour  Intake    240 ml  Output    100 ml  Net    140 ml   Filed Weights   04/10/15 1447 04/11/15 0500  Weight: 177 lb 8 oz (80.513 kg) 173 lb 4.8 oz (78.608 kg)    PHYSICAL EXAM  General: Pleasant, NAD. Neuro: Alert and oriented X 3. Moves all extremities spontaneously. Psych: Normal affect. HEENT:  Normal  Neck: Supple without bruits or JVD. Lungs:  Resp regular and unlabored, CTA. Heart: RRR no s3, s4, or murmurs. Abdomen: Soft, non-tender, non-distended, BS + x 4.  Extremities: No clubbing, cyanosis or edema. DP/PT/Radials 2+ and equal bilaterally.  Accessory Clinical Findings  CBC  Recent Labs  04/10/15 1448 04/10/15 1901  WBC 6.3 7.6  HGB 14.9 14.2  HCT 45.4 43.2  MCV 89.2 89.3  PLT 264 342   Basic  Metabolic Panel  Recent Labs  04/10/15 1901 04/11/15 0558  NA 138 137  K 4.1 4.2  CL 103 104  CO2 27 24  GLUCOSE 92 97  BUN 15 18  CREATININE 1.19 1.19  CALCIUM 9.1 8.5*   Cardiac Enzymes  Recent Labs  04/10/15 1901 04/10/15 2358 04/11/15 0558  TROPONINI <0.03 <0.03 <0.03    TELE  Sinus bradycardia at rate of 50-60s.   Radiology/Studies  Dg Chest 2 View  04/10/2015   CLINICAL DATA:  Chest pain.  EXAM: CHEST  2 VIEW  COMPARISON:  September 10, 2014.  FINDINGS: The heart size and mediastinal contours are within normal limits. Both lungs are clear. No pneumothorax or pleural effusion is noted. Old left clavicular fracture is noted.  IMPRESSION: No active cardiopulmonary disease.   Electronically Signed   By: Marijo Conception, M.D.   On: 04/10/2015 16:20    ASSESSMENT AND PLAN    1. Atypical chest pain  - Hx of CAD - Trop x 3 negative, lytes normal, repeat EKG showed sinus bradycardia rate of 55. Tele showed sinus bradycardia at rate  of 50-60s.  - stopped plavix 7/7 for spinal injection 7/14. Had similar procedure in past, however this is the only time patient had a chest pain. Patient this substernal/epigastric chest pressure/pain is not similar to prior cardiac pain and thinks more related to GERD.  - Last seen by Dr. Marlou Porch 6/1. At that time her lisinopril 10mg  due to low SBP of 100 and advised to f/u in one year.  - Patient is NPO, will keep until clear by MD.  - Continue ASA, BB, ACE, statin, imdur and plavix as schedule  2. Acute on chronic kidney disease - creatinine improved to 1.19 from 1.29  3. Sinus Bradycardia - Asymptomatic. On Coreg 3.125mg  BID. Continue to monitor.   Signed, Leanor Kail PA-C Pager [phone]  Patient seen and examined  Agree with findings of B Bhagat above Currently notes slight epigastric discomfort after eating   Exam unremarkable  Chest nontender Will ambulate  Follow   If OK plan to d/c today with outpt follow up  I do not  think discomfort is cardiac  Dorris Carnes

## 2015-04-14 ENCOUNTER — Other Ambulatory Visit: Payer: Self-pay | Admitting: *Deleted

## 2015-04-14 MED ORDER — NITROGLYCERIN 0.4 MG SL SUBL: 0.4000 mg | SUBLINGUAL_TABLET | SUBLINGUAL | Status: AC | PRN

## 2015-05-23 ENCOUNTER — Encounter: Payer: BLUE CROSS/BLUE SHIELD | Admitting: Nurse Practitioner

## 2015-05-30 ENCOUNTER — Encounter: Payer: BLUE CROSS/BLUE SHIELD | Admitting: Physician Assistant

## 2015-06-08 ENCOUNTER — Ambulatory Visit (INDEPENDENT_AMBULATORY_CARE_PROVIDER_SITE_OTHER): Payer: BLUE CROSS/BLUE SHIELD | Admitting: Cardiology

## 2015-06-08 ENCOUNTER — Encounter: Payer: Self-pay | Admitting: Cardiology

## 2015-06-08 VITALS — BP 100/60 | HR 69 | Ht 70.0 in | Wt 181.0 lb

## 2015-06-08 DIAGNOSIS — I2510 Atherosclerotic heart disease of native coronary artery without angina pectoris: Secondary | ICD-10-CM | POA: Diagnosis not present

## 2015-06-08 NOTE — Progress Notes (Signed)
06/08/2015 Tom Goodman   1951-07-01  [id number]  Primary Physician Kandice Hams, MD Primary Cardiologist: Dr. Marlou Porch   Reason for Visit/CC: Post hospital follow-up for chest pain  HPI:  The patient is a 64 year old male, followed by Dr. Marlou Porch, who presents to clinic today for post hospital follow-up after being evaluated for chest pain. He has a known h/o CAD, s/p  DES to the OM1 on 03/09/09 (occluded), and DES to posterior lateral branch on 03/22/09 by Dr. Leonia Reeves. He had a nuclear stress test in 2013 which was low risk. His history is also notable for hypertension, hyperlipidemia and low back pain. He presented to [facility] on 7/10 with a 3 day hx of intermittent chest pain. Apparently, he was scheduled for a spinal injection 7/14 for a pinched nerve and stopped his Plavix on 7/7. This was followed by the development of substernal/epigastric pain, although he reports it was different from his previous angina. However for reassurance he presented to the ED for evaluation. He was admitted for observation. His EKGs remained unremarkable and cardiac enzymes were negative 3. He had no exertional chest pain and was discharged home.    He presents to clinic today for post hospital follow-up. He is now back on all of his medications including Plavix. He denies any recurrent chest discomfort since discharge from the hospital. He has had no exertional chest pain or dyspnea. He has been fully compliant with his medications. EKG today demonstrates normal sinus rhythm without any ischemic abnormalities. Hear rate and blood pressure are both well-controlled.     Current Outpatient Prescriptions  Medication Sig Dispense Refill  . aspirin 81 MG tablet Take 81 mg by mouth daily.     Marland Kitchen atorvastatin (LIPITOR) 40 MG tablet Take 1 tablet (40 mg total) by mouth daily. 90 tablet 3  . citalopram (CELEXA) 20 MG tablet Take 20 mg by mouth daily.     . clopidogrel (PLAVIX) 75 MG tablet Take 1 tablet (75 mg total) by  mouth daily. 90 tablet 3  . gabapentin (NEURONTIN) 300 MG capsule Take 300 mg by mouth 2 (two) times daily.   0  . HYDROcodone-acetaminophen (NORCO) 10-325 MG per tablet Take 1 tablet by mouth every 6 (six) hours as needed for moderate pain.     . isosorbide mononitrate (IMDUR) 30 MG 24 hr tablet Take 1 tablet (30 mg total) by mouth daily. 90 tablet 3  . lisinopril (PRINIVIL,ZESTRIL) 10 MG tablet Take 10 mg by mouth daily.    Marland Kitchen loperamide (IMODIUM) 2 MG capsule Take 2 mg by mouth daily.    . Multiple Vitamin (MULTIVITAMIN WITH MINERALS) TABS tablet Take 1 tablet by mouth daily.    . nitroGLYCERIN (NITROSTAT) 0.4 MG SL tablet Place 1 tablet (0.4 mg total) under the tongue every 5 (five) minutes x 3 doses as needed for chest pain. 25 tablet 10  . pantoprazole (PROTONIX) 40 MG tablet Take 1 tablet (40 mg total) by mouth daily at 12 noon. 90 tablet 1  . Probiotic Product (TRUNATURE DIGESTIVE PROBIOTIC) CAPS Take 1 capsule by mouth daily.     No current facility-administered medications for this visit.    Allergies  Allergen Reactions  . Iohexol Palpitations  . Corticosteroids Swelling    Swelling of lips  . Prednisone Swelling    Swollen lips    Social History   Social History  . Marital Status: Married    Spouse Name: N/A  . Number of Children: N/A  . Years of  Education: N/A   Occupational History  . 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 Smoking status: Never Smoker   . Smokeless tobacco: Not on file  . Alcohol Use: No  . Drug Use: No  . Sexual Activity: Yes    Birth Control/ Protection: None   Other Topics Concern  . Not on file   Social History Narrative   Pt lives with wife, walks regularly.     Review of Systems: General: negative for chills, fever, night sweats or weight changes.  Cardiovascular: negative for chest pain, dyspnea on exertion, edema, orthopnea, palpitations, paroxysmal nocturnal dyspnea or shortness of breath Dermatological: negative  for rash Respiratory: negative for cough or wheezing Urologic: negative for hematuria Abdominal: negative for nausea, vomiting, diarrhea, bright red blood per rectum, melena, or hematemesis Neurologic: negative for visual changes, syncope, or dizziness All other systems reviewed and are otherwise negative except as noted above.    Blood pressure 100/60, pulse 69, height 5\' 10"  (1.778 m), weight 181 lb (82.101 kg).  General appearance: alert, cooperative and no distress Neck: no carotid bruit and no JVD Lungs: clear to auscultation bilaterally Heart: regular rate and rhythm, S1, S2 normal, no murmur, click, rub or gallop Extremities: no LEE Pulses: 2+ and symmetric Skin: warm and dry Neurologic: Grossly normal  EKG NSR. No ischemia. 69 bpm.   ASSESSMENT AND PLAN:   1. CAD: no recurrent CP. Continue ASA, Plavix, Imdur and lisinopril. No BB due to h/o bradycardia.   2. HTN: BP is well controlled on current regimen.  3. HLD: on statin therapy with Lipitor.     PLAN  Patient appears to be doing well from a cardiac standpoint. No recurrent CP. Continue medical thearpy. He will be due back for his yearly y/u with Dr. Marlou Porch June 2017.   Lyda Jester PA-C 06/08/2015 4:06 PM

## 2015-06-08 NOTE — Patient Instructions (Signed)
Medication Instructions:  Your physician recommends that you continue on your current medications as directed. Please refer to the Current Medication list given to you today.   Labwork: NONE  Testing/Procedures: NONE  Follow-Up: Your physician wants you to follow-up in: 9 month with Dr. Marlou Porch. You will receive a reminder letter in the mail two months in advance. If you don't receive a letter, please call our office to schedule the follow-up appointment.   Any Other Special Instructions Will Be Listed Below (If Applicable).

## 2015-08-29 ENCOUNTER — Other Ambulatory Visit: Payer: Self-pay | Admitting: Gastroenterology

## 2015-10-06 ENCOUNTER — Other Ambulatory Visit: Payer: Self-pay | Admitting: Orthopaedic Surgery

## 2015-10-12 ENCOUNTER — Other Ambulatory Visit (HOSPITAL_COMMUNITY): Payer: Self-pay | Admitting: *Deleted

## 2015-10-12 ENCOUNTER — Encounter (HOSPITAL_COMMUNITY)
Admission: RE | Admit: 2015-10-12 | Discharge: 2015-10-12 | Disposition: A | Discharge status: Home / Self Care | Payer: BLUE CROSS/BLUE SHIELD | Source: Ambulatory Visit | Attending: Orthopaedic Surgery | Admitting: Orthopaedic Surgery

## 2015-10-12 ENCOUNTER — Encounter (HOSPITAL_COMMUNITY): Payer: Self-pay

## 2015-10-12 DIAGNOSIS — Z01812 Encounter for preprocedural laboratory examination: Secondary | ICD-10-CM | POA: Insufficient documentation

## 2015-10-12 DIAGNOSIS — K219 Gastro-esophageal reflux disease without esophagitis: Secondary | ICD-10-CM | POA: Diagnosis not present

## 2015-10-12 DIAGNOSIS — G4733 Obstructive sleep apnea (adult) (pediatric): Secondary | ICD-10-CM | POA: Diagnosis not present

## 2015-10-12 DIAGNOSIS — Z955 Presence of coronary angioplasty implant and graft: Secondary | ICD-10-CM | POA: Insufficient documentation

## 2015-10-12 DIAGNOSIS — Z7982 Long term (current) use of aspirin: Secondary | ICD-10-CM | POA: Insufficient documentation

## 2015-10-12 DIAGNOSIS — I2510 Atherosclerotic heart disease of native coronary artery without angina pectoris: Secondary | ICD-10-CM | POA: Diagnosis not present

## 2015-10-12 DIAGNOSIS — Z0183 Encounter for blood typing: Secondary | ICD-10-CM | POA: Insufficient documentation

## 2015-10-12 DIAGNOSIS — Z01818 Encounter for other preprocedural examination: Secondary | ICD-10-CM | POA: Diagnosis not present

## 2015-10-12 DIAGNOSIS — Z79899 Other long term (current) drug therapy: Secondary | ICD-10-CM | POA: Insufficient documentation

## 2015-10-12 DIAGNOSIS — M1612 Unilateral primary osteoarthritis, left hip: Secondary | ICD-10-CM | POA: Diagnosis not present

## 2015-10-12 DIAGNOSIS — I129 Hypertensive chronic kidney disease with stage 1 through stage 4 chronic kidney disease, or unspecified chronic kidney disease: Secondary | ICD-10-CM | POA: Diagnosis not present

## 2015-10-12 DIAGNOSIS — Z7902 Long term (current) use of antithrombotics/antiplatelets: Secondary | ICD-10-CM | POA: Diagnosis not present

## 2015-10-12 DIAGNOSIS — N183 Chronic kidney disease, stage 3 (moderate): Secondary | ICD-10-CM | POA: Insufficient documentation

## 2015-10-12 DIAGNOSIS — E785 Hyperlipidemia, unspecified: Secondary | ICD-10-CM | POA: Diagnosis not present

## 2015-10-12 HISTORY — DX: Calculus of kidney: N20.0

## 2015-10-12 HISTORY — DX: Unspecified cataract: H26.9

## 2015-10-12 HISTORY — DX: Functional diarrhea: K59.1

## 2015-10-12 LAB — CBC WITH DIFFERENTIAL/PLATELET
BASOS PCT: 1 %
Basophils Absolute: 0.1 10*3/uL (ref 0.0–0.1)
Eosinophils Absolute: 0.3 10*3/uL (ref 0.0–0.7)
Eosinophils Relative: 4 %
HCT: 44.1 % (ref 39.0–52.0)
HEMOGLOBIN: 14.6 g/dL (ref 13.0–17.0)
LYMPHS ABS: 1.8 10*3/uL (ref 0.7–4.0)
Lymphocytes Relative: 21 %
MCH: 29.5 pg (ref 26.0–34.0)
MCHC: 33.1 g/dL (ref 30.0–36.0)
MCV: 89.1 fL (ref 78.0–100.0)
MONO ABS: 0.8 10*3/uL (ref 0.1–1.0)
MONOS PCT: 9 %
NEUTROS ABS: 5.4 10*3/uL (ref 1.7–7.7)
NEUTROS PCT: 65 %
PLATELETS: 280 10*3/uL (ref 150–400)
RBC: 4.95 MIL/uL (ref 4.22–5.81)
RDW: 12.8 % (ref 11.5–15.5)
WBC: 8.3 10*3/uL (ref 4.0–10.5)

## 2015-10-12 LAB — URINALYSIS, ROUTINE W REFLEX MICROSCOPIC
BILIRUBIN URINE: NEGATIVE
GLUCOSE, UA: NEGATIVE mg/dL
HGB URINE DIPSTICK: NEGATIVE
Ketones, ur: NEGATIVE mg/dL
Leukocytes, UA: NEGATIVE
Nitrite: NEGATIVE
PH: 5 (ref 5.0–8.0)
Protein, ur: NEGATIVE mg/dL
SPECIFIC GRAVITY, URINE: 1.024 (ref 1.005–1.030)

## 2015-10-12 LAB — BASIC METABOLIC PANEL
Anion gap: 9 (ref 5–15)
BUN: 22 mg/dL — ABNORMAL HIGH (ref 6–20)
CALCIUM: 9.4 mg/dL (ref 8.9–10.3)
CHLORIDE: 104 mmol/L (ref 101–111)
CO2: 25 mmol/L (ref 22–32)
CREATININE: 1.29 mg/dL — AB (ref 0.61–1.24)
GFR calc non Af Amer: 56 mL/min — ABNORMAL LOW (ref >60)
GLUCOSE: 126 mg/dL — AB (ref 65–99)
Potassium: 4.4 mmol/L (ref 3.5–5.1)
Sodium: 138 mmol/L (ref 135–145)

## 2015-10-12 LAB — PROTIME-INR
INR: 1.13 (ref 0.00–1.49)
PROTHROMBIN TIME: 14.7 s (ref 11.6–15.2)

## 2015-10-12 LAB — TYPE AND SCREEN
ABO/RH(D): O POS
ANTIBODY SCREEN: NEGATIVE

## 2015-10-12 LAB — ABO/RH: ABO/RH(D): O POS

## 2015-10-12 LAB — SURGICAL PCR SCREEN
MRSA, PCR: NEGATIVE
STAPHYLOCOCCUS AUREUS: NEGATIVE

## 2015-10-12 LAB — APTT: aPTT: 29 seconds (ref 24–37)

## 2015-10-12 NOTE — Progress Notes (Signed)
Anesthesia Chart Review:  Pt is 65 year old male scheduled for L total hip arthroplasty anterior approach on 10/25/2015 with Dr. Rhona Raider.   Cardiologist is Dr. Candee Furbish  PMH includes:  CAD (DES to [place] on 03/09/09, DES to PL branch on 03/22/09), intermediate coronary syndrome, HTN, hyperlipidemia, CKD (stage 3), OSA (not on CPAP), heart murmur, GERD. Never smoker. BMI 26.  Medications include: ASA, lipitor, plavix, imdur, lisinopril, protonix. Pt to stop plavix 7 days prior to surgery.   Preoperative labs reviewed.    Chest x-ray 04/10/15 reviewed. No active cardiopulmonary disease.   EKG 06/08/15: NSR. Cannot rule out anterior infarct, age undetermined.   Cardiac cath 11/23/13: - Left main: No obstructive disease.  - Left Anterior Descending Artery: Large caliber vessel that courses to the apex. The proximal vessel has diffuse 20% stenosis. The mid vessel has diffuse 30% stenosis. The distal vessel has no obstructive disease. There are two small caliber diagonal branches with 50% ostial stenosis, unchanged from previous cath. There is a large septal perforating branch with ostial 99% stenosis, unchanged from last cath.  - Circumflex Artery: Moderate caliber vessel with small caliber intermediate branch, small caliber first OM branch. The small caliber intermediate branch has proximal 60% stenosis. (This is a 1.75 mm vessel). The small caliber OM branch has a proximal stent with 100% occlusion at the proximal edge of the stent. This OM branch is seen to fill from right to left collaterals.  - Right Coronary Artery: Large dominant vessel with proximal luminal irregularities, mid 50-60% smooth stenosis and distal luminal irregularities. The posterolateral branch has a patent stent with no restenosis. The PDA is a small caliber vessel with no obstructive disease.   Impression:  1. Double vessel CAD  2. Patent stent in the posterolateral branch 3. Occluded stent in OM1 with collateral filling from  right to left collaterals 4. Moderate mid RCA stenosis 5. Normal LV systolic function - Recommendations: Continue medical management. Would add Imdur 30 mg po Qdaily.   Echo 09/20/11:  - Left ventricle: The cavity size was normal. Systolic function was normal. The estimated ejection fraction was in the range of 60% to 65%. Wall motion was normal; there were no regional wall motion abnormalities. Doppler parameters are consistent with abnormal left ventricular relaxation (grade 1 diastolic dysfunction).    If no changes, I anticipate pt can proceed with surgery as scheduled.   Willeen Cass, FNP-BC [facility]/Anesthesiology Phone: [phone] 10/12/2015 3:39 PM

## 2015-10-12 NOTE — Progress Notes (Signed)
Pt has CAD with stents. Pt denies any recent chest pain or sob. Last OV with Dr. Marlou Porch was 06/08/15. Pt states that Dr. Marlou Porch is not aware of his surgery.   Called [facility] for possible cardiac clearance. She states they have not requested one. She did state that pt does not need to stop Aspirin 81 mg prior to surgery. Pt and wife instructed this prior to leaving PAT appt. Did instruct them to stop Plavix 7 days prior to surgery.  Will forward chart to Anesthesiology PA/NP.  EKG - 06/08/15 - in EPIC Cath - 11/23/13 - in EPIC Echo - 09/20/11 - in EPIC

## 2015-10-12 NOTE — Pre-Procedure Instructions (Signed)
Tom Goodman  10/12/2015     Your procedure is scheduled on Tuesday, October 25, 2015 at 9:00 AM.   Report to [facility] Entrance "A" Admitting Office at 7:00 AM.   Call this number if you have problems the morning of surgery: [phone]   Any questions prior to day of surgery, please call [phone] between 8 & 4 PM.   Remember:  Do not eat food or drink liquids after midnight Monday, 10/24/15.  Take these medicines the morning of surgery with A SIP OF WATER: Citalopram (Celexa), Gabapentin (Neurontin), Isosorbide Mononitrate (Imdur), Hydrocodone - if needed  Stop Aspirin, Plavix, and Multivitamins 7 days prior to surgery.   Do not wear jewelry.  Do not wear lotions, powders, or cologne.  You may wear deodorant.  Men may shave face and neck.  Do not bring valuables to the hospital.  [facility] is not responsible for any belongings or valuables.  Contacts, dentures or bridgework may not be worn into surgery.  Leave your suitcase in the car.  After surgery it may be brought to your room.  For patients admitted to the hospital, discharge time will be determined by your treatment team.  Special instructions:  [place] - Preparing for Surgery  Before surgery, you can play an important role.  Because skin is not sterile, your skin needs to be as free of germs as possible.  You can reduce the number of germs on you skin by washing with CHG (chlorahexidine gluconate) soap before surgery.  CHG is an antiseptic cleaner which kills germs and bonds with the skin to continue killing germs even after washing.  Please DO NOT use if you have an allergy to CHG or antibacterial soaps.  If your skin becomes reddened/irritated stop using the CHG and inform your nurse when you arrive at Short Stay.  Do not shave (including legs and underarms) for at least 48 hours prior to the first CHG shower.  You may shave your face.  Please follow these instructions carefully:   1.  Shower  with CHG Soap the night before surgery and the                                morning of Surgery.  2.  If you choose to wash your hair, wash your hair first as usual with your       normal shampoo.  3.  After you shampoo, rinse your hair and body thoroughly to remove the                      Shampoo.  4.  Use CHG as you would any other liquid soap.  You can apply chg directly       to the skin and wash gently with scrungie or a clean washcloth.  5.  Apply the CHG Soap to your body ONLY FROM THE NECK DOWN.        Do not use on open wounds or open sores.  Avoid contact with your eyes, ears, mouth and genitals (private parts).  Wash genitals (private parts) with your normal soap.  6.  Wash thoroughly, paying special attention to the area where your surgery        will be performed.  7.  Thoroughly rinse your body with warm water from the neck down.  8.  DO NOT shower/wash with your normal soap after using  and rinsing off       the CHG Soap.  9.  Pat yourself dry with a clean towel.            10.  Wear clean pajamas.            11.  Place clean sheets on your bed the night of your first shower and do not        sleep with pets.  Day of Surgery  Do not apply any lotions the morning of surgery.  Please wear clean clothes to the hospital.   Please read over the following fact sheets that you were given. Pain Booklet, Coughing and Deep Breathing, Blood Transfusion Information, MRSA Information and Surgical Site Infection Prevention

## 2015-10-18 ENCOUNTER — Other Ambulatory Visit: Payer: Self-pay

## 2015-10-18 MED ORDER — CLOPIDOGREL BISULFATE 75 MG PO TABS: 75.0000 mg | ORAL_TABLET | Freq: Every day | ORAL | Status: DC

## 2015-10-24 MED ORDER — CEFAZOLIN SODIUM-DEXTROSE 2-3 GM-% IV SOLR
2.0000 g | INTRAVENOUS | Status: AC
  Administered 2015-10-25: 2 g via INTRAVENOUS
  Filled 2015-10-24: qty 50

## 2015-10-24 MED ORDER — CHLORHEXIDINE GLUCONATE 4 % EX LIQD: 60.0000 mL | Freq: Once | CUTANEOUS | Status: DC

## 2015-10-24 MED ORDER — LACTATED RINGERS IV SOLN
INTRAVENOUS | Status: DC
  Administered 2015-10-25: 08:00:00 via INTRAVENOUS

## 2015-10-24 NOTE — H&P (Signed)
TOTAL HIP ADMISSION H&P  Patient is admitted for left total hip arthroplasty.  Subjective:  Chief Complaint: left hip pain  HPI: Tom Goodman, 65 y.o. male, has a history of pain and functional disability in the left hip(s) due to arthritis and patient has failed non-surgical conservative treatments for greater than 12 weeks to include NSAID's and/or analgesics, corticosteriod injections, flexibility and strengthening excercises, weight reduction as appropriate and activity modification.  Onset of symptoms was gradual starting 5 years ago with gradually worsening course since that time.The patient noted no past surgery on the left hip(s).  Patient currently rates pain in the left hip at 10 out of 10 with activity. Patient has night pain, worsening of pain with activity and weight bearing, trendelenberg gait and pain that interfers with activities of daily living. Patient has evidence of subchondral cysts, subchondral sclerosis, periarticular osteophytes and joint space narrowing by imaging studies. This condition presents safety issues increasing the risk of falls. There is no current active infection.  Patient Active Problem List   Diagnosis Date Noted  . Chest pressure 04/10/2015  . Unstable angina ([place]) 11/22/2013  . Hypercholesteremia   . GERD (gastroesophageal reflux disease)   . Osteoarthritis   . Chest pain   . Intermediate coronary syndrome ([place]) 09/20/2011  . Coronary atherosclerosis of native coronary artery 09/20/2011  . Essential hypertension, benign 09/20/2011  . Mixed hyperlipidemia 09/20/2011  . Sleep apnea 09/20/2011  . Epigastric abdominal pain 09/20/2011   Past Medical History  Diagnosis Date  . Coronary atherosclerosis of native coronary artery     DES to OM1 on 03/09/09, DES to PL branch on 03/22/09 by Dr. Leonia Reeves, nuclear stress test 2013 - low risk,  . Essential hypertension, benign   . Intermediate coronary syndrome ([place])   . Mixed hyperlipidemia   . Epigastric  abdominal pain   . Hypercholesteremia   . GERD (gastroesophageal reflux disease)   . Hypercholesteremia   . Chest pain   . Sleep apnea     no cpap  . Osteoarthritis     left leg,back  . Heart murmur     found at 17 when in [employer], never had problems  . Chronic kidney disease     stage  3  . Kidney stones   . Diarrhea, functional   . Cataracts, bilateral     Past Surgical History  Procedure Laterality Date  . Cardiac catheterization  2010    Staged PCI of the second vessel because of renal problems  . Bilateral thumb surgery dr Burney Gauze    . Shoulder arthroscopy  2012    Left Feb, Right Sept  . Left heart catheterization with coronary angiogram N/A 11/23/2013    Procedure: LEFT HEART CATHETERIZATION WITH CORONARY ANGIOGRAM;  Surgeon: Jettie Booze, MD;  Location[facility] CATH LAB;  Service: Cardiovascular;  Laterality: N/A;  . Vasectomy    . Colonoscopy      No prescriptions prior to admission   Allergies  Allergen Reactions  . Corticosteroids Swelling    Swelling of lips  . Prednisone Swelling    Swollen lips    Social History  Substance Use Topics  . Smoking status: Never Smoker   . Smokeless tobacco: Never Used  . Alcohol Use: No    Family History  Problem Relation Age of Onset  . Emphysema Father   . Dementia Mother   . Hypertension Brother   . CVA Sister   . Heart attack Neg Hx   . Stroke Mother   .  Stroke Sister      Review of Systems  Musculoskeletal: Positive for joint pain.       Left hip  All other systems reviewed and are negative.   Objective:  Physical Exam  Constitutional: He is oriented to person, place, and time. He appears well-developed and well-nourished.  HENT:  Head: Normocephalic and atraumatic.  Eyes: Pupils are equal, round, and reactive to light.  Neck: Normal range of motion.  Cardiovascular: Normal rate.   Respiratory: Effort normal.  GI: Soft.  Musculoskeletal:  Left hip motion is somewhat limited and very painful at  extremes of rotation.  His leg lengths seem equal.  He has some pain over the greater trochanter.  Lumbar motion is a little limited.  Straight leg raise causes back pain.  Sensation and motor function are intact in his feet and he has palpable pulses at both ankles.   Neurological: He is alert and oriented to person, place, and time.  Skin: Skin is warm and dry.  Psychiatric: He has a normal mood and affect. His behavior is normal. Judgment and thought content normal.    Vital signs in last 24 hours:    Labs:   Estimated body mass index is 25.97 kg/(m^2) as calculated from the following:   Height as of 06/08/15: 5\' 10"  (1.778 m).   Weight as of 06/08/15: 82.101 kg (181 lb).   Imaging Review Plain radiographs demonstrate severe degenerative joint disease of the left hip(s). The bone quality appears to be good for age and reported activity level.  Assessment/Plan:  End stage primary arthritis, left hip(s)  The patient history, physical examination, clinical judgement of the provider and imaging studies are consistent with end stage degenerative joint disease of the left hip(s) and total hip arthroplasty is deemed medically necessary. The treatment options including medical management, injection therapy, arthroscopy and arthroplasty were discussed at length. The risks and benefits of total hip arthroplasty were presented and reviewed. The risks due to aseptic loosening, infection, stiffness, dislocation/subluxation,  thromboembolic complications and other imponderables were discussed.  The patient acknowledged the explanation, agreed to proceed with the plan and consent was signed. Patient is being admitted for inpatient treatment for surgery, pain control, PT, OT, prophylactic antibiotics, VTE prophylaxis, progressive ambulation and ADL's and discharge planning.The patient is planning to be discharged home with home health services

## 2015-10-25 ENCOUNTER — Encounter (HOSPITAL_COMMUNITY)
Admission: RE | Disposition: A | Discharge status: Home / Self Care | Payer: Self-pay | Source: Ambulatory Visit | Attending: Orthopaedic Surgery

## 2015-10-25 ENCOUNTER — Encounter (HOSPITAL_COMMUNITY): Payer: Self-pay | Admitting: *Deleted

## 2015-10-25 ENCOUNTER — Inpatient Hospital Stay (HOSPITAL_COMMUNITY): Payer: BLUE CROSS/BLUE SHIELD | Admitting: Anesthesiology

## 2015-10-25 ENCOUNTER — Inpatient Hospital Stay (HOSPITAL_COMMUNITY): Payer: BLUE CROSS/BLUE SHIELD | Admitting: Emergency Medicine

## 2015-10-25 ENCOUNTER — Inpatient Hospital Stay (HOSPITAL_COMMUNITY)
Admission: RE | Admit: 2015-10-25 | Discharge: 2015-10-26 | DRG: 470 | Disposition: A | Discharge status: Home / Self Care | Payer: BLUE CROSS/BLUE SHIELD | Source: Ambulatory Visit | Attending: Orthopaedic Surgery | Admitting: Orthopaedic Surgery

## 2015-10-25 ENCOUNTER — Inpatient Hospital Stay (HOSPITAL_COMMUNITY): Payer: BLUE CROSS/BLUE SHIELD

## 2015-10-25 DIAGNOSIS — Z79899 Other long term (current) drug therapy: Secondary | ICD-10-CM

## 2015-10-25 DIAGNOSIS — N183 Chronic kidney disease, stage 3 (moderate): Secondary | ICD-10-CM | POA: Diagnosis not present

## 2015-10-25 DIAGNOSIS — M1612 Unilateral primary osteoarthritis, left hip: Secondary | ICD-10-CM | POA: Diagnosis present

## 2015-10-25 DIAGNOSIS — E7800 Pure hypercholesterolemia, unspecified: Secondary | ICD-10-CM | POA: Diagnosis present

## 2015-10-25 DIAGNOSIS — K219 Gastro-esophageal reflux disease without esophagitis: Secondary | ICD-10-CM | POA: Diagnosis present

## 2015-10-25 DIAGNOSIS — M25552 Pain in left hip: Secondary | ICD-10-CM | POA: Diagnosis present

## 2015-10-25 DIAGNOSIS — G4730 Sleep apnea, unspecified: Secondary | ICD-10-CM | POA: Diagnosis present

## 2015-10-25 DIAGNOSIS — Z955 Presence of coronary angioplasty implant and graft: Secondary | ICD-10-CM

## 2015-10-25 DIAGNOSIS — H269 Unspecified cataract: Secondary | ICD-10-CM | POA: Diagnosis present

## 2015-10-25 DIAGNOSIS — Z7982 Long term (current) use of aspirin: Secondary | ICD-10-CM | POA: Diagnosis not present

## 2015-10-25 DIAGNOSIS — I129 Hypertensive chronic kidney disease with stage 1 through stage 4 chronic kidney disease, or unspecified chronic kidney disease: Secondary | ICD-10-CM | POA: Diagnosis not present

## 2015-10-25 DIAGNOSIS — I2510 Atherosclerotic heart disease of native coronary artery without angina pectoris: Secondary | ICD-10-CM | POA: Diagnosis present

## 2015-10-25 DIAGNOSIS — Z7902 Long term (current) use of antithrombotics/antiplatelets: Secondary | ICD-10-CM

## 2015-10-25 DIAGNOSIS — Z419 Encounter for procedure for purposes other than remedying health state, unspecified: Secondary | ICD-10-CM

## 2015-10-25 DIAGNOSIS — Z96642 Presence of left artificial hip joint: Secondary | ICD-10-CM | POA: Diagnosis not present

## 2015-10-25 DIAGNOSIS — Z471 Aftercare following joint replacement surgery: Secondary | ICD-10-CM | POA: Diagnosis not present

## 2015-10-25 HISTORY — PX: TOTAL HIP ARTHROPLASTY: SHX124

## 2015-10-25 SURGERY — ARTHROPLASTY, HIP, TOTAL, ANTERIOR APPROACH
Anesthesia: Spinal | Site: Hip | Laterality: Left

## 2015-10-25 MED ORDER — OXYCODONE HCL 5 MG PO TABS
5.0000 mg | ORAL_TABLET | ORAL | Status: DC | PRN
  Administered 2015-10-25 – 2015-10-26 (×6): 10 mg via ORAL
  Filled 2015-10-25 (×6): qty 2

## 2015-10-25 MED ORDER — METOCLOPRAMIDE HCL 5 MG PO TABS: 5.0000 mg | ORAL_TABLET | Freq: Three times a day (TID) | ORAL | Status: DC | PRN

## 2015-10-25 MED ORDER — CITALOPRAM HYDROBROMIDE 20 MG PO TABS
20.0000 mg | ORAL_TABLET | Freq: Every day | ORAL | Status: DC
  Administered 2015-10-26: 20 mg via ORAL
  Filled 2015-10-25: qty 1

## 2015-10-25 MED ORDER — 0.9 % SODIUM CHLORIDE (POUR BTL) OPTIME
TOPICAL | Status: DC | PRN
  Administered 2015-10-25: 1000 mL

## 2015-10-25 MED ORDER — CLOPIDOGREL BISULFATE 75 MG PO TABS
75.0000 mg | ORAL_TABLET | Freq: Every day | ORAL | Status: DC
  Administered 2015-10-26: 75 mg via ORAL
  Filled 2015-10-25: qty 1

## 2015-10-25 MED ORDER — LIDOCAINE HCL (CARDIAC) 20 MG/ML IV SOLN
INTRAVENOUS | Status: AC
  Filled 2015-10-25: qty 5

## 2015-10-25 MED ORDER — HYDROMORPHONE HCL 1 MG/ML IJ SOLN
0.5000 mg | INTRAMUSCULAR | Status: DC | PRN
  Administered 2015-10-25 (×2): 1 mg via INTRAVENOUS
  Filled 2015-10-25 (×2): qty 1

## 2015-10-25 MED ORDER — ASPIRIN EC 325 MG PO TBEC
325.0000 mg | DELAYED_RELEASE_TABLET | Freq: Two times a day (BID) | ORAL | Status: DC
  Administered 2015-10-26: 325 mg via ORAL
  Filled 2015-10-25: qty 1

## 2015-10-25 MED ORDER — BUPIVACAINE-EPINEPHRINE 0.25% -1:200000 IJ SOLN
INTRAMUSCULAR | Status: DC | PRN
  Administered 2015-10-25: 30 mL

## 2015-10-25 MED ORDER — ACETAMINOPHEN 650 MG RE SUPP: 650.0000 mg | Freq: Four times a day (QID) | RECTAL | Status: DC | PRN

## 2015-10-25 MED ORDER — METHOCARBAMOL 1000 MG/10ML IJ SOLN
500.0000 mg | Freq: Four times a day (QID) | INTRAMUSCULAR | Status: DC | PRN
  Filled 2015-10-25: qty 5

## 2015-10-25 MED ORDER — PHENOL 1.4 % MT LIQD
1.0000 | OROMUCOSAL | Status: DC | PRN
Start: 2015-10-25 — End: 2015-10-26

## 2015-10-25 MED ORDER — MIDAZOLAM HCL 5 MG/5ML IJ SOLN
INTRAMUSCULAR | Status: DC | PRN
  Administered 2015-10-25: 2 mg via INTRAVENOUS

## 2015-10-25 MED ORDER — SODIUM CHLORIDE 0.9 % IV SOLN
1000.0000 mg | INTRAVENOUS | Status: AC
  Administered 2015-10-25: 1000 mg via INTRAVENOUS
  Filled 2015-10-25: qty 10

## 2015-10-25 MED ORDER — GABAPENTIN 300 MG PO CAPS
300.0000 mg | ORAL_CAPSULE | Freq: Two times a day (BID) | ORAL | Status: DC
  Administered 2015-10-25 – 2015-10-26 (×2): 300 mg via ORAL
  Filled 2015-10-25 (×2): qty 1

## 2015-10-25 MED ORDER — PROPOFOL 10 MG/ML IV BOLUS
INTRAVENOUS | Status: AC
  Filled 2015-10-25: qty 20

## 2015-10-25 MED ORDER — CEFAZOLIN SODIUM-DEXTROSE 2-3 GM-% IV SOLR
2.0000 g | Freq: Four times a day (QID) | INTRAVENOUS | Status: AC
  Administered 2015-10-25 (×2): 2 g via INTRAVENOUS
  Filled 2015-10-25 (×2): qty 50

## 2015-10-25 MED ORDER — LACTATED RINGERS IV SOLN
INTRAVENOUS | Status: DC | PRN
  Administered 2015-10-25 (×2): via INTRAVENOUS

## 2015-10-25 MED ORDER — ISOSORBIDE MONONITRATE ER 30 MG PO TB24
30.0000 mg | ORAL_TABLET | Freq: Every day | ORAL | Status: DC
  Administered 2015-10-26: 30 mg via ORAL
  Filled 2015-10-25: qty 1

## 2015-10-25 MED ORDER — ATORVASTATIN CALCIUM 40 MG PO TABS
40.0000 mg | ORAL_TABLET | Freq: Every day | ORAL | Status: DC
  Administered 2015-10-25: 40 mg via ORAL
  Filled 2015-10-25: qty 1

## 2015-10-25 MED ORDER — BUPIVACAINE LIPOSOME 1.3 % IJ SUSP
20.0000 mL | INTRAMUSCULAR | Status: AC
  Administered 2015-10-25: 20 mL
  Filled 2015-10-25: qty 20

## 2015-10-25 MED ORDER — MIDAZOLAM HCL 2 MG/2ML IJ SOLN
INTRAMUSCULAR | Status: AC
  Filled 2015-10-25: qty 2

## 2015-10-25 MED ORDER — DIPHENHYDRAMINE HCL 12.5 MG/5ML PO ELIX: 12.5000 mg | ORAL_SOLUTION | ORAL | Status: DC | PRN

## 2015-10-25 MED ORDER — MENTHOL 3 MG MT LOZG: 1.0000 | LOZENGE | OROMUCOSAL | Status: DC | PRN

## 2015-10-25 MED ORDER — PANTOPRAZOLE SODIUM 40 MG PO TBEC
40.0000 mg | DELAYED_RELEASE_TABLET | Freq: Every day | ORAL | Status: DC
  Administered 2015-10-25: 40 mg via ORAL
  Filled 2015-10-25: qty 1

## 2015-10-25 MED ORDER — DEXTROSE 5 % IV SOLN
10.0000 mg | INTRAVENOUS | Status: DC | PRN
  Administered 2015-10-25: 20 ug/min via INTRAVENOUS

## 2015-10-25 MED ORDER — METOCLOPRAMIDE HCL 5 MG/ML IJ SOLN: 5.0000 mg | Freq: Three times a day (TID) | INTRAMUSCULAR | Status: DC | PRN

## 2015-10-25 MED ORDER — ACETAMINOPHEN 325 MG PO TABS
650.0000 mg | ORAL_TABLET | Freq: Four times a day (QID) | ORAL | Status: DC | PRN
  Administered 2015-10-25: 650 mg via ORAL
  Filled 2015-10-25: qty 2

## 2015-10-25 MED ORDER — PROMETHAZINE HCL 25 MG/ML IJ SOLN: 6.2500 mg | INTRAMUSCULAR | Status: DC | PRN

## 2015-10-25 MED ORDER — FENTANYL CITRATE (PF) 250 MCG/5ML IJ SOLN
INTRAMUSCULAR | Status: AC
  Filled 2015-10-25: qty 5

## 2015-10-25 MED ORDER — BISACODYL 5 MG PO TBEC: 5.0000 mg | DELAYED_RELEASE_TABLET | Freq: Every day | ORAL | Status: DC | PRN

## 2015-10-25 MED ORDER — DOCUSATE SODIUM 100 MG PO CAPS
100.0000 mg | ORAL_CAPSULE | Freq: Two times a day (BID) | ORAL | Status: DC
  Administered 2015-10-25 – 2015-10-26 (×2): 100 mg via ORAL
  Filled 2015-10-25 (×3): qty 1

## 2015-10-25 MED ORDER — LACTATED RINGERS IV SOLN
INTRAVENOUS | Status: DC
  Administered 2015-10-26: via INTRAVENOUS

## 2015-10-25 MED ORDER — PROPOFOL 500 MG/50ML IV EMUL
INTRAVENOUS | Status: DC | PRN
  Administered 2015-10-25: 50 ug/kg/min via INTRAVENOUS

## 2015-10-25 MED ORDER — BUPIVACAINE-EPINEPHRINE (PF) 0.25% -1:200000 IJ SOLN
INTRAMUSCULAR | Status: AC
  Filled 2015-10-25: qty 30

## 2015-10-25 MED ORDER — LOPERAMIDE HCL 2 MG PO CAPS: 2.0000 mg | ORAL_CAPSULE | ORAL | Status: DC | PRN

## 2015-10-25 MED ORDER — LISINOPRIL 10 MG PO TABS
10.0000 mg | ORAL_TABLET | Freq: Every day | ORAL | Status: DC
Start: 2015-10-25 — End: 2015-10-26
  Administered 2015-10-26: 10 mg via ORAL
  Filled 2015-10-25: qty 1

## 2015-10-25 MED ORDER — NITROGLYCERIN 0.4 MG SL SUBL: 0.4000 mg | SUBLINGUAL_TABLET | SUBLINGUAL | Status: DC | PRN

## 2015-10-25 MED ORDER — EPHEDRINE SULFATE 50 MG/ML IJ SOLN
INTRAMUSCULAR | Status: DC | PRN
  Administered 2015-10-25: 5 mg via INTRAVENOUS

## 2015-10-25 MED ORDER — ONDANSETRON HCL 4 MG PO TABS: 4.0000 mg | ORAL_TABLET | Freq: Four times a day (QID) | ORAL | Status: DC | PRN

## 2015-10-25 MED ORDER — METHOCARBAMOL 500 MG PO TABS
500.0000 mg | ORAL_TABLET | Freq: Four times a day (QID) | ORAL | Status: DC | PRN
  Administered 2015-10-25 – 2015-10-26 (×2): 500 mg via ORAL
  Filled 2015-10-25 (×2): qty 1

## 2015-10-25 MED ORDER — HYDROMORPHONE HCL 1 MG/ML IJ SOLN: 0.2500 mg | INTRAMUSCULAR | Status: DC | PRN

## 2015-10-25 MED ORDER — ROCURONIUM BROMIDE 50 MG/5ML IV SOLN
INTRAVENOUS | Status: AC
  Filled 2015-10-25: qty 1

## 2015-10-25 MED ORDER — SUCCINYLCHOLINE CHLORIDE 20 MG/ML IJ SOLN
INTRAMUSCULAR | Status: AC
  Filled 2015-10-25: qty 1

## 2015-10-25 MED ORDER — ONDANSETRON HCL 4 MG/2ML IJ SOLN: 4.0000 mg | Freq: Four times a day (QID) | INTRAMUSCULAR | Status: DC | PRN

## 2015-10-25 MED ORDER — ONDANSETRON HCL 4 MG/2ML IJ SOLN
INTRAMUSCULAR | Status: DC | PRN
  Administered 2015-10-25: 4 mg via INTRAVENOUS

## 2015-10-25 MED ORDER — ALUM & MAG HYDROXIDE-SIMETH 200-200-20 MG/5ML PO SUSP: 30.0000 mL | ORAL | Status: DC | PRN

## 2015-10-25 SURGICAL SUPPLY — 54 items
BLADE SAW SGTL 18X1.27X75 (BLADE) ×2 IMPLANT
BLADE SURG ROTATE 9660 (MISCELLANEOUS) IMPLANT
CAPT HIP TOTAL 2 ×2 IMPLANT
CELLS DAT CNTRL 66122 CELL SVR (MISCELLANEOUS) ×1 IMPLANT
CLSR STERI-STRIP ANTIMIC 1/2X4 (GAUZE/BANDAGES/DRESSINGS) ×2 IMPLANT
COVER PERINEAL POST (MISCELLANEOUS) ×2 IMPLANT
COVER SURGICAL LIGHT HANDLE (MISCELLANEOUS) ×2 IMPLANT
DRAPE C-ARM 42X72 X-RAY (DRAPES) ×2 IMPLANT
DRAPE IMP U-DRAPE 54X76 (DRAPES) ×2 IMPLANT
DRAPE STERI IOBAN 125X83 (DRAPES) ×2 IMPLANT
DRAPE U-SHAPE 47X51 STRL (DRAPES) ×6 IMPLANT
DRSG AQUACEL AG ADV 3.5X10 (GAUZE/BANDAGES/DRESSINGS) ×2 IMPLANT
DURAPREP 26ML APPLICATOR (WOUND CARE) ×2 IMPLANT
ELECT BLADE 4.0 EZ CLEAN MEGAD (MISCELLANEOUS)
ELECT CAUTERY BLADE 6.4 (BLADE) ×2 IMPLANT
ELECT REM PT RETURN 9FT ADLT (ELECTROSURGICAL) ×2
ELECTRODE BLDE 4.0 EZ CLN MEGD (MISCELLANEOUS) IMPLANT
ELECTRODE REM PT RTRN 9FT ADLT (ELECTROSURGICAL) ×1 IMPLANT
FACESHIELD WRAPAROUND (MASK) ×4 IMPLANT
GLOVE BIO SURGEON STRL SZ8 (GLOVE) ×4 IMPLANT
GLOVE BIOGEL PI IND STRL 8 (GLOVE) ×2 IMPLANT
GLOVE BIOGEL PI INDICATOR 8 (GLOVE) ×2
GOWN STRL REUS W/ TWL LRG LVL3 (GOWN DISPOSABLE) ×1 IMPLANT
GOWN STRL REUS W/ TWL XL LVL3 (GOWN DISPOSABLE) ×2 IMPLANT
GOWN STRL REUS W/TWL LRG LVL3 (GOWN DISPOSABLE) ×1
GOWN STRL REUS W/TWL XL LVL3 (GOWN DISPOSABLE) ×2
KIT BASIN OR (CUSTOM PROCEDURE TRAY) ×2 IMPLANT
KIT ROOM TURNOVER OR (KITS) ×2 IMPLANT
LINER BOOT UNIVERSAL DISP (MISCELLANEOUS) ×2 IMPLANT
MANIFOLD NEPTUNE II (INSTRUMENTS) ×2 IMPLANT
MARKER SKIN DUAL TIP RULER LAB (MISCELLANEOUS) ×2 IMPLANT
NEEDLE 22X1 1/2 (OR ONLY) (NEEDLE) ×2 IMPLANT
NS IRRIG 1000ML POUR BTL (IV SOLUTION) ×2 IMPLANT
PACK TOTAL JOINT (CUSTOM PROCEDURE TRAY) ×2 IMPLANT
PACK UNIVERSAL I (CUSTOM PROCEDURE TRAY) ×2 IMPLANT
PAD ARMBOARD 7.5X6 YLW CONV (MISCELLANEOUS) ×4 IMPLANT
RTRCTR WOUND ALEXIS 18CM MED (MISCELLANEOUS) ×2
SLEEVE SURGEON STRL (DRAPES) ×2 IMPLANT
STAPLER VISISTAT 35W (STAPLE) ×2 IMPLANT
SUT ETHIBOND NAB CT1 #1 30IN (SUTURE) ×6 IMPLANT
SUT MNCRL AB 3-0 PS2 18 (SUTURE) ×2 IMPLANT
SUT VIC AB 0 CT1 27 (SUTURE)
SUT VIC AB 0 CT1 27XBRD ANBCTR (SUTURE) IMPLANT
SUT VIC AB 1 CT1 27 (SUTURE) ×1
SUT VIC AB 1 CT1 27XBRD ANBCTR (SUTURE) ×1 IMPLANT
SUT VIC AB 2-0 CT1 27 (SUTURE) ×1
SUT VIC AB 2-0 CT1 TAPERPNT 27 (SUTURE) ×1 IMPLANT
SUT VLOC 180 0 24IN GS25 (SUTURE) ×2 IMPLANT
SYRINGE 60CC LL (MISCELLANEOUS) ×2 IMPLANT
TOWEL OR 17X24 6PK STRL BLUE (TOWEL DISPOSABLE) ×2 IMPLANT
TOWEL OR 17X26 10 PK STRL BLUE (TOWEL DISPOSABLE) ×4 IMPLANT
TRAY FOLEY CATH 14FR (SET/KITS/TRAYS/PACK) IMPLANT
TUBE SUCT ARGYLE STRL (TUBING) ×2 IMPLANT
WATER STERILE IRR 1000ML POUR (IV SOLUTION) ×4 IMPLANT

## 2015-10-25 NOTE — Anesthesia Procedure Notes (Addendum)
Spinal Patient location during procedure: OR Start time: 10/25/2015 9:25 AM Staffing Anesthesiologist: MASSAG[place], TERRY Performed by: anesthesiologist  Preanesthetic Checklist Completed: patient identified, site marked, surgical consent, pre-op evaluation, timeout performed, IV checked, risks and benefits discussed and monitors and equipment checked Spinal Block Patient position: sitting Prep: DuraPrep Patient monitoring: heart rate, cardiac monitor, continuous pulse ox and blood pressure Approach: midline Location: L3-4 Injection technique: single-shot Needle Needle type: Pencan  Needle gauge: 24 G Needle length: 9 cm Needle insertion depth: 6 cm Assessment Sensory level: T6 Additional Notes Tolerated well  Procedure Name: MAC Date/Time: 10/25/2015 9:40 AM Performed by: Izora Gala Placement Confirmation: positive ETCO2

## 2015-10-25 NOTE — Evaluation (Signed)
Physical Therapy Evaluation Patient Details Name: Tom Goodman MRN: [id number] DOB: 13-Dec-1950 Today's Date: 10/25/2015   History of Present Illness  Pt admitted for L THA. PMHx: GERD, OA  Clinical Impression  Pt very pleasant and moving well. Pt able to transition to EOB and standing without physical assist. Pt performed initial transfers and gait well. After increased gait pt stated dizziness, sat in chair and had a syncopal episode of grossly 10 sec. Pt returned to room in recliner with BP 122/59, HR 68. Pt with decreased strength, ROM, gait and activity tolerance who will benefit from acute therapy to maximize mobility, strength and function to decrease burden of care. Family present throughout session and RN aware of all events of eval.     Follow Up Recommendations Home health PT    Equipment Recommendations  3in1 (PT);Rolling walker with 5" wheels    Recommendations for Other Services       Precautions / Restrictions Precautions Precautions: Fall Precaution Comments: syncope on eval      Mobility  Bed Mobility Overal bed mobility: Modified Independent                Transfers Overall transfer level: Needs assistance   Transfers: Sit to/from Stand Sit to Stand: Supervision         General transfer comment: cues for hand placement and safety x 3 trials.   Ambulation/Gait Ambulation/Gait assistance: Min guard Ambulation Distance (Feet): 80 Feet Assistive device: Rolling walker (2 wheeled) Gait Pattern/deviations: Step-through pattern;Decreased stride length   Gait velocity interpretation: Below normal speed for age/gender General Gait Details: cues for posture and RW use  Stairs Stairs: Yes Stairs assistance: Min assist Stair Management: Backwards;With walker;Step to pattern Number of Stairs: 3 General stair comments: cues for sequence and safety with dgtr present and handout provided  Wheelchair Mobility    Modified Rankin (Stroke Patients  Only)       Balance                                             Pertinent Vitals/Pain Pain Assessment: 0-10 Pain Score: 4  Pain Location: left hip Pain Descriptors / Indicators: Aching Pain Intervention(s): Limited activity within patient's tolerance;Monitored during session;Premedicated before session;Repositioned;Ice applied    Home Living Family/patient expects to be discharged to:: Private residence Living Arrangements: Spouse/significant other Available Help at Discharge: Family;Available 24 hours/day Type of Home: House Home Access: Stairs to enter Entrance Stairs-Rails: None Entrance Stairs-Number of Steps: 4 Home Layout: One level Home Equipment: None      Prior Function Level of Independence: Independent               Hand Dominance        Extremity/Trunk Assessment   Upper Extremity Assessment: Overall WFL for tasks assessed           Lower Extremity Assessment: LLE deficits/detail   LLE Deficits / Details: decreased ROM and strength as expected post op  Cervical / Trunk Assessment: Normal  Communication   Communication: No difficulties  Cognition Arousal/Alertness: Awake/alert Behavior During Therapy: WFL for tasks assessed/performed Overall Cognitive Status: Within Functional Limits for tasks assessed                      General Comments      Exercises        Assessment/Plan  PT Assessment Patient needs continued PT services  PT Diagnosis Difficulty walking;Acute pain   PT Problem List Decreased activity tolerance;Decreased range of motion;Decreased strength;Pain;Decreased mobility  PT Treatment Interventions DME instruction;Gait training;Stair training;Functional mobility training;Therapeutic activities;Therapeutic exercise;Patient/family education   PT Goals (Current goals can be found in the Care Plan section) Acute Rehab PT Goals Patient Stated Goal: return to my dog, work as a Geophysicist/field seismologist PT Goal Formulation: With patient/family Time For Goal Achievement: 11/01/15 Potential to Achieve Goals: Good    Frequency 7X/week   Barriers to discharge        Co-evaluation               End of Session   Activity Tolerance: Patient tolerated treatment well Patient left: in chair;with call bell/phone within reach;with family/visitor present Nurse Communication: Mobility status;Precautions;Weight bearing status         Time: 1349-1416 PT Time Calculation (min) (ACUTE ONLY): 27 min   Charges:   PT Evaluation $PT Eval Low Complexity: 1 Procedure PT Treatments $Gait Training: 8-22 mins   PT G CodesMelford Aase 10/25/2015, 2:23 PM Elwyn Reach, [place]

## 2015-10-25 NOTE — Transfer of Care (Signed)
Immediate Anesthesia Transfer of Care Note  Patient: Tom Goodman  Procedure(s) Performed: Procedure(s): TOTAL HIP ARTHROPLASTY ANTERIOR APPROACH (Left)  Patient Location: PACU  Anesthesia Type:Spinal  Level of Consciousness: awake and alert   Airway & Oxygen Therapy: Patient Spontanous Breathing and Patient connected to nasal cannula oxygen  Post-op Assessment: Report given to RN and Post -op Vital signs reviewed and stable  Post vital signs: Reviewed and stable  Last Vitals:  Filed Vitals:   10/25/15 0753  BP: 114/59  Pulse: 60  Temp: 36.6 C  Resp: 20    Complications: No apparent anesthesia complications

## 2015-10-25 NOTE — Anesthesia Postprocedure Evaluation (Signed)
Anesthesia Post Note  Patient: Tom Goodman  Procedure(s) Performed: Procedure(s) (LRB): TOTAL HIP ARTHROPLASTY ANTERIOR APPROACH (Left)  Patient location during evaluation: PACU Anesthesia Type: Spinal Level of consciousness: oriented and awake and alert Pain management: pain level controlled Vital Signs Assessment: post-procedure vital signs reviewed and stable Respiratory status: spontaneous breathing, respiratory function stable and patient connected to nasal cannula oxygen Cardiovascular status: blood pressure returned to baseline and stable Postop Assessment: no headache and no backache Anesthetic complications: no (I & O cath for urine)    Last Vitals:  Filed Vitals:   10/25/15 1235 10/25/15 1245  BP: 129/63 123/68  Pulse: 67   Temp:  36.4 C  Resp: 25     Last Pain: There were no vitals filed for this visit.               Desa Rech,JAMES TERRILL

## 2015-10-25 NOTE — Care Management (Signed)
Utilization review completed. Bonifacio Pruden, RN Case Manager [phone]. 

## 2015-10-25 NOTE — Anesthesia Preprocedure Evaluation (Signed)
Anesthesia Evaluation  Patient identified by MRN, date of birth, ID band Patient awake    Reviewed: Allergy & Precautions, NPO status , Patient's Chart, lab work & pertinent test results  Airway Mallampati: II  TM Distance: >3 FB Neck ROM: Full    Dental   Pulmonary sleep apnea ,    breath sounds clear to auscultation       Cardiovascular hypertension, + CAD and + Cardiac Stents   Rhythm:Regular Rate:Normal     Neuro/Psych    GI/Hepatic GERD  ,  Endo/Other    Renal/GU Renal disease     Musculoskeletal  (+) Arthritis , Back detention   Abdominal   Peds  Hematology   Anesthesia Other Findings   Reproductive/Obstetrics                             Anesthesia Physical Anesthesia Plan  ASA: III  Anesthesia Plan: Spinal   Post-op Pain Management:    Induction: Intravenous  Airway Management Planned: Natural Airway  Additional Equipment:   Intra-op Plan:   Post-operative Plan:   Informed Consent: I have reviewed the patients History and Physical, chart, labs and discussed the procedure including the risks, benefits and alternatives for the proposed anesthesia with the patient or authorized representative who has indicated his/her understanding and acceptance.   Dental advisory given  Plan Discussed with: CRNA and Surgeon  Anesthesia Plan Comments:         Anesthesia Quick Evaluation

## 2015-10-25 NOTE — Progress Notes (Signed)
Pt a/o, c/o knee pain, PRN pain meds given as ordered, pts temp was 100.2 at 2201, PRN Tylenol given as ordered, pt temp at 2352 98.5, VSS, pt stable, and resting comfortably

## 2015-10-25 NOTE — Interval H&P Note (Signed)
OK for surgery PD 

## 2015-10-25 NOTE — Op Note (Signed)

## 2015-10-26 ENCOUNTER — Encounter (HOSPITAL_COMMUNITY): Payer: Self-pay | Admitting: Orthopaedic Surgery

## 2015-10-26 MED ORDER — OXYCODONE-ACETAMINOPHEN 5-325 MG PO TABS: 1.0000 | ORAL_TABLET | ORAL | Status: DC | PRN

## 2015-10-26 MED ORDER — ASPIRIN 325 MG PO TBEC: 325.0000 mg | DELAYED_RELEASE_TABLET | Freq: Two times a day (BID) | ORAL | Status: DC

## 2015-10-26 MED ORDER — METHOCARBAMOL 500 MG PO TABS: 500.0000 mg | ORAL_TABLET | Freq: Four times a day (QID) | ORAL | Status: DC | PRN

## 2015-10-26 NOTE — Progress Notes (Signed)
Subjective: 1 Day Post-Op Procedure(s) (LRB): TOTAL HIP ARTHROPLASTY ANTERIOR APPROACH (Left)   Patient doing great this morning. Minimal pain. He has already done stairs and steps with PT and his hoping to go home today.  Activity level:  WBAT Diet tolerance:  ok Voiding:  ok Patient reports pain as mild.    Objective: Vital signs in last 24 hours: Temp:  [97.5 F (36.4 C)-100.2 F (37.9 C)] 98 F (36.7 C) (01/25 0539) Pulse Rate:  [54-83] 79 (01/25 0539) Resp:  [7-25] 16 (01/25 0539) BP: (114-137)/(56-74) 128/63 mmHg (01/25 0539) SpO2:  [95 %-100 %] 98 % (01/25 0539) Weight:  [83.008 kg (183 lb)] 83.008 kg (183 lb) (01/24 0753)  Labs: No results for input(s): HGB in the last 72 hours. No results for input(s): WBC, RBC, HCT, PLT in the last 72 hours. No results for input(s): NA, K, CL, CO2, BUN, CREATININE, GLUCOSE, CALCIUM in the last 72 hours. No results for input(s): LABPT, INR in the last 72 hours.  Physical Exam:  Neurologically intact ABD soft Neurovascular intact Sensation intact distally Intact pulses distally Dorsiflexion/Plantar flexion intact Incision: dressing C/D/I and no drainage No cellulitis present Compartment soft  Assessment/Plan:  1 Day Post-Op Procedure(s) (LRB): TOTAL HIP ARTHROPLASTY ANTERIOR APPROACH (Left) Advance diet Up with therapy D/C IV fluids Discharge home with home health today after 2 rounds of PT if continuing to do well. ASA 325mg  BID x 4 weeks post op for DVT Prevention. Follow up in office 2 weeks post op.  [place]a Ponder, Larwance Sachs 10/26/2015, 7:48 AM

## 2015-10-26 NOTE — Progress Notes (Signed)
Physical Therapy Treatment Patient Details Name: Tom Goodman MRN: [id number] DOB: [birth year] Today's Date: 10/26/2015    History of Present Illness Pt admitted for L THA. PMHx: GERD, OA    PT Comments    Pt is progressing well with his mobility.  He walked with OT the entire length of the unit this AM and is now in pain, requesting meds from RN.  PT reviewed entire exercise packet and will defer practicing stairs and gait again until PM session.  Plan is to d/c home after PM session.   Follow Up Recommendations  Home health PT     Equipment Recommendations  3in1 (PT);Rolling walker with 5" wheels    Recommendations for Other Services   NA     Precautions / Restrictions Precautions Precautions: Fall Restrictions RLE Weight Bearing: Weight bearing as tolerated    Mobility  Bed Mobility Overal bed mobility: Modified Independent;Needs Assistance Bed Mobility: Supine to Sit;Sit to Supine     Supine to sit: Modified independent (Device/Increase time) Sit to supine: Min guard   General bed mobility comments: Mod I sliding leg over EOB with HOB elevated and use of railings.  Min guard assist to help lift left leg back into bed after standing.   Transfers Overall transfer level: Needs assistance Equipment used: Rolling walker (2 wheeled) Transfers: Sit to/from Stand Sit to Stand: Supervision         General transfer comment: supervision for safety  Ambulation/Gait Ambulation/Gait assistance:  (pt already walke 510' with OT this AM)                      Balance Overall balance assessment: Needs assistance Sitting-balance support: Feet supported;No upper extremity supported Sitting balance-Leahy Scale: Good     Standing balance support: Bilateral upper extremity supported;No upper extremity supported Standing balance-Leahy Scale: Fair                      Cognition Arousal/Alertness: Awake/alert Behavior During Therapy: WFL for tasks  assessed/performed Overall Cognitive Status: Within Functional Limits for tasks assessed                      Exercises Total Joint Exercises Ankle Circles/Pumps: AROM;Both;10 reps Quad Sets: AROM;Left;10 reps Short Arc Quad: AROM;Left;10 reps Heel Slides: AAROM;Left;10 reps Hip ABduction/ADduction: AAROM;Left;10 reps;Supine;Standing Long Arc Quad: AROM;Left;10 reps Knee Flexion: AROM;Left;10 reps;Standing Marching in Standing: AROM;Left;10 reps;Standing Standing Hip Extension: AROM;Left;10 reps;Standing        Pertinent Vitals/Pain Pain Assessment: 0-10 Pain Score: 8  Pain Location: left thigh Pain Descriptors / Indicators: Aching;Burning Pain Intervention(s): Limited activity within patient's tolerance;Monitored during session;Repositioned;RN gave pain meds during session;Ice applied           PT Goals (current goals can now be found in the care plan section) Acute Rehab PT Goals 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 [place], [place], DPT [phone]  10/26/2015, 2:19 PM

## 2015-10-26 NOTE — Discharge Summary (Signed)
Patient ID: Tom Goodman MRN: [id number] DOB/AGE: [birth year] 65 y.o.  Admit date: 10/25/2015 Discharge date: 10/26/2015  Admission Diagnoses:  Principal Problem:   Primary osteoarthritis of left hip   Discharge Diagnoses:  Same  Past Medical History  Diagnosis Date  . Coronary atherosclerosis of native coronary artery     DES to OM1 on 03/09/09, DES to PL branch on 03/22/09 by Dr. Leonia Reeves, nuclear stress test 2013 - low risk,  . Essential hypertension, benign   . Intermediate coronary syndrome ([place])   . Mixed hyperlipidemia   . Epigastric abdominal pain   . Hypercholesteremia   . GERD (gastroesophageal reflux disease)   . Hypercholesteremia   . Chest pain   . Sleep apnea     no cpap  . Osteoarthritis     left leg,back  . Heart murmur     found at 17 when in [employer], never had problems  . Chronic kidney disease     stage  3  . Kidney stones   . Diarrhea, functional   . Cataracts, bilateral     Surgeries: Procedure(s): TOTAL HIP ARTHROPLASTY ANTERIOR APPROACH on 10/25/2015   Consultants:    Discharged Condition: Improved  Hospital Course: Tom Goodman is an 65 y.o. male who was admitted 10/25/2015 for operative treatment ofPrimary osteoarthritis of left hip. Patient has severe unremitting pain that affects sleep, daily activities, and work/hobbies. After pre-op clearance the patient was taken to the operating room on 10/25/2015 and underwent  Procedure(s): TOTAL HIP ARTHROPLASTY ANTERIOR APPROACH.    Patient was given perioperative antibiotics: Anti-infectives    Start     Dose/Rate Route Frequency Ordered Stop   10/25/15 1600  ceFAZolin (ANCEF) IVPB 2 g/50 mL premix     2 g 100 mL/hr over 30 Minutes Intravenous Every 6 hours 10/25/15 1259 10/25/15 2215   10/25/15 0830  ceFAZolin (ANCEF) IVPB 2 g/50 mL premix     2 g 100 mL/hr over 30 Minutes Intravenous To [facility]Stay [facility] 10/24/15 1340 10/25/15 0957       Patient was given sequential compression devices, early  ambulation, and chemoprophylaxis to prevent DVT.  Patient benefited maximally from hospital stay and there were no complications.    Recent vital signs: Patient Vitals for the past 24 hrs:  BP Temp Temp src Pulse Resp SpO2 Height Weight  10/26/15 0539 128/63 mmHg 98 F (36.7 C) Oral 79 16 98 % - -  10/25/15 2352 (!) 135/58 mmHg 98.5 F (36.9 C) Oral 83 16 97 % - -  10/25/15 2201 (!) 137/59 mmHg 100.2 F (37.9 C) Oral 80 18 95 % - -  10/25/15 1418 (!) 122/59 mmHg 97.8 F (36.6 C) - - - - - -  10/25/15 1330 (!) 123/56 mmHg 97.8 F (36.6 C) Oral 73 - 99 % - -  10/25/15 1245 123/68 mmHg 97.6 F (36.4 C) - - - - - -  10/25/15 1235 129/63 mmHg - - 67 (!) 25 99 % - -  10/25/15 1220 124/68 mmHg - - (!) 54 11 100 % - -  10/25/15 1150 136/74 mmHg 97.5 F (36.4 C) - 71 (!) 7 100 % - -  10/25/15 0753 (!) 114/59 mmHg 97.9 F (36.6 C) Oral 60 20 97 % 5\' 10"  (1.778 m) 83.008 kg (183 lb)     Recent laboratory studies: No results for input(s): WBC, HGB, HCT, PLT, NA, K, CL, CO2, BUN, CREATININE, GLUCOSE, INR, CALCIUM in the last 72 hours.  Invalid input(s): PT,  2   Discharge Medications:     Medication List    STOP taking these medications        aspirin 81 MG tablet  Replaced by:  aspirin 325 MG EC tablet     HYDROcodone-acetaminophen 10-325 MG tablet  Commonly known as:  NORCO      TAKE these medications        aspirin 325 MG EC tablet  Take 1 tablet (325 mg total) by mouth 2 (two) times daily after a meal.     atorvastatin 40 MG tablet  Commonly known as:  LIPITOR  Take 1 tablet (40 mg total) by mouth daily.     citalopram 20 MG tablet  Commonly known as:  CELEXA  Take 20 mg by mouth daily.     clopidogrel 75 MG tablet  Commonly known as:  PLAVIX  Take 1 tablet (75 mg total) by mouth daily.     gabapentin 300 MG capsule  Commonly known as:  NEURONTIN  Take 300 mg by mouth 2 (two) times daily.     isosorbide mononitrate 30 MG 24 hr tablet  Commonly known as:  IMDUR   Take 1 tablet (30 mg total) by mouth daily.     lisinopril 10 MG tablet  Commonly known as:  PRINIVIL,ZESTRIL  Take 10 mg by mouth daily.     loperamide 2 MG capsule  Commonly known as:  IMODIUM  Take 2 mg by mouth as needed for diarrhea or loose stools.     methocarbamol 500 MG tablet  Commonly known as:  ROBAXIN  Take 1 tablet (500 mg total) by mouth every 6 (six) hours as needed for muscle spasms.     multivitamin with minerals Tabs tablet  Take 1 tablet by mouth daily.     nitroGLYCERIN 0.4 MG SL tablet  Commonly known as:  NITROSTAT  Place 1 tablet (0.4 mg total) under the tongue every 5 (five) minutes x 3 doses as needed for chest pain.     oxyCODONE-acetaminophen 5-325 MG tablet  Commonly known as:  ROXICET  Take 1-2 tablets by mouth every 4 (four) hours as needed for moderate pain or severe pain.     pantoprazole 40 MG tablet  Commonly known as:  PROTONIX  Take 1 tablet (40 mg total) by mouth daily at 12 noon.     TRUNATURE DIGESTIVE PROBIOTIC Caps  Take 1 capsule by mouth daily.        Diagnostic Studies: Dg Hip Operative Unilat With Pelvis Left  10/25/2015  CLINICAL DATA:  Left total hip arthroplasty. EXAM: OPERATIVE LEFT HIP (WITH PELVIS IF PERFORMED)  VIEWS TECHNIQUE: Fluoroscopic spot image(s) were submitted for interpretation post-operatively. COMPARISON:  None. FINDINGS: Two fluoroscopic images demonstrate left total hip arthroplasty, with long stem femoral component. The orthopedic hardware is well seated. The alignment is anatomic. There is no evidence of fracture. IMPRESSION: Intraoperative fluoroscopic images from left total hip arthroplasty without evidence of immediate complications. Electronically Signed   By: Fidela Salisbury M.D.   On: 10/25/2015 11:46    Disposition: 01-Home or Self Care      Discharge Instructions    Call MD / Call 911    Complete by:  As directed   If you experience chest pain or shortness of breath, CALL 911 and be  transported to the hospital emergency room.  If you develope a fever above 101 F, pus (white drainage) or increased drainage or redness at the wound, or calf pain, call your  surgeon's office.     Constipation Prevention    Complete by:  As directed   Drink plenty of fluids.  Prune juice may be helpful.  You may use a stool softener, such as Colace (over the counter) 100 mg twice a day.  Use MiraLax (over the counter) for constipation as needed.     Diet - low sodium heart healthy    Complete by:  As directed      Discharge instructions    Complete by:  As directed   INSTRUCTIONS AFTER JOINT REPLACEMENT   Remove items at home which could result in a fall. This includes throw rugs or furniture in walking pathways ICE to the affected joint every three hours while awake for 30 minutes at a time, for at least the first 3-5 days, and then as needed for pain and swelling.  Continue to use ice for pain and swelling. You may notice swelling that will progress down to the foot and ankle.  This is normal after surgery.  Elevate your leg when you are not up walking on it.   Continue to use the breathing machine you got in the hospital (incentive spirometer) which will help keep your temperature down.  It is common for your temperature to cycle up and down following surgery, especially at night when you are not up moving around and exerting yourself.  The breathing machine keeps your lungs expanded and your temperature down.   DIET:  As you were doing prior to hospitalization, we recommend a well-balanced diet.  DRESSING / WOUND CARE / SHOWERING  You may shower 3 days after surgery, but keep the wounds dry during showering.  You may use an occlusive plastic wrap (Press'n Seal for example), NO SOAKING/SUBMERGING IN THE BATHTUB.  If the bandage gets wet, change with a clean dry gauze.  If the incision gets wet, pat the wound dry with a clean towel.  ACTIVITY  Increase activity slowly as tolerated, but follow  the weight bearing instructions below.   No driving for 6 weeks or until further direction given by your physician.  You cannot drive while taking narcotics.  No lifting or carrying greater than 10 lbs. until further directed by your surgeon. Avoid periods of inactivity such as sitting longer than an hour when not asleep. This helps prevent blood clots.  You may return to work once you are authorized by your doctor.     WEIGHT BEARING   Weight bearing as tolerated with assist device (walker, cane, etc) as directed, use it as long as suggested by your surgeon or therapist, typically at least 4-6 weeks.   EXERCISES  Results after joint replacement surgery are often greatly improved when you follow the exercise, range of motion and muscle strengthening exercises prescribed by your doctor. Safety measures are also important to protect the joint from further injury. Any time any of these exercises cause you to have increased pain or swelling, decrease what you are doing until you are comfortable again and then slowly increase them. If you have problems or questions, call your caregiver or physical therapist for advice.   Rehabilitation is important following a joint replacement. After just a few days of immobilization, the muscles of the leg can become weakened and shrink (atrophy).  These exercises are designed to build up the tone and strength of the thigh and leg muscles and to improve motion. Often times heat used for twenty to thirty minutes before working out will loosen up your tissues  and help with improving the range of motion but do not use heat for the first two weeks following surgery (sometimes heat can increase post-operative swelling).   These exercises can be done on a training (exercise) mat, on the floor, on a table or on a bed. Use whatever works the best and is most comfortable for you.    Use music or television while you are exercising so that the exercises are a pleasant break in  your day. This will make your life better with the exercises acting as a break in your routine that you can look forward to.   Perform all exercises about fifteen times, three times per day or as directed.  You should exercise both the operative leg and the other leg as well.   Exercises include:   Quad Sets - Tighten up the muscle on the front of the thigh (Quad) and hold for 5-10 seconds.   Straight Leg Raises - With your knee straight (if you were given a brace, keep it on), lift the leg to 60 degrees, hold for 3 seconds, and slowly lower the leg.  Perform this exercise against resistance later as your leg gets stronger.  Leg Slides: Lying on your back, slowly slide your foot toward your buttocks, bending your knee up off the floor (only go as far as is comfortable). Then slowly slide your foot back down until your leg is flat on the floor again.  Angel Wings: Lying on your back spread your legs to the side as far apart as you can without causing discomfort.  Hamstring Strength:  Lying on your back, push your heel against the floor with your leg straight by tightening up the muscles of your buttocks.  Repeat, but this time bend your knee to a comfortable angle, and push your heel against the floor.  You may put a pillow under the heel to make it more comfortable if necessary.   A rehabilitation program following joint replacement surgery can speed recovery and prevent re-injury in the future due to weakened muscles. Contact your doctor or a physical therapist for more information on knee rehabilitation.    CONSTIPATION  Constipation is defined medically as fewer than three stools per week and severe constipation as less than one stool per week.  Even if you have a regular bowel pattern at home, your normal regimen is likely to be disrupted due to multiple reasons following surgery.  Combination of anesthesia, postoperative narcotics, change in appetite and fluid intake all can affect your bowels.    YOU MUST use at least one of the following options; they are listed in order of increasing strength to get the job done.  They are all available over the counter, and you may need to use some, POSSIBLY even all of these options:    Drink plenty of fluids (prune juice may be helpful) and high fiber foods Colace 100 mg by mouth twice a day  Senokot for constipation as directed and as needed Dulcolax (bisacodyl), take with full glass of water  Miralax (polyethylene glycol) once or twice a day as needed.  If you have tried all these things and are unable to have a bowel movement in the first 3-4 days after surgery call either your surgeon or your primary doctor.    If you experience loose stools or diarrhea, hold the medications until you stool forms back up.  If your symptoms do not get better within 1 week or if they get worse, check  with your doctor.  If you experience "the worst abdominal pain ever" or develop nausea or vomiting, please contact the office immediately for further recommendations for treatment.   ITCHING:  If you experience itching with your medications, try taking only a single pain pill, or even half a pain pill at a time.  You can also use Benadryl over the counter for itching or also to help with sleep.   TED HOSE STOCKINGS:  Use stockings on both legs until for at least 2 weeks or as directed by physician office. They may be removed at night for sleeping.  MEDICATIONS:  See your medication summary on the "After Visit Summary" that nursing will review with you.  You may have some home medications which will be placed on hold until you complete the course of blood thinner medication.  It is important for you to complete the blood thinner medication as prescribed.  PRECAUTIONS:  If you experience chest pain or shortness of breath - call 911 immediately for transfer to the hospital emergency department.   If you develop a fever greater that 101 F, purulent drainage from wound,  increased redness or drainage from wound, foul odor from the wound/dressing, or calf pain - CONTACT YOUR SURGEON.                                                   FOLLOW-UP APPOINTMENTS:  If you do not already have a post-op appointment, please call the office for an appointment to be seen by your surgeon.  Guidelines for how soon to be seen are listed in your "After Visit Summary", but are typically between 1-4 weeks after surgery.  OTHER INSTRUCTIONS:   Knee Replacement:  Do not place pillow under knee, focus on keeping the knee straight while resting. CPM instructions: 0-90 degrees, 2 hours in the morning, 2 hours in the afternoon, and 2 hours in the evening. Place foam block, curve side up under heel at all times except when in CPM or when walking.  DO NOT modify, tear, cut, or change the foam block in any way.  MAKE SURE YOU:  Understand these instructions.  Get help right away if you are not doing well or get worse.    Thank you for letting us be a part of your medical care team.  It is a privilege we respect greatly.  We hope these instructions will help you stay on track for a fast and full recovery!     Increase activity slowly as tolerated    Complete by:  As directed            Follow-up Information    Follow up with Hessie Dibble, MD. Schedule an appointment as soon as possible for a visit in 2 weeks.   Specialty:  Orthopedic Surgery   Contact information:   [place] [address] [phone]        Signed: Rich Fuchs 10/26/2015, 7:47 AM

## 2015-10-26 NOTE — Progress Notes (Signed)
Patient for discharge, no acute distress noted. Medications and discharge instructions explained to the patient and his daughter-Tammy. They verbalized understanding. Copies given to them including original prescriptions.

## 2015-10-26 NOTE — Progress Notes (Signed)
Occupational Therapy Evaluation/Discharge Patient Details Name: Tom Goodman MRN: [id number] DOB: [birth year] Today's Date: 10/26/2015    History of Present Illness Pt admitted for L THA. PMHx: GERD, OA   Clinical Impression   PTA, pt was independent with all ADLs and functional mobility and worked as a Curator. Pt currently requires supervision for all functional transfers and min assist for LB ADLs which his wife can provide at home. Reviewed compensatory strategies for ADLs, fall prevention, energy conservation and pain/edema management with pt this session. Pt plans to d/c home with 24/7 assistance from his wife and adult children. All education has been completed and pt has no further questions. OT signing off. Thank you for this referral.    Follow Up Recommendations  No OT follow up;Supervision - Intermittent    Equipment Recommendations  3 in 1 bedside comode;Other (comment) (RW-2 wheeled)    Recommendations for Other Services       Precautions / Restrictions Precautions Precautions: Fall Restrictions Weight Bearing Restrictions: Yes RLE Weight Bearing: Weight bearing as tolerated      Mobility Bed Mobility Overal bed mobility: Modified Independent             General bed mobility comments: HOB elevated, no use of bedrails to simulate home environment  Transfers Overall transfer level: Needs assistance Equipment used: Rolling walker (2 wheeled) Transfers: Sit to/from Stand Sit to Stand: Supervision         General transfer comment: Supervision for safety. Verbal cues for safe hand placement x1    Balance Overall balance assessment: Needs assistance Sitting-balance support: No upper extremity supported;Feet supported Sitting balance-Leahy Scale: Good     Standing balance support: No upper extremity supported;During functional activity Standing balance-Leahy Scale: Fair Standing balance comment: Able to maintain static balance while  standing at sink completing grooming tasks without UE support                            ADL Overall ADL's : Needs assistance/impaired     Grooming: Oral care;Wash/dry hands;Supervision/safety;Standing               Lower Body Dressing: Minimal assistance;Cueing for compensatory techniques;Sit to/from stand Lower Body Dressing Details (indicate cue type and reason): Able to reach R foot to loop underwear and pants on - min assist for L side Toilet Transfer: Supervision/safety;Cueing for safety;Ambulation;BSC;RW Toilet Transfer Details (indicate cue type and reason): BSC over toilet Toileting- Clothing Manipulation and Hygiene: Supervision/safety;Sit to/from stand   Tub/ Shower Transfer: Tub transfer;Supervision/safety;Cueing for sequencing;Ambulation;Tub bench;Rolling walker Tub/Shower Transfer Details (indicate cue type and reason): Cues for different techniques to elevate LLE over tub  Functional mobility during ADLs: Supervision/safety;Rolling walker General ADL Comments: Practiced all functional transfers and LB ADLs (pt will have assist from wife). Reviewed fall prevention and pain/edema management strategies. Pt also ambulated 500' this session.     Vision Vision Assessment?: No apparent visual deficits   Perception     Praxis      Pertinent Vitals/Pain Pain Assessment: 0-10 Pain Score: 3  Pain Location: L hip and L knee Pain Descriptors / Indicators: Aching;Sore Pain Intervention(s): Limited activity within patient's tolerance;Monitored during session;Premedicated before session;Repositioned;Ice applied     Hand Dominance Right   Extremity/Trunk Assessment Upper Extremity Assessment Upper Extremity Assessment: Overall WFL for tasks assessed   Lower Extremity Assessment Lower Extremity Assessment: LLE deficits/detail LLE Deficits / Details: decreased ROM and strength as expected post op  Cervical / Trunk Assessment Cervical / Trunk Assessment:  Normal   Communication Communication Communication: No difficulties   Cognition Arousal/Alertness: Awake/alert Behavior During Therapy: WFL for tasks assessed/performed Overall Cognitive Status: Within Functional Limits for tasks assessed                     General Comments       Exercises       Shoulder Instructions      Home Living Family/patient expects to be discharged to:: Private residence Living Arrangements: Spouse/significant other Available Help at Discharge: Family;Available 24 hours/day Type of Home: House Home Access: Stairs to enter [employer] of Steps: 4 Entrance Stairs-Rails: None Home Layout: One level     Bathroom Shower/Tub: Tub/shower unit;Curtain Shower/tub characteristics: Architectural technologist: Standard Bathroom Accessibility: Yes How Accessible: Accessible via walker Home Equipment: Tub bench;Hand held shower head          Prior Functioning/Environment Level of Independence: Independent        Comments: 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[place], likes sports ([employer])    OT Diagnosis: Acute pain   OT Problem List: Decreased strength;Decreased range of motion;Decreased activity tolerance;Impaired balance (sitting and/or standing);Decreased coordination;Decreased safety awareness;Decreased knowledge of use of DME or AE;Decreased knowledge of precautions;Pain   OT Treatment/Interventions:      OT Goals(Current goals can be found in the care plan section) Acute Rehab OT Goals Patient Stated Goal: To return to work as a Corporate treasurer OT Goal Formulation: With patient Time For Goal Achievement: 11/09/15 Potential to Achieve Goals: Good  OT Frequency:     Barriers to D/C:            Co-evaluation              End of Session Equipment Utilized During Treatment: Gait belt;Rolling walker Nurse Communication: Mobility status  Activity Tolerance: Patient tolerated treatment  well Patient left: in bed;with call bell/phone within reach;with family/visitor present   Time: [id number] OT Time Calculation (min): 33 min Charges:  OT General Charges $OT Visit: 1 Procedure OT Evaluation $OT Eval Moderate Complexity: 1 Procedure OT Treatments $Self Care/Home Management : 8-22 mins G-Codes:    Redmond Baseman, OTR/L Pager: [phone] 10/26/2015, 9:35 AM

## 2015-10-26 NOTE — Care Management Note (Signed)
Case Management Note  Patient Details  Name: Tom Goodman MRN: [id number] Date of Birth: 1951-01-11  Subjective/Objective:  65 yr old gentleman s/p left total hip arthroplasty.     Action/Plan:  Case manager spoke with patient and family concerning home health and DME needs. Patient was preoperatively setup with [facility], no changes. Case manager faxed orders and demographics to [facility] Liaison @ [phone]. Patient has family support at discharge.    Expected Discharge Date:    10/26/15              Expected Discharge Plan:  [place]  In-House Referral:     Discharge planning Services  CM Consult  Post Acute Care Choice:  Durable Medical Equipment, Home Health Choice offered to:  Patient  DME Arranged:  3-N-1, Walker rolling DME Agency:  [place]:  PT [place]:  [place]  Status of Service:  Completed, signed off  Medicare Important Message Given:    Date Medicare IM Given:    Medicare IM give by:    Date Additional Medicare IM Given:    Additional Medicare Important Message give by:     If discussed at [place] of Stay Meetings, dates discussed:    Additional Comments:  Ninfa Meeker, RN 10/26/2015, 4:05 PM

## 2015-10-26 NOTE — Progress Notes (Signed)
Physical Therapy Treatment Patient Details Name: Tom Goodman MRN: [id number] DOB: July 06, 1951 Today's Date: 10/26/2015    History of Present Illness Pt admitted for L THA. PMHx: GERD, OA    PT Comments    Session #2 today: Pt walked another lap this afternoon around the unit, and showed proficiency at stair training simulating home entry.  He plans to d/c this afternoon. PT will follow until d/c confirmed.    Follow Up Recommendations  Home health PT     Equipment Recommendations  3in1 (PT);Rolling walker with 5" wheels    Recommendations for Other Services   NA     Precautions / Restrictions Precautions Precautions: None Restrictions RLE Weight Bearing: Weight bearing as tolerated    Mobility  Bed Mobility Overal bed mobility: Modified Independent Bed Mobility: Supine to Sit     Supine to sit: Modified independent (Device/Increase time)    General bed mobility comments: Pt able to get to EOB unassisted, used bed rail for minimal leverage  Transfers Overall transfer level: Needs assistance Equipment used: Rolling walker (2 wheeled) Transfers: Sit to/from Stand Sit to Stand: Modified independent (Device/Increase time)         General transfer comment: Pt stood quickly and easily with hands for balance during transitions.   Ambulation/Gait Ambulation/Gait assistance: Supervision Ambulation Distance (Feet): 510 Feet Assistive device: Rolling walker (2 wheeled) Gait Pattern/deviations: Step-through pattern;Antalgic   Gait velocity interpretation: Below normal speed for age/gender General Gait Details: mildly antalgic gait pattern which improved with increased distance.     Stairs Stairs: Yes Stairs assistance: Min guard Stair Management: No rails;Backwards;Step to pattern Number of Stairs: 3 General stair comments: cues for LE sequencing and technique to reinforced what he practiced yesterday.  Min guard assist only needed to lightly stabilize the RW.           Balance Overall balance assessment: Needs assistance Sitting-balance support: No upper extremity supported;Feet supported Sitting balance-Leahy Scale: Good     Standing balance support: Bilateral upper extremity supported;Single extremity supported;No upper extremity supported Standing balance-Leahy Scale: Fair                      Cognition Arousal/Alertness: Awake/alert Behavior During Therapy: WFL for tasks assessed/performed Overall Cognitive Status: Within Functional Limits for tasks assessed                             Pertinent Vitals/Pain Pain Assessment: 0-10 Pain Score: 4  Pain Location: left leg Pain Descriptors / Indicators: Aching;Burning Pain Intervention(s): Limited activity within patient's tolerance;Monitored during session;Repositioned           PT Goals (current goals can now be found in the care plan section) Acute Rehab PT Goals Patient 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 Miller, [place], DPT [phone]   10/26/2015, 2:54 PM

## 2015-11-14 ENCOUNTER — Ambulatory Visit (HOSPITAL_COMMUNITY)
Admission: RE | Admit: 2015-11-14 | Payer: BLUE CROSS/BLUE SHIELD | Source: Ambulatory Visit | Admitting: Gastroenterology

## 2015-11-14 ENCOUNTER — Encounter (HOSPITAL_COMMUNITY): Admission: RE | Payer: Self-pay | Source: Ambulatory Visit

## 2015-11-14 SURGERY — COLONOSCOPY WITH PROPOFOL
Anesthesia: Monitor Anesthesia Care

## 2015-11-24 ENCOUNTER — Other Ambulatory Visit: Payer: Self-pay | Admitting: *Deleted

## 2015-11-24 MED ORDER — ATORVASTATIN CALCIUM 40 MG PO TABS: 40.0000 mg | ORAL_TABLET | Freq: Every day | ORAL | Status: DC

## 2016-02-07 ENCOUNTER — Other Ambulatory Visit: Payer: Self-pay | Admitting: Gastroenterology

## 2016-02-21 ENCOUNTER — Other Ambulatory Visit: Payer: Self-pay

## 2016-02-21 MED ORDER — LISINOPRIL 10 MG PO TABS: 10.0000 mg | ORAL_TABLET | Freq: Every day | ORAL | Status: DC

## 2016-03-13 ENCOUNTER — Encounter (HOSPITAL_COMMUNITY): Payer: Self-pay | Admitting: *Deleted

## 2016-03-20 ENCOUNTER — Ambulatory Visit (HOSPITAL_COMMUNITY): Payer: BLUE CROSS/BLUE SHIELD | Admitting: Certified Registered Nurse Anesthetist

## 2016-03-20 ENCOUNTER — Encounter (HOSPITAL_COMMUNITY)
Admission: RE | Disposition: A | Discharge status: Home / Self Care | Payer: Self-pay | Source: Ambulatory Visit | Attending: Gastroenterology

## 2016-03-20 ENCOUNTER — Encounter (HOSPITAL_COMMUNITY): Payer: Self-pay

## 2016-03-20 ENCOUNTER — Ambulatory Visit (HOSPITAL_COMMUNITY)
Admission: RE | Admit: 2016-03-20 | Discharge: 2016-03-20 | Disposition: A | Discharge status: Home / Self Care | Payer: BLUE CROSS/BLUE SHIELD | Source: Ambulatory Visit | Attending: Gastroenterology | Admitting: Gastroenterology

## 2016-03-20 DIAGNOSIS — Z1211 Encounter for screening for malignant neoplasm of colon: Secondary | ICD-10-CM | POA: Diagnosis present

## 2016-03-20 DIAGNOSIS — Z955 Presence of coronary angioplasty implant and graft: Secondary | ICD-10-CM | POA: Diagnosis not present

## 2016-03-20 DIAGNOSIS — K219 Gastro-esophageal reflux disease without esophagitis: Secondary | ICD-10-CM | POA: Diagnosis not present

## 2016-03-20 DIAGNOSIS — D123 Benign neoplasm of transverse colon: Secondary | ICD-10-CM | POA: Insufficient documentation

## 2016-03-20 DIAGNOSIS — I2510 Atherosclerotic heart disease of native coronary artery without angina pectoris: Secondary | ICD-10-CM | POA: Diagnosis not present

## 2016-03-20 DIAGNOSIS — G4733 Obstructive sleep apnea (adult) (pediatric): Secondary | ICD-10-CM | POA: Insufficient documentation

## 2016-03-20 DIAGNOSIS — Z7982 Long term (current) use of aspirin: Secondary | ICD-10-CM | POA: Insufficient documentation

## 2016-03-20 DIAGNOSIS — I10 Essential (primary) hypertension: Secondary | ICD-10-CM | POA: Insufficient documentation

## 2016-03-20 DIAGNOSIS — Z7901 Long term (current) use of anticoagulants: Secondary | ICD-10-CM | POA: Insufficient documentation

## 2016-03-20 DIAGNOSIS — M1990 Unspecified osteoarthritis, unspecified site: Secondary | ICD-10-CM | POA: Diagnosis not present

## 2016-03-20 HISTORY — PX: COLONOSCOPY WITH PROPOFOL: SHX5780

## 2016-03-20 SURGERY — COLONOSCOPY WITH PROPOFOL
Anesthesia: Monitor Anesthesia Care

## 2016-03-20 MED ORDER — PROPOFOL 10 MG/ML IV BOLUS
INTRAVENOUS | Status: AC
  Filled 2016-03-20: qty 40

## 2016-03-20 MED ORDER — SODIUM CHLORIDE 0.9 % IV SOLN: INTRAVENOUS | Status: DC

## 2016-03-20 MED ORDER — LACTATED RINGERS IV SOLN
INTRAVENOUS | Status: DC
  Administered 2016-03-20: 1000 mL via INTRAVENOUS

## 2016-03-20 MED ORDER — PROPOFOL 10 MG/ML IV BOLUS
INTRAVENOUS | Status: DC | PRN
  Administered 2016-03-20: 100 mg via INTRAVENOUS
  Administered 2016-03-20: 30 mg via INTRAVENOUS
  Administered 2016-03-20: 50 mg via INTRAVENOUS
  Administered 2016-03-20: 30 mg via INTRAVENOUS

## 2016-03-20 SURGICAL SUPPLY — 21 items

## 2016-03-20 NOTE — Anesthesia Postprocedure Evaluation (Signed)
Anesthesia Post Note  Patient: Tom Goodman  Procedure(s) Performed: Procedure(s) (LRB): COLONOSCOPY WITH PROPOFOL (N/A)  Patient location during evaluation: Endoscopy Anesthesia Type: MAC Level of consciousness: awake and alert, oriented and patient cooperative Pain management: pain level controlled Vital Signs Assessment: post-procedure vital signs reviewed and stable Respiratory status: nonlabored ventilation, spontaneous breathing and respiratory function stable Cardiovascular status: blood pressure returned to baseline and stable Postop Assessment: no signs of nausea or vomiting Anesthetic complications: no    Last Vitals:  Filed Vitals:   03/20/16 0830 03/20/16 0840  BP: 137/63 126/70  Pulse: 58 63  Temp:    Resp: 7 16    Last Pain: There were no vitals filed for this visit.               Midge Minium

## 2016-03-20 NOTE — Discharge Instructions (Signed)

## 2016-03-20 NOTE — Anesthesia Preprocedure Evaluation (Addendum)
Anesthesia Evaluation  Patient identified by MRN, date of birth, ID band Patient awake    Reviewed: Allergy & Precautions, NPO status , Patient's Chart, lab work & pertinent test results  History of Anesthesia Complications Negative for: history of anesthetic complications  Airway Mallampati: II  TM Distance: >3 FB Neck ROM: Full    Dental  (+) Teeth Intact, Dental Advisory Given   Pulmonary sleep apnea (does not need CPAP) ,    breath sounds clear to auscultation       Cardiovascular hypertension, Pt. on medications (-) angina+ CAD and + Cardiac Stents   Rhythm:Regular Rate:Normal  "15 cath:  occluded obtuse marginal stent, 50% RCA, patent DES in the posterior lateral branch. Collateral blood flow was noted to obtuse marginal. Normal EF '13 stress test: low risk '12 ECHO: EF 60-65%, valves OK   Neuro/Psych Chronic Back pain    GI/Hepatic Neg liver ROS, GERD  Medicated and Controlled,  Endo/Other  negative endocrine ROS  Renal/GU negative Renal ROS     Musculoskeletal  (+) Arthritis ,   Abdominal   Peds  Hematology negative hematology ROS (+)   Anesthesia Other Findings   Reproductive/Obstetrics                         Anesthesia Physical Anesthesia Plan  ASA: III  Anesthesia Plan: MAC   Post-op Pain Management:    Induction: Intravenous  Airway Management Planned: Natural Airway and Nasal Cannula  Additional Equipment:   Intra-op Plan:   Post-operative Plan:   Informed Consent: I have reviewed the patients History and Physical, chart, labs and discussed the procedure including the risks, benefits and alternatives for the proposed anesthesia with the patient or authorized representative who has indicated his/her understanding and acceptance.   Dental advisory given  Plan Discussed with: CRNA and Surgeon  Anesthesia Plan Comments: (Plan routine monitors, MAC)         Anesthesia Quick Evaluation

## 2016-03-20 NOTE — H&P (Signed)
  Procedure: Screening colonoscopy. 09/14/2004 normal screening colonoscopy was performed. Chronic aspirin and Plavix therapy following placement of a drug eluting coronary artery stents. Obstructive sleep apnea syndrome.  History: The patient is a 65 year old male born Mar 03, 1951. He is scheduled to undergo a repeat screening colonoscopy today. He stopped taking Plavix and aspirin one week ago.  Past medical history: Hypertension. Gastroesophageal reflux. Cataracts. Hypercholesterolemia. Obstructive sleep apnea syndrome. Coronary artery disease. Coronary artery stent placement. Right upper lobe pulmonary nodule. Chronic back pain. Bilateral thumb surgery. Bilateral shoulder arthroscopy. Lumbar epidural injection performed in 2015.  Medication allergies: None  Exam: The patient is alert and lying comfortably on the endoscopy stretcher. Abdomen is soft and nontender to palpation. Lungs are clear to auscultation. Cardiac exam reveals a regular rhythm.  Plan: Proceed with screening colonoscopy

## 2016-03-20 NOTE — Transfer of Care (Signed)
Immediate Anesthesia Transfer of Care Note  Patient: Tom Goodman  Procedure(s) Performed: Procedure(s): COLONOSCOPY WITH PROPOFOL (N/A)  Patient Location: PACU  Anesthesia Type:MAC  Level of Consciousness: awake, alert  and oriented  Airway & Oxygen Therapy: Patient Spontanous Breathing and Patient connected to face mask oxygen  Post-op Assessment: Report given to RN and Post -op Vital signs reviewed and stable  Post vital signs: Reviewed and stable  Last Vitals:  Filed Vitals:   03/20/16 0628  BP: 148/74  Pulse: 58  Temp: 37.1 C  Resp: 12    Last Pain: There were no vitals filed for this visit.       Complications: No apparent anesthesia complications

## 2016-03-20 NOTE — Op Note (Addendum)
[facility] Patient Name: Tom Goodman Procedure Date: 03/20/2016 MRN: [id number] Attending MD: Garlan Fair , MD Date of Birth: 29-May-1951 CSN: [id number] Age: 65 Admit Type: Outpatient Procedure:                Colonoscopy Indications:              Screening for colorectal malignant neoplasm Providers:                Garlan Fair, MD, Kingsley Plan, RN, Cherylynn Ridges, Technician Referring MD:              Medicines:                Propofol per Anesthesia Complications:            No immediate complications. Estimated Blood Loss:     Estimated blood loss: none. Procedure:                Pre-Anesthesia Assessment:                           - Prior to the procedure, a History and Physical                            was performed, and patient medications and                            allergies were reviewed. The patient's tolerance of                            previous anesthesia was also reviewed. The risks                            and benefits of the procedure and the sedation                            options and risks were discussed with the patient.                            All questions were answered, and informed consent                            was obtained. Prior Anticoagulants: The patient has                            taken Plavix (clopidogrel), last dose was 7 days                            prior to procedure. ASA Grade Assessment: III - A                            patient with severe systemic disease. After  reviewing the risks and benefits, the patient was                            deemed in satisfactory condition to undergo the                            procedure.                           After obtaining informed consent, the colonoscope                            was passed under direct vision. Throughout the                            procedure, the patient's blood pressure,  pulse, and                            oxygen saturations were monitored continuously. The                            Colonoscope was introduced through the anus and                            advanced to the the cecum, identified by                            appendiceal orifice and ileocecal valve. The                            colonoscopy was performed without difficulty. The                            patient tolerated the procedure well. The quality                            of the bowel preparation was good. The terminal                            ileum, the ileocecal valve, the appendiceal orifice                            and the rectum were photographed. Scope In: 7:59:45 AM Scope Out: 8:13:35 AM Scope Withdrawal Time: 0 hours 10 minutes 7 seconds  Total Procedure Duration: 0 hours 13 minutes 50 seconds  Findings:      The perianal and digital rectal examinations were normal.      A 3 mm polyp was found in the distal transverse colon. The polyp was       sessile. The polyp was removed with a cold biopsy forceps. Resection and       retrieval were complete.      The exam was otherwise without abnormality. Impression:               - One 3 mm polyp in the distal transverse colon,  removed with a cold biopsy forceps. Resected and                            retrieved.                           - The examination was otherwise normal. Moderate Sedation:      N/A- Per Anesthesia Care Recommendation:           - Patient has a contact number available for                            emergencies. The signs and symptoms of potential                            delayed complications were discussed with the                            patient. Return to normal activities tomorrow.                            Written discharge instructions were provided to the                            patient.                           - Repeat colonoscopy date to be determined  after                            pending pathology results are reviewed for                            screening purposes.                           - Resume previous diet.                           - Continue present medications. Procedure Code(s):        --- Professional ---                           [phone], Colonoscopy, flexible; with biopsy, single                            or multiple Diagnosis Code(s):        --- Professional ---                           Z12.11, Encounter for screening for malignant                            neoplasm of colon                           D12.3, Benign neoplasm of transverse colon (hepatic  flexure or splenic flexure) CPT copyright 2016 American Medical Association. All rights reserved. The codes documented in this report are preliminary and upon coder review may  be revised to meet current compliance requirements. [name], MD Garlan Fair, MD 03/20/2016 8:21:11 AM This report has been signed electronically. Number of Addenda: 0

## 2016-03-22 ENCOUNTER — Encounter (HOSPITAL_COMMUNITY): Payer: Self-pay | Admitting: Gastroenterology

## 2016-03-29 ENCOUNTER — Ambulatory Visit (INDEPENDENT_AMBULATORY_CARE_PROVIDER_SITE_OTHER): Payer: BLUE CROSS/BLUE SHIELD | Admitting: Cardiology

## 2016-03-29 ENCOUNTER — Encounter: Payer: Self-pay | Admitting: Cardiology

## 2016-03-29 VITALS — BP 104/52 | HR 76 | Ht 70.0 in | Wt 185.4 lb

## 2016-03-29 DIAGNOSIS — I10 Essential (primary) hypertension: Secondary | ICD-10-CM

## 2016-03-29 DIAGNOSIS — I2510 Atherosclerotic heart disease of native coronary artery without angina pectoris: Secondary | ICD-10-CM

## 2016-03-29 DIAGNOSIS — E782 Mixed hyperlipidemia: Secondary | ICD-10-CM

## 2016-03-29 NOTE — Patient Instructions (Signed)
Medication Instructions:  Please stop Lisinopril. Continue all other medications as listed.  Follow-Up: Follow up in 3 months with Dr Marlou Porch.  If you need a refill on your cardiac medications before your next appointment, please call your pharmacy.  Thank you for choosing [facility]!!

## 2016-03-29 NOTE — Progress Notes (Signed)
[facility]. [address], [place]  [address] Phone: [phone] Fax:  [phone]  Date:  03/29/2016   ID:  Tom Goodman, DOB [birth year], MRN [id number]  PCP:  Kandice Hams, MD   History of Present Illness: Tom Goodman is a 65 y.o. male with coronary artery disease, DES to obtuse marginal one on 03/09/09 (occluded), DES to posterior lateral branch on 03/22/09 by Dr. Leonia Reeves, nuclear stress test 2013 which was low risk, with hypertension, hyperlipidemia here for followup. He was hospitalized on 11/22/13 with symptoms of feeling weak, pale appearance while at church. These are similar symptoms to his prior stenting. He underwent cardiac catheterization which showed occluded obtuse marginal stent, 50% RCA, patent DES in the posterior lateral branch. Collateral blood flow was noted to obtuse marginal. Normal EF. He was started on isosorbide. Troponin was negative.  I take care of his wife Codyjames Salami (has "spells" when she has to groom dogs, ?anxiety).  [place]. Flip phone. He is having some episodes of dizziness. One time he got off of his lawnmower and felt like he was going to faint. Blood pressure was 94 systolic at one point. I decided to discontinue his lisinopril on 03/29/16.  Wt Readings from Last 3 Encounters:  03/29/16 185 lb 6.4 oz (84.097 kg)  03/20/16 183 lb (83.008 kg)  10/25/15 183 lb (83.008 kg)     Past Medical History  Diagnosis Date  . Coronary atherosclerosis of native coronary artery     DES to OM1 on 03/09/09, DES to PL branch on 03/22/09 by Dr. Leonia Reeves, nuclear stress test 2013 - low risk,  . Essential hypertension, benign   . Intermediate coronary syndrome ([place])   . Mixed hyperlipidemia   . Epigastric abdominal pain   . Hypercholesteremia   . GERD (gastroesophageal reflux disease)   . Hypercholesteremia   . Chest pain   . Osteoarthritis     left leg,back  . Heart murmur     found at 17 when in [employer], never had problems  .  Chronic kidney disease     stage  3  . Kidney stones   . Diarrhea, functional   . Cataracts, bilateral   . Sleep apnea     no cpap, needed    Past Surgical History  Procedure Laterality Date  . Cardiac catheterization  2010    Staged PCI of the second vessel because of renal problems  . Bilateral thumb surgery dr Burney Gauze    . Shoulder arthroscopy  2012    Left Feb, Right Sept  . Left heart catheterization with coronary angiogram N/A 11/23/2013    Procedure: LEFT HEART CATHETERIZATION WITH CORONARY ANGIOGRAM;  Surgeon: Jettie Booze, MD;  [facility] CATH LAB;  Service: Cardiovascular;  Laterality: N/A;  . Vasectomy    . Colonoscopy    . Total hip arthroplasty Left 10/25/2015    Procedure: TOTAL HIP ARTHROPLASTY ANTERIOR APPROACH;  Surgeon: Melrose Nakayama, MD;  Location: [facility];  Service: Orthopedics;  Laterality: Left;  . Colonoscopy with propofol N/A 03/20/2016    Procedure: COLONOSCOPY WITH PROPOFOL;  Surgeon: Garlan Fair, MD;  Location: [facility] ENDOSCOPY;  Service: Endoscopy;  Laterality: N/A;    Current Outpatient Prescriptions  Medication Sig Dispense Refill  . aspirin 81 MG tablet Take 81 mg by mouth daily.    Marland Kitchen atorvastatin (LIPITOR) 40 MG tablet Take 1 tablet (40 mg total) by mouth daily. 90 tablet 1  . citalopram (  CELEXA) 20 MG tablet Take 20 mg by mouth daily.     . clopidogrel (PLAVIX) 75 MG tablet Take 1 tablet (75 mg total) by mouth daily. 90 tablet 1  . gabapentin (NEURONTIN) 600 MG tablet Take 600 mg by mouth daily.  2  . HYDROcodone-acetaminophen (NORCO) 10-325 MG tablet Take 1 tablet by mouth 2 (two) times daily as needed for moderate pain.    . isosorbide mononitrate (IMDUR) 30 MG 24 hr tablet Take 1 tablet (30 mg total) by mouth daily. 90 tablet 3  . loperamide (IMODIUM) 2 MG capsule Take 2 mg by mouth as needed for diarrhea or loose stools.    . methocarbamol (ROBAXIN) 500 MG tablet Take 1 tablet (500 mg total) by mouth every 6 (six) hours as needed for  muscle spasms. 50 tablet 0  . Multiple Vitamin (MULTIVITAMIN WITH MINERALS) TABS tablet Take 1 tablet by mouth daily.    . nitroGLYCERIN (NITROSTAT) 0.4 MG SL tablet Place 1 tablet (0.4 mg total) under the tongue every 5 (five) minutes x 3 doses as needed for chest pain. 25 tablet 10  . oxyCODONE-acetaminophen (ROXICET) 5-325 MG tablet Take 1-2 tablets by mouth every 4 (four) hours as needed for moderate pain or severe pain. 50 tablet 0  . pantoprazole (PROTONIX) 40 MG tablet Take 1 tablet (40 mg total) by mouth daily at 12 noon. 90 tablet 1  . Probiotic Product (TRUNATURE DIGESTIVE PROBIOTIC) CAPS Take 1 capsule by mouth daily.     No current facility-administered medications for this visit.    Allergies:    Allergies  Allergen Reactions  . Corticosteroids Swelling    Swelling of lips  . Prednisone Swelling    Swollen lips    Social History:  The patient  reports that he has never smoked. He has never used smokeless tobacco. He reports that he does not drink alcohol or use illicit drugs.   ROS:  Please see the history of present illness.  No chest pain, no shortness of breath.    PHYSICAL EXAM: VS:  BP 104/52 mmHg  Pulse 76  Ht 5\' 10"  (1.778 m)  Wt 185 lb 6.4 oz (84.097 kg)  BMI 26.60 kg/m2 Well nourished, well developed, in no acute distress HEENT: normal Neck: no JVD Cardiac:  normal S1, S2; RRR; no murmur Lungs:  clear to auscultation bilaterally, no wheezing, rhonchi or rales Abd: soft, nontender, no hepatomegaly Ext: no edema Skin: warm and dry Neuro: no focal abnormalities noted  EKG: Today 03/02/15-normal rhythm, 75, no other abnormalities.  Labs: 10/14-LDL 51, creatinine 1.3    ASSESSMENT AND PLAN:  1. Coronary artery disease- Plavix and continue isosorbide. Currently doing well. No symptoms.  No angina. I would consider stopping his isosorbide if his blood pressure does not improve. 2. Hyperlipidemia-on atorvastatin 40. LDL goal less than 70. LDL 51. 3. Chronic  kidney disease stage II-he is trying to avoid NSAIDs. May need other therapeutic options for his arthritis. Getting epidural injections for his back. 4. HTN - Low today. He has had some dizziness at times. Occasionally his blood pressure will run in the 90 range. I'm going to stop his lisinopril 10 mg. previously we had cut this back. Next step would be to stop his isosorbide. No syncope. Just feels weak at times. 5. 3 month follow-up  Signed, Candee Furbish, MD [facility]  03/29/2016 4:37 PM

## 2016-04-26 ENCOUNTER — Other Ambulatory Visit: Payer: Self-pay

## 2016-04-26 MED ORDER — ATORVASTATIN CALCIUM 40 MG PO TABS: 40.0000 mg | ORAL_TABLET | Freq: Every day | ORAL | 0 refills | Status: AC

## 2016-06-29 ENCOUNTER — Encounter: Payer: Self-pay | Admitting: Cardiology

## 2016-06-29 ENCOUNTER — Ambulatory Visit (INDEPENDENT_AMBULATORY_CARE_PROVIDER_SITE_OTHER): Payer: Medicare Other | Admitting: Cardiology

## 2016-06-29 VITALS — BP 118/62 | HR 69 | Ht 71.0 in | Wt 184.4 lb

## 2016-06-29 DIAGNOSIS — R109 Unspecified abdominal pain: Secondary | ICD-10-CM | POA: Diagnosis not present

## 2016-06-29 DIAGNOSIS — I2510 Atherosclerotic heart disease of native coronary artery without angina pectoris: Secondary | ICD-10-CM

## 2016-06-29 DIAGNOSIS — Z23 Encounter for immunization: Secondary | ICD-10-CM | POA: Diagnosis not present

## 2016-06-29 DIAGNOSIS — I10 Essential (primary) hypertension: Secondary | ICD-10-CM

## 2016-06-29 DIAGNOSIS — K921 Melena: Secondary | ICD-10-CM | POA: Diagnosis not present

## 2016-06-29 DIAGNOSIS — E782 Mixed hyperlipidemia: Secondary | ICD-10-CM | POA: Diagnosis not present

## 2016-06-29 NOTE — Progress Notes (Signed)
[facility]. [address], [address] [place], [place]  [address] Phone: [phone] Fax:  [phone]  Date:  06/29/2016   ID:  Tom Goodman, DOB [birth year], MRN [id number]  PCP:  Kandice Hams, MD   History of Present Illness: Tom Goodman is a 65 y.o. male with coronary artery disease, DES to obtuse marginal one on 03/09/09 (occluded), DES to posterior lateral branch on 03/22/09 by Dr. Leonia Reeves, nuclear stress test 2013 which was low risk, with hypertension, hyperlipidemia here for followup. He was hospitalized on 11/22/13 with symptoms of feeling weak, pale appearance while at church. These are similar symptoms to his prior stenting. He underwent cardiac catheterization which showed occluded obtuse marginal stent, 50% RCA, patent DES in the posterior lateral branch. Collateral blood flow was noted to obtuse marginal. Normal EF. He was started on isosorbide. Troponin was negative.  I take care of his wife Dwaun Sosebee (has "spells" when she has to groom dogs, ?anxiety).  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 He has been having some gnawing stomach pain after eating. Dr. polite is working this up. He has had stomach ulcers in the past. He has been under increased stress since losing his job.  Wt Readings from Last 3 Encounters:  06/29/16 184 lb 6.4 oz (83.6 kg)  03/29/16 185 lb 6.4 oz (84.1 kg)  03/20/16 183 lb (83 kg)     Past Medical History:  Diagnosis Date  . Cataracts, bilateral   . Chest pain   . Chronic kidney disease    stage  3  . Coronary atherosclerosis of native coronary artery    DES to OM1 on 03/09/09, DES to PL branch on 03/22/09 by Dr. Leonia Reeves, nuclear stress test 2013 - low risk,  . Diarrhea, functional   . Epigastric abdominal pain   . Essential hypertension, benign   . GERD (gastroesophageal reflux disease)   . 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 Hypercholesteremia   . Hypercholesteremia   .  Intermediate coronary syndrome ([place])   . Kidney stones   . Mixed hyperlipidemia   . Osteoarthritis    left leg,back  . Sleep apnea    no cpap, needed    Past Surgical History:  Procedure Laterality Date  . Bilateral thumb surgery Dr Burney Gauze    . CARDIAC CATHETERIZATION  2010   Staged PCI of the second vessel because of renal problems  . COLONOSCOPY    . COLONOSCOPY WITH PROPOFOL N/A 03/20/2016   Procedure: COLONOSCOPY WITH PROPOFOL;  Surgeon: Garlan Fair, MD;  Location: [facility];  Service: Endoscopy;  Laterality: N/A;  . LEFT HEART CATHETERIZATION WITH CORONARY ANGIOGRAM N/A 11/23/2013   Procedure: LEFT HEART CATHETERIZATION WITH CORONARY ANGIOGRAM;  Surgeon: Jettie Booze, MD;  Location: [facility] CATH LAB;  Service: Cardiovascular;  Laterality: N/A;  . SHOULDER ARTHROSCOPY  2012   Left Feb, Right Sept  . TOTAL HIP ARTHROPLASTY Left 10/25/2015   Procedure: TOTAL HIP ARTHROPLASTY ANTERIOR APPROACH;  Surgeon: Melrose Nakayama, MD;  Location: [facility];  Service: Orthopedics;  Laterality: Left;  Marland Kitchen VASECTOMY      Current Outpatient Prescriptions  Medication Sig Dispense Refill  . aspirin 81 MG tablet Take 81 mg by mouth daily.    Marland Kitchen atorvastatin (LIPITOR) 40 MG tablet Take 1 tablet (40 mg total) by mouth daily. 90 tablet 0  . citalopram (CELEXA) 20 MG tablet Take 20 mg  by mouth daily.     . clopidogrel (PLAVIX) 75 MG tablet Take 1 tablet (75 mg total) by mouth daily. 90 tablet 1  . gabapentin (NEURONTIN) 600 MG tablet Take 600 mg by mouth daily.  2  . HYDROcodone-acetaminophen (NORCO) 10-325 MG tablet Take 1 tablet by mouth 2 (two) times daily as needed for moderate pain.    . isosorbide mononitrate (IMDUR) 30 MG 24 hr tablet Take 1 tablet (30 mg total) by mouth daily. 90 tablet 3  . loperamide (IMODIUM) 2 MG capsule Take 2 mg by mouth as needed for diarrhea or loose stools.    . nitroGLYCERIN (NITROSTAT) 0.4 MG SL tablet Place 1 tablet (0.4 mg total) under the tongue every 5 (five)  minutes x 3 doses as needed for chest pain. 25 tablet 10  . pantoprazole (PROTONIX) 40 MG tablet Take 1 tablet (40 mg total) by mouth daily at 12 noon. 90 tablet 1  . Probiotic Product (TRUNATURE DIGESTIVE PROBIOTIC) CAPS Take 1 capsule by mouth daily.     No current facility-administered medications for this visit.     Allergies:    Allergies  Allergen Reactions  . Corticosteroids Swelling    Swelling of lips  . Prednisone Swelling    Swollen lips    Social History:  The patient  reports that he has never smoked. He has never used smokeless tobacco. He reports that he does not drink alcohol or use drugs.   ROS:  Please see the history of present illness.  No chest pain, no shortness of breath.    PHYSICAL EXAM: VS:  BP 118/62   Pulse 69   Ht 5\' 11"  (1.803 m)   Wt 184 lb 6.4 oz (83.6 kg)   BMI 25.72 kg/m  Well nourished, well developed, in no acute distress HEENT: normal Neck: no JVD Cardiac:  normal S1, S2; RRR; no murmur Lungs:  clear to auscultation bilaterally, no wheezing, rhonchi or rales Abd: soft, nontender, no hepatomegaly Ext: no edema Skin: warm and dry Neuro: no focal abnormalities noted  EKG: Today 06/29/16-sinus rhythm, PACs. Personally viewed-prior 03/02/15-normal rhythm, 75, no other abnormalities.   Labs: 10/14-LDL 51, creatinine 1.3    ASSESSMENT AND PLAN:  1. Coronary artery disease- Plavix and continue isosorbide. Currently doing well. No symptoms.  No angina. 2. Hyperlipidemia-on atorvastatin 40. LDL goal less than 70. LDL 51. 3. Chronic kidney disease stage II-he is trying to avoid NSAIDs. May need other therapeutic options for his arthritis. Getting epidural injections for his back. 4. Hypotension -much improved after stopping lisinopril. Previously was having dizziness, blood pressure running in the 90 range.  5. 12 month follow-up  Signed, Candee Furbish, MD [facility]  06/29/2016 4:40 PM

## 2016-06-29 NOTE — Patient Instructions (Signed)

## 2016-07-05 ENCOUNTER — Ambulatory Visit
Admission: RE | Admit: 2016-07-05 | Discharge: 2016-07-05 | Disposition: A | Discharge status: Home / Self Care | Payer: Medicare Other | Source: Ambulatory Visit | Attending: Internal Medicine | Admitting: Internal Medicine

## 2016-07-05 ENCOUNTER — Other Ambulatory Visit: Payer: Self-pay | Admitting: Internal Medicine

## 2016-07-05 DIAGNOSIS — S9031XA Contusion of right foot, initial encounter: Secondary | ICD-10-CM | POA: Diagnosis not present

## 2016-07-05 DIAGNOSIS — M79671 Pain in right foot: Secondary | ICD-10-CM

## 2016-07-05 DIAGNOSIS — M79673 Pain in unspecified foot: Secondary | ICD-10-CM | POA: Diagnosis not present

## 2016-07-05 DIAGNOSIS — R109 Unspecified abdominal pain: Secondary | ICD-10-CM | POA: Diagnosis not present

## 2016-10-29 ENCOUNTER — Telehealth: Payer: Self-pay | Admitting: Cardiology

## 2016-10-29 NOTE — Telephone Encounter (Signed)
New Message     Pt c/o of Chest Pain: 1. Are you having CP right now? Yes 7-8 pain level   2. Are you experiencing any other symptoms (ex. SOB, nausea, vomiting, sweating)? No  3. How long have you been experiencing CP? 3-4 days 4. Is your CP continuous or coming and going? Mostly continuous  5. Have you taken Nitroglycerin? No  No ER visit. Pt verbalized he was taking antiacid.

## 2016-10-29 NOTE — Telephone Encounter (Signed)
Spoke to patient. He states he has been having chest pain for a few days - 7-8/10, has not used NTG. He wants to see Dr. Marlou Porch and have an EKG. Patient does not wish to go to ED for eval - he states he has been advised to do this in the past and it costs him a lot of money. Offered first available appt - scheduled to see Dr. Marlou Porch 1/31 @ 930am (no sooner MD or APP appointment available). Patient was advised should his symptoms worsen in severity or frequency, should he develop concomitant symptoms of SOB, lightheadedness/dizziness/palpitations etc, he should seek ED eval. Patient voiced understanding.

## 2016-10-31 ENCOUNTER — Encounter: Payer: Self-pay | Admitting: Cardiology

## 2016-10-31 ENCOUNTER — Ambulatory Visit (INDEPENDENT_AMBULATORY_CARE_PROVIDER_SITE_OTHER): Payer: Medicare Other | Admitting: Cardiology

## 2016-10-31 VITALS — BP 130/70 | HR 74 | Ht 70.0 in | Wt 177.1 lb

## 2016-10-31 DIAGNOSIS — E782 Mixed hyperlipidemia: Secondary | ICD-10-CM | POA: Diagnosis not present

## 2016-10-31 DIAGNOSIS — R0789 Other chest pain: Secondary | ICD-10-CM | POA: Diagnosis not present

## 2016-10-31 DIAGNOSIS — I10 Essential (primary) hypertension: Secondary | ICD-10-CM

## 2016-10-31 DIAGNOSIS — E7800 Pure hypercholesterolemia, unspecified: Secondary | ICD-10-CM | POA: Diagnosis not present

## 2016-10-31 NOTE — Patient Instructions (Signed)
Medication Instructions:  The current medical regimen is effective;  continue present plan and medications.  Follow-Up: Follow up in September 2018 with Dr Marlou Porch.  If you need a refill on your cardiac medications before your next appointment, please call your pharmacy.  Thank you for choosing [facility]!!

## 2016-10-31 NOTE — Progress Notes (Signed)
[facility]. [address] [place], [place]  [address] Phone: [phone] Fax:  [phone]  Date:  10/31/2016   ID:  Tom Goodman, DOB 19-Oct-1950, MRN [id number]  PCP:  Kandice Hams, MD   History of Present Illness: Tom Goodman is a 66 y.o. male with coronary artery disease, DES to obtuse marginal one on 03/09/09 (occluded), DES to posterior lateral branch on 03/22/09 by Dr. Leonia Reeves, nuclear stress test 2013 which was low risk, with hypertension, hyperlipidemia here for followup. Chest pain.    He was hospitalized on 11/22/13 with symptoms of feeling weak, pale appearance while at church. These are similar symptoms to his prior stenting. He underwent cardiac catheterization which showed occluded obtuse marginal stent, 50% RCA, patent DES in the posterior lateral branch. Collateral blood flow was noted to obtuse marginal. Normal EF. He was started on isosorbide. Troponin was negative.  I take care of his wife Tom Goodman (has "spells" when she has to groom dogs, ?anxiety).  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 He has been having some gnawing stomach pain after eating. Dr. Delfina Redwood is working this up. He has had stomach ulcers in the past. He has been under increased stress since losing his job.  He called in on 10/29/16 stating that he has been having chest pain over the past few days 8/10, usually lasts a few hours duration no nitroglycerin use. Did not go to the emergency department because it cost too much money. Hurts under right breast. Burping more. No help antiacid, Protonix. Hurst worse when flat. Hours at a time. Not colicky. Normal BM, no fevers, no melena. No long plane or car rides. No lower external swelling.  Wt Readings from Last 3 Encounters:  10/31/16 177 lb 1.9 oz (80.3 kg)  06/29/16 184 lb 6.4 oz (83.6 kg)  03/29/16 185 lb 6.4 oz (84.1 kg)     Past Medical History:  Diagnosis Date  . Cataracts, bilateral   . Chest pain   .  Chronic kidney disease    stage  3  . Coronary atherosclerosis of native coronary artery    DES to OM1 on 03/09/09, DES to PL branch on 03/22/09 by Dr. Leonia Reeves, nuclear stress test 2013 - low risk,  . Diarrhea, functional   . Epigastric abdominal pain   . Essential hypertension, benign   . GERD (gastroesophageal reflux disease)   . 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 Hypercholesteremia   . Hypercholesteremia   . Intermediate coronary syndrome ([place])   . Kidney stones   . Mixed hyperlipidemia   . Osteoarthritis    left leg,back  . Sleep apnea    no cpap, needed    Past Surgical History:  Procedure Laterality Date  . Bilateral thumb surgery Dr Burney Gauze    . CARDIAC CATHETERIZATION  2010   Staged PCI of the second vessel because of renal problems  . COLONOSCOPY    . COLONOSCOPY WITH PROPOFOL N/A 03/20/2016   Procedure: COLONOSCOPY WITH PROPOFOL;  Surgeon: Garlan Fair, MD;  Location: [facility];  Service: Endoscopy;  Laterality: N/A;  . LEFT HEART CATHETERIZATION WITH CORONARY ANGIOGRAM N/A 11/23/2013   Procedure: LEFT HEART CATHETERIZATION WITH CORONARY ANGIOGRAM;  Surgeon: Jettie Booze, MD;  Location: [facility] CATH LAB;  Service: Cardiovascular;  Laterality: N/A;  . SHOULDER ARTHROSCOPY  2012   Left Feb, Right Sept  . TOTAL HIP ARTHROPLASTY  Left 10/25/2015   Procedure: TOTAL HIP ARTHROPLASTY ANTERIOR APPROACH;  Surgeon: Melrose Nakayama, MD;  Location: [facility];  Service: Orthopedics;  Laterality: Left;  Marland Kitchen VASECTOMY      Current Outpatient Prescriptions  Medication Sig Dispense Refill  . aspirin 81 MG tablet Take 81 mg by mouth daily.    Marland Kitchen atorvastatin (LIPITOR) 40 MG tablet Take 1 tablet (40 mg total) by mouth daily. 90 tablet 0  . citalopram (CELEXA) 20 MG tablet Take 20 mg by mouth daily.     . clopidogrel (PLAVIX) 75 MG tablet Take 1 tablet (75 mg total) by mouth daily. 90 tablet 1  . gabapentin (NEURONTIN) 600 MG tablet Take 600 mg by mouth  daily.  2  . HYDROcodone-acetaminophen (NORCO) 10-325 MG tablet Take 1 tablet by mouth 2 (two) times daily as needed for moderate pain.    . isosorbide mononitrate (IMDUR) 30 MG 24 hr tablet Take 1 tablet (30 mg total) by mouth daily. 90 tablet 3  . loperamide (IMODIUM) 2 MG capsule Take 2 mg by mouth as needed for diarrhea or loose stools.    . nitroGLYCERIN (NITROSTAT) 0.4 MG SL tablet Place 1 tablet (0.4 mg total) under the tongue every 5 (five) minutes x 3 doses as needed for chest pain. 25 tablet 10  . pantoprazole (PROTONIX) 40 MG tablet Take 1 tablet (40 mg total) by mouth daily at 12 noon. 90 tablet 1  . Probiotic Product (TRUNATURE DIGESTIVE PROBIOTIC) CAPS Take 1 capsule by mouth daily.     No current facility-administered medications for this visit.     Allergies:    Allergies  Allergen Reactions  . Corticosteroids Swelling    Swelling of lips  . Prednisone Swelling    Swollen lips    Social History:  The patient  reports that he has never smoked. He has never used smokeless tobacco. He reports that he does not drink alcohol or use drugs.   ROS:  Please see the history of present illness.  No chest pain, no shortness of breath.    PHYSICAL EXAM: VS:  BP 130/70   Pulse 74   Ht 5\' 10"  (1.778 m)   Wt 177 lb 1.9 oz (80.3 kg)   BMI 25.41 kg/m  Well nourished, well developed, in no acute distress  HEENT: normal  Neck: no JVD  Cardiac:  normal S1, S2; RRR; no murmur  Lungs:  clear to auscultation bilaterally, no wheezing, rhonchi or rales  Abd: soft, nontender, no hepatomegaly -no tenderness along the right-sided rib cage. No abdominal tenderness in that region Ext: no edema  Skin: warm and dry  Neuro: no focal abnormalities noted  EKG: Today 10/31/16-sinus rhythm 74 with no other abnormalities, no ischemic changes personally viewed-prior 06/29/16-sinus rhythm, PACs. Personally viewed-prior 03/02/15-normal rhythm, 75, no other abnormalities.   Labs: 10/14-LDL 51, creatinine  1.3    ASSESSMENT AND PLAN:  1. Atypical right-sided chest pain- EKG reassuring with no ischemic changes. Pain sounds musculoskeletal. Low suspicion for pulmonary embolism. Does not sound colicky/gallbladder like. May be attributable to musculoskeletal strain from chopping wood. If symptoms worsen or become more worrisome, he knows to seek medical attention. Extensive may be blood work, x-ray. He is not tender over his rib cage. 2. Coronary artery disease- Plavix and continue isosorbide. Currently doing well. No symptoms.  No angina. 3. Hyperlipidemia-on atorvastatin 40. LDL goal less than 70. LDL 51. 4. Chronic kidney disease stage II-he is trying to avoid NSAIDs. May need other therapeutic options  for his arthritis. Getting epidural injections for his back. 5. Hypotension -much improved after stopping lisinopril. Previously was having dizziness, blood pressure running in the 90 range.  6. Sept month follow-up  Signed, Candee Furbish, MD [facility]  10/31/2016 10:07 AM

## 2016-11-06 ENCOUNTER — Other Ambulatory Visit: Payer: Self-pay | Admitting: Internal Medicine

## 2016-11-06 ENCOUNTER — Ambulatory Visit
Admission: RE | Admit: 2016-11-06 | Discharge: 2016-11-06 | Disposition: A | Discharge status: Home / Self Care | Payer: Medicare Other | Source: Ambulatory Visit | Attending: Internal Medicine | Admitting: Internal Medicine

## 2016-11-06 DIAGNOSIS — R0789 Other chest pain: Secondary | ICD-10-CM

## 2016-12-12 ENCOUNTER — Encounter (HOSPITAL_COMMUNITY): Payer: Self-pay | Admitting: Internal Medicine

## 2016-12-12 ENCOUNTER — Observation Stay (HOSPITAL_COMMUNITY): Payer: Medicare Other

## 2016-12-12 ENCOUNTER — Observation Stay (HOSPITAL_COMMUNITY)
Admission: EM | Admit: 2016-12-12 | Discharge: 2016-12-13 | Disposition: A | Discharge status: Home / Self Care | Payer: Medicare Other | Attending: Internal Medicine | Admitting: Internal Medicine

## 2016-12-12 DIAGNOSIS — Z823 Family history of stroke: Secondary | ICD-10-CM | POA: Insufficient documentation

## 2016-12-12 DIAGNOSIS — Z79899 Other long term (current) drug therapy: Secondary | ICD-10-CM | POA: Insufficient documentation

## 2016-12-12 DIAGNOSIS — Z9852 Vasectomy status: Secondary | ICD-10-CM | POA: Diagnosis not present

## 2016-12-12 DIAGNOSIS — Z7902 Long term (current) use of antithrombotics/antiplatelets: Secondary | ICD-10-CM | POA: Insufficient documentation

## 2016-12-12 DIAGNOSIS — M1612 Unilateral primary osteoarthritis, left hip: Secondary | ICD-10-CM | POA: Insufficient documentation

## 2016-12-12 DIAGNOSIS — G4733 Obstructive sleep apnea (adult) (pediatric): Secondary | ICD-10-CM | POA: Diagnosis not present

## 2016-12-12 DIAGNOSIS — Z955 Presence of coronary angioplasty implant and graft: Secondary | ICD-10-CM | POA: Diagnosis not present

## 2016-12-12 DIAGNOSIS — I25110 Atherosclerotic heart disease of native coronary artery with unstable angina pectoris: Secondary | ICD-10-CM | POA: Insufficient documentation

## 2016-12-12 DIAGNOSIS — I7774 Dissection of vertebral artery: Secondary | ICD-10-CM | POA: Diagnosis not present

## 2016-12-12 DIAGNOSIS — R072 Precordial pain: Secondary | ICD-10-CM

## 2016-12-12 DIAGNOSIS — R198 Other specified symptoms and signs involving the digestive system and abdomen: Secondary | ICD-10-CM

## 2016-12-12 DIAGNOSIS — N183 Chronic kidney disease, stage 3 (moderate): Secondary | ICD-10-CM | POA: Insufficient documentation

## 2016-12-12 DIAGNOSIS — E782 Mixed hyperlipidemia: Secondary | ICD-10-CM | POA: Diagnosis not present

## 2016-12-12 DIAGNOSIS — Z7982 Long term (current) use of aspirin: Secondary | ICD-10-CM | POA: Insufficient documentation

## 2016-12-12 DIAGNOSIS — I10 Essential (primary) hypertension: Secondary | ICD-10-CM

## 2016-12-12 DIAGNOSIS — I951 Orthostatic hypotension: Secondary | ICD-10-CM | POA: Diagnosis not present

## 2016-12-12 DIAGNOSIS — E7800 Pure hypercholesterolemia, unspecified: Secondary | ICD-10-CM | POA: Diagnosis not present

## 2016-12-12 DIAGNOSIS — Z888 Allergy status to other drugs, medicaments and biological substances status: Secondary | ICD-10-CM | POA: Insufficient documentation

## 2016-12-12 DIAGNOSIS — Z8673 Personal history of transient ischemic attack (TIA), and cerebral infarction without residual deficits: Secondary | ICD-10-CM | POA: Diagnosis not present

## 2016-12-12 DIAGNOSIS — I272 Pulmonary hypertension, unspecified: Secondary | ICD-10-CM | POA: Diagnosis not present

## 2016-12-12 DIAGNOSIS — I129 Hypertensive chronic kidney disease with stage 1 through stage 4 chronic kidney disease, or unspecified chronic kidney disease: Secondary | ICD-10-CM | POA: Insufficient documentation

## 2016-12-12 DIAGNOSIS — Z8249 Family history of ischemic heart disease and other diseases of the circulatory system: Secondary | ICD-10-CM | POA: Insufficient documentation

## 2016-12-12 DIAGNOSIS — Z820 Family history of epilepsy and other diseases of the nervous system: Secondary | ICD-10-CM | POA: Insufficient documentation

## 2016-12-12 DIAGNOSIS — G4730 Sleep apnea, unspecified: Secondary | ICD-10-CM | POA: Diagnosis not present

## 2016-12-12 DIAGNOSIS — J449 Chronic obstructive pulmonary disease, unspecified: Secondary | ICD-10-CM | POA: Insufficient documentation

## 2016-12-12 DIAGNOSIS — R55 Syncope and collapse: Principal | ICD-10-CM | POA: Diagnosis present

## 2016-12-12 DIAGNOSIS — Z96642 Presence of left artificial hip joint: Secondary | ICD-10-CM | POA: Insufficient documentation

## 2016-12-12 DIAGNOSIS — I081 Rheumatic disorders of both mitral and tricuspid valves: Secondary | ICD-10-CM | POA: Diagnosis not present

## 2016-12-12 DIAGNOSIS — Z87442 Personal history of urinary calculi: Secondary | ICD-10-CM | POA: Insufficient documentation

## 2016-12-12 DIAGNOSIS — E872 Acidosis: Secondary | ICD-10-CM | POA: Diagnosis not present

## 2016-12-12 DIAGNOSIS — N179 Acute kidney failure, unspecified: Secondary | ICD-10-CM | POA: Diagnosis present

## 2016-12-12 DIAGNOSIS — I2510 Atherosclerotic heart disease of native coronary artery without angina pectoris: Secondary | ICD-10-CM | POA: Diagnosis not present

## 2016-12-12 DIAGNOSIS — I25118 Atherosclerotic heart disease of native coronary artery with other forms of angina pectoris: Secondary | ICD-10-CM | POA: Diagnosis present

## 2016-12-12 DIAGNOSIS — Z825 Family history of asthma and other chronic lower respiratory diseases: Secondary | ICD-10-CM | POA: Insufficient documentation

## 2016-12-12 DIAGNOSIS — K219 Gastro-esophageal reflux disease without esophagitis: Secondary | ICD-10-CM | POA: Diagnosis present

## 2016-12-12 LAB — URINALYSIS, ROUTINE W REFLEX MICROSCOPIC
Bacteria, UA: NONE SEEN
Bilirubin Urine: NEGATIVE
GLUCOSE, UA: NEGATIVE mg/dL
HGB URINE DIPSTICK: NEGATIVE
Ketones, ur: NEGATIVE mg/dL
Leukocytes, UA: NEGATIVE
NITRITE: NEGATIVE
Protein, ur: NEGATIVE mg/dL
SPECIFIC GRAVITY, URINE: 1.026 (ref 1.005–1.030)
Squamous Epithelial / LPF: NONE SEEN
pH: 5 (ref 5.0–8.0)

## 2016-12-12 LAB — CBC
HCT: 42.6 % (ref 39.0–52.0)
Hemoglobin: 13.8 g/dL (ref 13.0–17.0)
MCH: 28.5 pg (ref 26.0–34.0)
MCHC: 32.4 g/dL (ref 30.0–36.0)
MCV: 88 fL (ref 78.0–100.0)
PLATELETS: 214 10*3/uL (ref 150–400)
RBC: 4.84 MIL/uL (ref 4.22–5.81)
RDW: 12.8 % (ref 11.5–15.5)
WBC: 6.7 10*3/uL (ref 4.0–10.5)

## 2016-12-12 LAB — LACTIC ACID, PLASMA: Lactic Acid, Venous: 2 mmol/L (ref 0.5–1.9)

## 2016-12-12 LAB — BASIC METABOLIC PANEL
ANION GAP: 10 (ref 5–15)
BUN: 14 mg/dL (ref 6–20)
CALCIUM: 8.8 mg/dL — AB (ref 8.9–10.3)
CO2: 20 mmol/L — ABNORMAL LOW (ref 22–32)
Chloride: 108 mmol/L (ref 101–111)
Creatinine, Ser: 1.4 mg/dL — ABNORMAL HIGH (ref 0.61–1.24)
GFR calc Af Amer: 59 mL/min — ABNORMAL LOW (ref >60)
GFR, EST NON AFRICAN AMERICAN: 50 mL/min — AB (ref >60)
Glucose, Bld: 133 mg/dL — ABNORMAL HIGH (ref 65–99)
Potassium: 4.2 mmol/L (ref 3.5–5.1)
Sodium: 138 mmol/L (ref 135–145)

## 2016-12-12 LAB — RAPID URINE DRUG SCREEN, HOSP PERFORMED
Amphetamines: NOT DETECTED
Barbiturates: NOT DETECTED
Benzodiazepines: NOT DETECTED
Cocaine: NOT DETECTED
OPIATES: POSITIVE — AB
Tetrahydrocannabinol: NOT DETECTED

## 2016-12-12 LAB — I-STAT VENOUS BLOOD GAS, ED
ACID-BASE EXCESS: 1 mmol/L (ref 0.0–2.0)
Bicarbonate: 26.5 mmol/L (ref 20.0–28.0)
O2 SAT: 46 %
TCO2: 28 mmol/L (ref 0–100)
pCO2, Ven: 46 mmHg (ref 44.0–60.0)
pH, Ven: 7.368 (ref 7.250–7.430)
pO2, Ven: 26 mmHg — CL (ref 32.0–45.0)

## 2016-12-12 LAB — TROPONIN I: Troponin I: <0.03 ng/mL (ref <0.03)

## 2016-12-12 LAB — SODIUM, URINE, RANDOM: SODIUM UR: 134 mmol/L

## 2016-12-12 LAB — I-STAT TROPONIN, ED: TROPONIN I, POC: 0 ng/mL (ref 0.00–0.08)

## 2016-12-12 LAB — CREATININE, URINE, RANDOM: CREATININE, URINE: 240.8 mg/dL

## 2016-12-12 LAB — D-DIMER, QUANTITATIVE (NOT AT ARMC): D DIMER QUANT: 0.45 ug{FEU}/mL (ref 0.00–0.50)

## 2016-12-12 MED ORDER — CLOPIDOGREL BISULFATE 75 MG PO TABS
75.0000 mg | ORAL_TABLET | Freq: Once | ORAL | Status: AC
  Administered 2016-12-12: 75 mg via ORAL
  Filled 2016-12-12: qty 1

## 2016-12-12 MED ORDER — SODIUM CHLORIDE 0.9 % IV SOLN
INTRAVENOUS | Status: DC
Start: 2016-12-12 — End: 2016-12-13
  Administered 2016-12-12: 23:00:00 via INTRAVENOUS
  Administered 2016-12-13: 1000 mL via INTRAVENOUS

## 2016-12-12 NOTE — ED Notes (Signed)
Pt made aware of bed assignment 

## 2016-12-12 NOTE — ED Provider Notes (Signed)
[facility] DEPT Provider Note   CSN: [id number] Arrival date & time: 12/12/16  1652     History   Chief Complaint Chief Complaint  Patient presents with  . Near Syncope    HPI Tom Goodman is a 66 y.o. male.  HPI  66 y.o. male with medical history significant of CAD, HTN, HLD, CKD, OSA  Comes in to the ER with cc of loss of consciousness. Pt was in his car driving, when suddently he started feeling unwell and started sweating. Pt pulled over - and then had a fainting spell. He didn't feel like he was going to faint, until he actually did. Pt's wide called EMS, and upon EMS arrival pt was noted to be pale and diaphoretic and had some nausea. Pt reports having some substernal chest pain and L shoulder pain off and on for the last several months. Currently he has mild discomfort in the chest. Pain is not radiating, and it is not sharp or pressure like. Pt does indicate however, that he had very vague and similar symptoms with his ACS. Pt is not on any new meds. No emesis, diarrhea. No dib. Pt has no hx of PE, DVT and denies any exogenous hormone (testosterone / estrogen) use, long distance travels or surgery in the past 6 weeks, active cancer, recent immobilization.  Past Medical History:  Diagnosis Date  . Cataracts, bilateral   . Chest pain   . Chronic kidney disease    stage  3  . Coronary atherosclerosis of native coronary artery    DES to OM1 on 03/09/09, DES to PL branch on 03/22/09 by Dr. Leonia Reeves, nuclear stress test 2013 - low risk,  . Diarrhea, functional   . Epigastric abdominal pain   . Essential hypertension, benign   . GERD (gastroesophageal reflux disease)   . 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 Hypercholesteremia   . Hypercholesteremia   . Intermediate coronary syndrome ([place])   . Kidney stones   . Mixed hyperlipidemia   . Osteoarthritis    left leg,back  . Sleep apnea    no cpap, needed    Patient Active Problem List   Diagnosis Date Noted  . Syncope and collapse 12/12/2016  . Primary osteoarthritis of left hip 10/25/2015  . Chest pressure 04/10/2015  . Unstable angina ([place]) 11/22/2013  . Hypercholesteremia   . GERD (gastroesophageal reflux disease)   . Osteoarthritis   . Chest pain   . Intermediate coronary syndrome ([place]) 09/20/2011  . Coronary atherosclerosis of native coronary artery 09/20/2011  . Essential hypertension, benign 09/20/2011  . Mixed hyperlipidemia 09/20/2011  . Sleep apnea 09/20/2011  . Epigastric abdominal pain 09/20/2011    Past Surgical History:  Procedure Laterality Date  . Bilateral thumb surgery Dr Burney Gauze    . CARDIAC CATHETERIZATION  2010   Staged PCI of the second vessel because of renal problems  . COLONOSCOPY    . COLONOSCOPY WITH PROPOFOL N/A 03/20/2016   Procedure: COLONOSCOPY WITH PROPOFOL;  Surgeon: Garlan Fair, MD;  Location: [facility];  Service: Endoscopy;  Laterality: N/A;  . LEFT HEART CATHETERIZATION WITH CORONARY ANGIOGRAM N/A 11/23/2013   Procedure: LEFT HEART CATHETERIZATION WITH CORONARY ANGIOGRAM;  Surgeon: Jettie Booze, MD;  Location: [facility] CATH LAB;  Service: Cardiovascular;  Laterality: N/A;  . SHOULDER ARTHROSCOPY  2012   Left Feb, Right Sept  . TOTAL HIP ARTHROPLASTY Left 10/25/2015   Procedure: TOTAL HIP ARTHROPLASTY ANTERIOR APPROACH;  Surgeon: Melrose Nakayama, MD;  Location: [facility];  Service: Orthopedics;  Laterality: Left;  Marland Kitchen VASECTOMY         Home Medications    Prior to Admission medications   Medication Sig Start Date End Date Taking? Authorizing Provider  aspirin 81 MG tablet Take 81 mg by mouth daily.   Yes Historical Provider, MD  atorvastatin (LIPITOR) 40 MG tablet Take 1 tablet (40 mg total) by mouth daily. 04/26/16  Yes Jerline Pain, MD  citalopram (CELEXA) 20 MG tablet Take 20 mg by mouth daily.  10/27/13  Yes Historical Provider, MD  clopidogrel (PLAVIX) 75 MG tablet Take 1 tablet (75 mg total) by mouth daily. 10/18/15   Yes Jerline Pain, MD  gabapentin (NEURONTIN) 300 MG capsule Take 600 mg by mouth at bedtime.  02/18/16  Yes Historical Provider, MD  HYDROcodone-acetaminophen (NORCO) 10-325 MG tablet Take 1 tablet by mouth 2 (two) times daily as needed for moderate pain.   Yes Historical Provider, MD  isosorbide mononitrate (IMDUR) 30 MG 24 hr tablet Take 1 tablet (30 mg total) by mouth daily. 09/16/14  Yes Jerline Pain, MD  loperamide (IMODIUM) 2 MG capsule Take 2 mg by mouth as needed for diarrhea or loose stools.   Yes Historical Provider, MD  pantoprazole (PROTONIX) 40 MG tablet Take 1 tablet (40 mg total) by mouth daily at 12 noon. 10/27/14 01/20/17 Yes Jerline Pain, MD  Probiotic Product (TRUNATURE DIGESTIVE PROBIOTIC) CAPS Take 1 capsule by mouth daily.   Yes Historical Provider, MD  nitroGLYCERIN (NITROSTAT) 0.4 MG SL tablet Place 1 tablet (0.4 mg total) under the tongue every 5 (five) minutes x 3 doses as needed for chest pain. 04/14/15   Jerline Pain, MD    Family History Family History  Problem Relation Age of Onset  . Dementia Mother   . Stroke Mother   . Emphysema Father   . Hypertension Brother   . CVA Sister   . Stroke Sister   . Heart attack Neg Hx     Social History Social History  Substance Use Topics  . Smoking status: Never Smoker  . Smokeless tobacco: Never Used  . Alcohol use No     Allergies   Corticosteroids and Prednisone   Review of Systems Review of Systems  ROS 10 Systems reviewed and are negative for acute change except as noted in the HPI.     Physical Exam Updated Vital Signs BP 147/79   Pulse 76   Temp 97.4 F (36.3 C) (Oral)   Resp 17   SpO2 99%   Physical Exam  Constitutional: He is oriented to person, place, and time. He appears well-developed.  HENT:  Head: Normocephalic and atraumatic.  Eyes: Conjunctivae and EOM are normal. Pupils are equal, round, and reactive to light.  Neck: Normal range of motion. Neck supple. No JVD present.    Cardiovascular: Normal rate, regular rhythm, normal heart sounds and intact distal pulses.   Pulmonary/Chest: Effort normal and breath sounds normal.  Abdominal: Soft. Bowel sounds are normal. He exhibits no distension and no mass. There is no tenderness. There is no rebound and no guarding.  Musculoskeletal: He exhibits no edema, tenderness or deformity.  Neurological: He is alert and oriented to person, place, and time.  Skin: Skin is warm. Capillary refill takes less than 2 seconds.  Nursing note and vitals reviewed.    ED Treatments / Results  Labs (all labs ordered are listed, but only abnormal results  are displayed) Labs Reviewed  BASIC METABOLIC PANEL - Abnormal; Notable for the following:       Result Value   CO2 20 (*)    Glucose, Bld 133 (*)    Creatinine, Ser 1.40 (*)    Calcium 8.8 (*)    GFR calc non Af Amer 50 (*)    GFR calc Af Amer 59 (*)    All other components within normal limits  I-STAT VENOUS BLOOD GAS, ED - Abnormal; Notable for the following:    pO2, Ven 26.0 (*)    All other components within normal limits  CBC  URINALYSIS, ROUTINE W REFLEX MICROSCOPIC  RAPID URINE DRUG SCREEN, HOSP PERFORMED  TROPONIN I  TROPONIN I  TROPONIN I  D-DIMER, QUANTITATIVE (NOT AT [facility])  LACTIC ACID, PLASMA  CBG MONITORING, ED  I-STAT TROPOININ, ED    EKG  EKG Interpretation  Date/Time:  Wednesday December 12 2016 16:55:49 EDT Ventricular Rate:  59 PR Interval:    QRS Duration: 101 QT Interval:  464 QTC Calculation: 460 R Axis:   76 Text Interpretation:  Sinus rhythm normal intervals No acute changes No significant change since last tracing Confirmed by Kathrynn Humble, MD, Thelma Comp [phone]) on 12/12/2016 5:08:51 PM       Radiology Dg Chest 2 View  Result Date: 12/12/2016 CLINICAL DATA:  Driving and became hot complaining of epigastric pain EXAM: CHEST  2 VIEW COMPARISON:  11/06/2016 FINDINGS: Right lung is grossly clear. There is mild atelectasis or scar at the left base.  Heart size is stable. No pneumothorax. IMPRESSION: Minimal atelectasis left lung base. Otherwise no radiographic evidence for acute cardiopulmonary abnormality. Electronically Signed   By: Donavan Foil M.D.   On: 12/12/2016 20:59   Dg Abd 1 View  Result Date: 12/12/2016 CLINICAL DATA:  Epigastric pain EXAM: ABDOMEN - 1 VIEW COMPARISON:  None. FINDINGS: Lung bases are not included. Nonobstructed bowel-gas pattern. No abnormal calcifications. Status post left hip replacement. IMPRESSION: Nonobstructed bowel-gas pattern Electronically Signed   By: Donavan Foil M.D.   On: 12/12/2016 20:59    Procedures Procedures (including critical care time)  PREVIOUS CATH: Angiographic Findings:  Left main: No obstructive disease.   Left Anterior Descending Artery: Large caliber vessel that courses to the apex. The proximal vessel has diffuse 20% stenosis. The mid vessel has diffuse 30% stenosis. The distal vessel has no obstructive disease. There are two small caliber diagonal branches with 50% ostial stenosis, unchanged from previous cath. There is a large septal perforating branch with ostial 99% stenosis, unchanged from last cath.   Circumflex Artery: Moderate caliber vessel with small caliber intermediate branch, small caliber first OM branch. The small caliber intermediate branch has proximal 60% stenosis. (This is a 1.75 mm vessel). The small caliber OM branch has a proximal stent with 100% occlusion at the proximal edge of the stent. This OM branch is seen to fill from right to left collaterals.   Right Coronary Artery: Large dominant vessel with proximal luminal irregularities, mid 50-60% smooth stenosis and distal luminal irregularities. The posterolateral branch has a patent stent with no restenosis. The PDA is a small caliber vessel with no obstructive disease.   Left Ventricular Angiogram: LVEF=65%  Impression: 1. Double vessel CAD  2. Patent stent in the posterolateral branch 3.  Occluded stent in OM1 with collateral filling from right to left collaterals 4. Moderate mid RCA stenosis 5. Normal LV systolic function  Medications Ordered in ED Medications  clopidogrel (PLAVIX) tablet 75 mg (75  mg Oral Given 12/12/16 1917)     Initial Impression / Assessment and Plan / ED Course  I have reviewed the triage vital signs and the nursing notes.  Pertinent labs & imaging results that were available during my care of the patient were reviewed by me and considered in my medical decision making (see chart for details).     Pt comes in after he had syncope and with some atypica chest pain and L shoulder pain. Pt has hx of CAD.   DDx includes: Orthostatic hypotension Stroke Vertebral artery dissection/stenosis Dysrhythmia PE Vasovagal/neurocardiogenic syncope Aortic stenosis Valvular disorder/Cardiomyopathy Anemia Posterior infarct  Pt's neuro exam is normal.  Pt has hx of ACS with vague symptoms - we got a posterior ekg and it is normal. Even during our exam, pt feels sleepy and weak - which is why Its hard to call the episode squarely vasovagal. Additionally, pt reports similar, but more intense symptoms with his ACS. Cath report reviewed, and pt had moderately severe clot burden, including RCA involvement.  For now plan is to admit to medicine for syncope and get ACS r/o. Cardiology has been requested to see the patient during the admission, and Dr. Gwenlyn Found acknowledged participating in care.     Final Clinical Impressions(s) / ED Diagnoses   Final diagnoses:  Syncope and collapse  Precordial chest pain    New Prescriptions New Prescriptions   No medications on file     Varney Biles, MD 12/12/16 2126

## 2016-12-12 NOTE — H&P (Signed)
NOCHOLAS Goodman OFB:[id number] DOB: 1951-07-21 DOA: 12/12/2016     PCP: Kandice Hams, MD   Outpatient Specialists: [facility] Patient coming from:    home Lives  With family    Chief Complaint: chest pain, near syncope  HPI: Tom Goodman is a 66 y.o. male with medical history significant of CAD, HTN, HLD, CKD, OSA    Presented with   syncope Patient was driving reports feeling hot and sweaty he pulled over and syncopsized and was unresponsive for approximately 45 seconds, no seizure activity on EMS arrival he was pale and diaphoretic and had some nausea, While in the ambulance had another episode and nausea. NO  chest pain ON the way to the ER he had some epigastric abdominal pain now resolved.  CBG was 100  Reports intermittent chest pains in the past but have not been evaluated in emergency department as he thought it cost too much. He did take a hydrocodone earlier on today usually tolerates well.  He has not had enough to eat or drink today. Denies feeling light headed.  Had prior episode like this and was diagnosed with CAD. Reports compliance with medications. NO shortness of breath.  NO leg swelling no fever, no recent travel Last month he's been having intermittent right upper quadrant pain chest x-ray showed no evidence of rib fracture he was seen by cardiology and was told it was non cardiac curretnly pain has resolved Regarding pertinent Chronic problems: Regarding history of CAD followed by cardiology DES to obtuse marginal one on 03/09/09 (occluded), DES to posterior lateral branch on 03/22/09 by Dr. Leonia Reeves, nuclear stress test 2013 which was low risk, in 2015  He underwent cardiac catheterization which showed occluded obtuse marginal stent, 50% RCA, patent DES in the posterior lateral branch. Collateral blood flow was noted to obtuse marginal. Normal EF. He was started on isosorbide.    IN ER:  Temp (24hrs), Avg:97.4 F (36.3 C), Min:97.4 F (36.3 C), Max:97.4 F  (36.3 C)  On arrival he was difficult to arouse but now resolved RR 14 98% HR 60 BP 122/67  Trop 0.00 K 4.2 Cr 1.4 up form baseline 1.29 WBC 6.7 Hg 13.8  Following Medications were ordered in ER: Medications  clopidogrel (PLAVIX) tablet 75 mg (not administered)     ER provider discussed case with:  Cardiology  Hospitalist was called for admission for syncope evaluation  Review of Systems:    Pertinent positives include: abdominal pain  Constitutional:  No weight loss, night sweats, Fevers, chills, fatigue, weight loss  HEENT:  No headaches, Difficulty swallowing,Tooth/dental problems,Sore throat,  No sneezing, itching, ear ache, nasal congestion, post nasal drip,  Cardio-vascular:  No chest pain, Orthopnea, PND, anasarca, dizziness, palpitations.no Bilateral lower extremity swelling  GI:  No heartburn, indigestion, abdominal pain, nausea, vomiting, diarrhea, change in bowel habits, loss of appetite, melena, blood in stool, hematemesis Resp:  no shortness of breath at rest. No dyspnea on exertion, No excess mucus, no productive cough, No non-productive cough, No coughing up of blood.No change in color of mucus.No wheezing. Skin:  no rash or lesions. No jaundice GU:  no dysuria, change in color of urine, no urgency or frequency. No straining to urinate.  No flank pain.  Musculoskeletal:  No joint pain or no joint swelling. No decreased range of motion. No back pain.  Psych:  No change in mood or affect. No depression or anxiety. No memory loss.  Neuro: no localizing neurological complaints, no tingling, no  weakness, no double vision, no gait abnormality, no slurred speech, no confusion  As per HPI otherwise 10 point review of systems negative.   Past Medical History: Past Medical History:  Diagnosis Date  . Cataracts, bilateral   . Chest pain   . Chronic kidney disease    stage  3  . Coronary atherosclerosis of native coronary artery    DES to OM1 on 03/09/09, DES  to PL branch on 03/22/09 by Dr. Leonia Reeves, nuclear stress test 2013 - low risk,  . Diarrhea, functional   . Epigastric abdominal pain   . Essential hypertension, benign   . GERD (gastroesophageal reflux disease)   . 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 Hypercholesteremia   . Hypercholesteremia   . Intermediate coronary syndrome ([place])   . Kidney stones   . Mixed hyperlipidemia   . Osteoarthritis    left leg,back  . Sleep apnea    no cpap, needed   Past Surgical History:  Procedure Laterality Date  . Bilateral thumb surgery Dr Burney Gauze    . CARDIAC CATHETERIZATION  2010   Staged PCI of the second vessel because of renal problems  . COLONOSCOPY    . COLONOSCOPY WITH PROPOFOL N/A 03/20/2016   Procedure: COLONOSCOPY WITH PROPOFOL;  Surgeon: Garlan Fair, MD;  Location: [facility];  Service: Endoscopy;  Laterality: N/A;  . LEFT HEART CATHETERIZATION WITH CORONARY ANGIOGRAM N/A 11/23/2013   Procedure: LEFT HEART CATHETERIZATION WITH CORONARY ANGIOGRAM;  Surgeon: Jettie Booze, MD;  Location[facility] LAB;  Service: Cardiovascular;  Laterality: N/A;  . SHOULDER ARTHROSCOPY  2012   Left Feb, Right Sept  . TOTAL HIP ARTHROPLASTY Left 10/25/2015   Procedure: TOTAL HIP ARTHROPLASTY ANTERIOR APPROACH;  Surgeon: Melrose Nakayama, MD;  Location: [facility];  Service: Orthopedics;  Laterality: Left;  Marland Kitchen VASECTOMY       Social History:  Ambulatory  independently     reports that he has never smoked. He has never used smokeless tobacco. He reports that he does not drink alcohol or use drugs.  Allergies:   Allergies  Allergen Reactions  . Corticosteroids Swelling and Other (See Comments)    Swelling of lips  . Prednisone Swelling and Other (See Comments)    Swollen lips       Family History:  Family History  Problem Relation Age of Onset  . Emphysema Father   . Dementia Mother   . Hypertension Brother   . CVA Sister   . Heart attack Neg Hx   .  Stroke Mother   . Stroke Sister     Medications: Prior to Admission medications   Medication Sig Start Date End Date Taking? Authorizing Provider  aspirin 81 MG tablet Take 81 mg by mouth daily.   Yes Historical Provider, MD  atorvastatin (LIPITOR) 40 MG tablet Take 1 tablet (40 mg total) by mouth daily. 04/26/16  Yes Jerline Pain, MD  citalopram (CELEXA) 20 MG tablet Take 20 mg by mouth daily.  10/27/13  Yes Historical Provider, MD  clopidogrel (PLAVIX) 75 MG tablet Take 1 tablet (75 mg total) by mouth daily. 10/18/15  Yes Jerline Pain, MD  gabapentin (NEURONTIN) 300 MG capsule Take 600 mg by mouth at bedtime.  02/18/16  Yes Historical Provider, MD  HYDROcodone-acetaminophen (NORCO) 10-325 MG tablet Take 1 tablet by mouth 2 (two) times daily as needed for moderate pain.   Yes Historical Provider, MD  isosorbide mononitrate (IMDUR)  30 MG 24 hr tablet Take 1 tablet (30 mg total) by mouth daily. 09/16/14  Yes Jerline Pain, MD  loperamide (IMODIUM) 2 MG capsule Take 2 mg by mouth as needed for diarrhea or loose stools.   Yes Historical Provider, MD  pantoprazole (PROTONIX) 40 MG tablet Take 1 tablet (40 mg total) by mouth daily at 12 noon. 10/27/14 01/20/17 Yes Jerline Pain, MD  Probiotic Product (TRUNATURE DIGESTIVE PROBIOTIC) CAPS Take 1 capsule by mouth daily.   Yes Historical Provider, MD  nitroGLYCERIN (NITROSTAT) 0.4 MG SL tablet Place 1 tablet (0.4 mg total) under the tongue every 5 (five) minutes x 3 doses as needed for chest pain. 04/14/15   Jerline Pain, MD    Physical Exam: Patient Vitals for the past 24 hrs:  BP Temp Temp src Pulse Resp SpO2  12/12/16 1800 122/61 - - 60 10 98 %  12/12/16 1745 127/67 - - (!) 58 14 98 %  12/12/16 1730 132/67 - - (!) 59 16 99 %  12/12/16 1728 - - - (!) 59 15 100 %  12/12/16 1727 - - - 60 13 100 %  12/12/16 1656 135/61 97.4 F (36.3 C) Oral (!) 57 20 100 %    1. General:  in No Acute distress 2. Psychological: Alert  Oriented 3. Head/ENT:   Moist    Mucous Membranes                          Head Non traumatic, neck supple                          Normal  Dentition 4. SKIN:  decreased Skin turgor,  Skin clean Dry and intact no rash 5. Heart: Regular rate and rhythm no  Murmur, Rub or gallop 6. Lungs:  Clear to auscultation bilaterally, no wheezes or crackles   7. Abdomen: Soft, non-tender, Non distended Right lower quadrant fullness noted 8. Lower extremities: no clubbing, cyanosis, or edema 9. Neurologically Grossly intact, moving all 4 extremities equally   10. MSK: Normal range of motion   body mass index is unknown because there is no height or weight on file.  Labs on Admission:   Labs on Admission: I have personally reviewed following labs and imaging studies  CBC:  Recent Labs Lab 12/12/16 1709  WBC 6.7  HGB 13.8  HCT 42.6  MCV 88.0  PLT 132   Basic Metabolic Panel:  Recent Labs Lab 12/12/16 1709  NA 138  K 4.2  CL 108  CO2 20*  GLUCOSE 133*  BUN 14  CREATININE 1.40*  CALCIUM 8.8*   GFR: CrCl cannot be calculated (Unknown ideal weight.). Liver Function Tests: No results for input(s): AST, ALT, ALKPHOS, BILITOT, PROT, ALBUMIN in the last 168 hours. No results for input(s): LIPASE, AMYLASE in the last 168 hours. No results for input(s): AMMONIA in the last 168 hours. Coagulation Profile: No results for input(s): INR, PROTIME in the last 168 hours. Cardiac Enzymes: No results for input(s): CKTOTAL, CKMB, CKMBINDEX, TROPONINI in the last 168 hours. BNP (last 3 results) No results for input(s): PROBNP in the last 8760 hours. HbA1C: No results for input(s): HGBA1C in the last 72 hours. CBG: No results for input(s): GLUCAP in the last 168 hours. Lipid Profile: No results for input(s): CHOL, HDL, LDLCALC, TRIG, CHOLHDL, LDLDIRECT in the last 72 hours. Thyroid Function Tests: No results for input(s): TSH, T4TOTAL,  FREET4, T3FREE, THYROIDAB in the last 72 hours. Anemia Panel: No results for input(s):  VITAMINB12, FOLATE, FERRITIN, TIBC, IRON, RETICCTPCT in the last 72 hours. Urine analysis:  Sepsis Labs: @LABRCNTIP (procalcitonin:4,lacticidven:4) )No results found for this or any previous visit (from the past 240 hour(s)).     UA  ordered  Lab Results  Component Value Date   HGBA1C 5.6 11/22/2013    CrCl cannot be calculated (Unknown ideal weight.).  BNP (last 3 results) No results for input(s): PROBNP in the last 8760 hours.   ECG REPORT  Independently reviewed Rate: 59  Rhythm: NSR ST&T Change: No acute ischemic changes  QTC 460  There were no vitals filed for this visit.   Cultures:    Component Value Date/Time   SDES URINE, RANDOM 03/10/2009 1102   SPECREQUEST NONE 03/10/2009 1102   CULT INSIGNIFICANT GROWTH 03/10/2009 1102   REPTSTATUS 03/11/2009 FINAL 03/10/2009 1102     Radiological Exams on Admission: No results found.  Chart has been reviewed    Assessment/Plan   66 y.o. male with medical history significant of CAD, HTN, HLD, CKD, OSA not on CPAP presents with syncopal event    Present on Admission: . Syncope and collapse - patient had associated prodrome and had had decreased by mouth intake prior to this. But in the past had similar presentation and at that time was diagnosis with CAD. Given risk factors will admit rehydrate obtain CE, ECHO monitor on tele and obtain carotid dopplers, Cardiology consult in AM Obtain CXR, d.dimer post syncope had some associated lethargy now resolved.  . Coronary atherosclerosis of native coronary artery - continue Plavix and IMDUR aspirin . Essential hypertension, benign - stable continue home medications . GERD (gastroesophageal reflux disease) -continue PROTONIX . Hypercholesteremia - stable check cholesterol panel continue lipitor . Sleep apnea no on CPAP continue to follow   Other plan as per orders.  DVT prophylaxis:    Lovenox     Code Status:  FULL CODE  care as per patient   Family Communication:    Family   at  Bedside  plan of care was discussed with   Son, Daughter, Wife,    Disposition Plan:      To home once workup is complete and patient is stable                              Consults called: email cardiology    Admission status:   obs   Level of care    tele          I have spent a total of 56 min on this admission    Dafne Nield 12/12/2016, 8:44 PM   [facility]  Pager [phone]   after 2 AM please page floor coverage PA If 7AM-7PM, please contact the day team taking care of the patient  [email or web address]  Password [email or web address]

## 2016-12-12 NOTE — ED Notes (Signed)
Asked pt for urine specimen. Pt unable to go at this time. Pt stated he will give sample soon as he can.

## 2016-12-12 NOTE — Progress Notes (Addendum)
CRITICAL VALUE ALERT  Critical value received:  Lactic Acid 2.0  Date of notification:  12/12/2016  Time of notification:  2340  Critical value read back: Yes  Nurse who received alert:  Hermina Barters  MD notified (1st page):  Forrest Moron NP  Time of first page:  2350

## 2016-12-12 NOTE — ED Notes (Signed)
Patient aware of need for urine. 
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
## 2016-12-12 NOTE — ED Notes (Signed)
Second EKG done per Dr. Kathrynn Humble, posterior view.

## 2016-12-12 NOTE — ED Notes (Signed)
Attempted report x1. 

## 2016-12-12 NOTE — ED Triage Notes (Signed)
Pt to ED via [facility] for eval after having a syncope episode. Per EMS pt was unresponsive for approx 45 sec. On EMS arrival pt was pale, diaphoretic, and nauseated. Now complaining of pain around umbilical area along with cp. Pt dusky in color on arrival. cbg 100. 18g IV started pta

## 2016-12-12 NOTE — ED Notes (Signed)
Patient provided with sprite.  Okay;'d by MD.  Family to get food for pt.

## 2016-12-12 NOTE — ED Notes (Signed)
Delay in lab draw,,pt not in room.

## 2016-12-13 ENCOUNTER — Observation Stay (HOSPITAL_BASED_OUTPATIENT_CLINIC_OR_DEPARTMENT_OTHER): Payer: Medicare Other

## 2016-12-13 ENCOUNTER — Encounter (HOSPITAL_COMMUNITY)
Admission: EM | Disposition: A | Discharge status: Home / Self Care | Payer: Self-pay | Source: Home / Self Care | Attending: Emergency Medicine

## 2016-12-13 DIAGNOSIS — Z95818 Presence of other cardiac implants and grafts: Secondary | ICD-10-CM

## 2016-12-13 DIAGNOSIS — K219 Gastro-esophageal reflux disease without esophagitis: Secondary | ICD-10-CM | POA: Diagnosis not present

## 2016-12-13 DIAGNOSIS — I2510 Atherosclerotic heart disease of native coronary artery without angina pectoris: Secondary | ICD-10-CM

## 2016-12-13 DIAGNOSIS — N179 Acute kidney failure, unspecified: Secondary | ICD-10-CM | POA: Diagnosis not present

## 2016-12-13 DIAGNOSIS — R072 Precordial pain: Secondary | ICD-10-CM

## 2016-12-13 DIAGNOSIS — R55 Syncope and collapse: Secondary | ICD-10-CM | POA: Diagnosis not present

## 2016-12-13 DIAGNOSIS — E7800 Pure hypercholesterolemia, unspecified: Secondary | ICD-10-CM | POA: Diagnosis not present

## 2016-12-13 HISTORY — PX: LOOP RECORDER INSERTION: EP1214

## 2016-12-13 HISTORY — DX: Presence of other cardiac implants and grafts: Z95.818

## 2016-12-13 LAB — ECHOCARDIOGRAM COMPLETE
Height: 70 in
Weight: 2812.8 oz

## 2016-12-13 LAB — TROPONIN I

## 2016-12-13 LAB — BASIC METABOLIC PANEL
Anion gap: 5 (ref 5–15)
BUN: 13 mg/dL (ref 6–20)
CALCIUM: 8.4 mg/dL — AB (ref 8.9–10.3)
CO2: 24 mmol/L (ref 22–32)
CREATININE: 1.22 mg/dL (ref 0.61–1.24)
Chloride: 109 mmol/L (ref 101–111)
GFR, EST NON AFRICAN AMERICAN: 60 mL/min — AB (ref >60)
GLUCOSE: 95 mg/dL (ref 65–99)
Potassium: 3.9 mmol/L (ref 3.5–5.1)
Sodium: 138 mmol/L (ref 135–145)

## 2016-12-13 SURGERY — LOOP RECORDER INSERTION

## 2016-12-13 MED ORDER — LIDOCAINE HCL (PF) 1 % IJ SOLN
INTRAMUSCULAR | Status: AC
  Filled 2016-12-13: qty 30

## 2016-12-13 MED ORDER — GABAPENTIN 300 MG PO CAPS: 600.0000 mg | ORAL_CAPSULE | Freq: Every day | ORAL | Status: DC

## 2016-12-13 MED ORDER — ISOSORBIDE MONONITRATE ER 30 MG PO TB24
30.0000 mg | ORAL_TABLET | Freq: Every day | ORAL | Status: DC
  Administered 2016-12-13: 30 mg via ORAL
  Filled 2016-12-13: qty 1

## 2016-12-13 MED ORDER — PANTOPRAZOLE SODIUM 40 MG PO TBEC
40.0000 mg | DELAYED_RELEASE_TABLET | Freq: Every day | ORAL | Status: DC
  Administered 2016-12-13: 40 mg via ORAL
  Filled 2016-12-13: qty 1

## 2016-12-13 MED ORDER — CLOPIDOGREL BISULFATE 75 MG PO TABS
75.0000 mg | ORAL_TABLET | Freq: Every day | ORAL | Status: DC
  Administered 2016-12-13: 75 mg via ORAL
  Filled 2016-12-13: qty 1

## 2016-12-13 MED ORDER — ASPIRIN 81 MG PO CHEW
81.0000 mg | CHEWABLE_TABLET | Freq: Every day | ORAL | Status: DC
  Administered 2016-12-13: 81 mg via ORAL
  Filled 2016-12-13: qty 1

## 2016-12-13 MED ORDER — ATORVASTATIN CALCIUM 40 MG PO TABS
40.0000 mg | ORAL_TABLET | Freq: Every day | ORAL | Status: DC
  Administered 2016-12-13: 40 mg via ORAL
  Filled 2016-12-13: qty 1

## 2016-12-13 MED ORDER — LIDOCAINE HCL (PF) 1 % IJ SOLN
INTRAMUSCULAR | Status: DC | PRN
  Administered 2016-12-13: 15 mL via INTRADERMAL

## 2016-12-13 MED ORDER — ENSURE ENLIVE PO LIQD
237.0000 mL | Freq: Two times a day (BID) | ORAL | Status: DC
  Administered 2016-12-13: 237 mL via ORAL

## 2016-12-13 SURGICAL SUPPLY — 2 items
LOOP REVEAL LINQSYS (Prosthesis & Implant Heart) ×2 IMPLANT
PACK LOOP INSERTION (CUSTOM PROCEDURE TRAY) ×2 IMPLANT
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
## 2016-12-13 NOTE — Progress Notes (Signed)
Pt requesting  to go home. States "The doctors say I can go home after I finished with my test."  Now watching video on loop recorder with assistance from cath lab nurse Dannial Monarch.  Notified Dr. Tana Coast. Karie Kirks, Therapist, sports.

## 2016-12-13 NOTE — Progress Notes (Signed)
All d/c instructions explained and given to pt prior to d/c home.  Verbalized understanding.  Karie Kirks, Therapist, sports.

## 2016-12-13 NOTE — Interval H&P Note (Signed)
History and Physical Interval Note:  12/13/2016 2:20 PM  [name]  has presented today for surgery, with the diagnosis of snycope  The various methods of treatment have been discussed with the patient and family. After consideration of risks, benefits and other options for treatment, the patient has consented to  Procedure(s): Loop Recorder Insertion (N/A) as a surgical intervention .  The patient's history has been reviewed, patient examined, no change in status, stable for surgery.  I have reviewed the patient's chart and labs.  Questions were answered to the patient's satisfaction.     Tom Goodman

## 2016-12-13 NOTE — Consult Note (Signed)
ELECTROPHYSIOLOGY CONSULT NOTE    Patient ID: [name]: [id number], DOB/AGE: [birth year] 66 y.o.  Admit date: 12/12/2016 Date of Consult: 12/13/2016  Primary Physician: Kandice Hams, MD Primary Cardiologist: Elfredia Nevins MD: Gwenlyn Found  Reason for Consultation: syncope  HPI: Tom Goodman is a 66 y.o. male with a past medical history significant for CKD, CAD, hypertension, and hyperlipidemia.  He was in his USOH on the day of admission when he was driving down the road with his wife to go to [employer].  He developed sudden onset diaphoresis and clamminess and pulled over to the side of the road where he passed out. His wife was with him and says he was out for seconds. EMS was called and he vomited on the way to the hospital.  Echo this admission with normal LVEF.  Labs notable for creat of 1.4, elevated lactic acid on arrival.  He denies recent chest pain, shortness of breath, LE edema, recent fevers, chills, nausea or vomiting. He had not eaten much that morning since they were going out to eat but had drank a mountain dew on the way.  EP has been asked to evaluate for treatment options.   Past Medical History:  Diagnosis Date  . Cataracts, bilateral   . Chest pain   . Chronic kidney disease    stage  3  . Coronary atherosclerosis of native coronary artery    DES to OM1 on 03/09/09, DES to PL branch on 03/22/09 by Dr. Leonia Reeves, nuclear stress test 2013 - low risk,  . Diarrhea, functional   . Epigastric abdominal pain   . Essential hypertension, benign   . GERD (gastroesophageal reflux disease)   . 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 Hypercholesteremia   . Hypercholesteremia   . Intermediate coronary syndrome ([place])   . Kidney stones   . Mixed hyperlipidemia   . Osteoarthritis    left leg,back  . Sleep apnea    no cpap, needed     Surgical History:  Past Surgical History:  Procedure Laterality Date  . Bilateral thumb surgery Dr  Burney Gauze    . CARDIAC CATHETERIZATION  2010   Staged PCI of the second vessel because of renal problems  . COLONOSCOPY    . COLONOSCOPY WITH PROPOFOL N/A 03/20/2016   Procedure: COLONOSCOPY WITH PROPOFOL;  Surgeon: Garlan Fair, MD;  Location: [facility];  Service: Endoscopy;  Laterality: N/A;  . LEFT HEART CATHETERIZATION WITH CORONARY ANGIOGRAM N/A 11/23/2013   Procedure: LEFT HEART CATHETERIZATION WITH CORONARY ANGIOGRAM;  Surgeon: Jettie Booze, MD;  Location: [facility] CATH LAB;  Service: Cardiovascular;  Laterality: N/A;  . SHOULDER ARTHROSCOPY  2012   Left Feb, Right Sept  . TOTAL HIP ARTHROPLASTY Left 10/25/2015   Procedure: TOTAL HIP ARTHROPLASTY ANTERIOR APPROACH;  Surgeon: Melrose Nakayama, MD;  Location: [facility];  Service: Orthopedics;  Laterality: Left;  Marland Kitchen VASECTOMY       Prescriptions Prior to Admission  Medication Sig Dispense Refill Last Dose  . aspirin 81 MG tablet Take 81 mg by mouth daily.   12/12/2016 at Unknown time  . atorvastatin (LIPITOR) 40 MG tablet Take 1 tablet (40 mg total) by mouth daily. 90 tablet 0 12/12/2016 at Unknown time  . citalopram (CELEXA) 20 MG tablet Take 20 mg by mouth daily.    12/12/2016 at Unknown time  . clopidogrel (PLAVIX) 75 MG tablet Take 1 tablet (75 mg total) by mouth daily.  90 tablet 1 12/12/2016 at 1000  . gabapentin (NEURONTIN) 300 MG capsule Take 600 mg by mouth at bedtime.   2 12/11/2016 at Unknown time  . HYDROcodone-acetaminophen (NORCO) 10-325 MG tablet Take 1 tablet by mouth 2 (two) times daily as needed for moderate pain.   12/12/2016 at [place]  . isosorbide mononitrate (IMDUR) 30 MG 24 hr tablet Take 1 tablet (30 mg total) by mouth daily. 90 tablet 3 12/12/2016 at Unknown time  . loperamide (IMODIUM) 2 MG capsule Take 2 mg by mouth as needed for diarrhea or loose stools.   12/12/2016 at Unknown time  . pantoprazole (PROTONIX) 40 MG tablet Take 1 tablet (40 mg total) by mouth daily at 12 noon. 90 tablet 1 12/12/2016 at Unknown time  .  Probiotic Product (TRUNATURE DIGESTIVE PROBIOTIC) CAPS Take 1 capsule by mouth daily.   12/12/2016 at Unknown time  . nitroGLYCERIN (NITROSTAT) 0.4 MG SL tablet Place 1 tablet (0.4 mg total) under the tongue every 5 (five) minutes x 3 doses as needed for chest pain. 25 tablet 10 Unknown at Unknown    Inpatient Medications:  . aspirin  81 mg Oral Daily  . atorvastatin  40 mg Oral Daily  . clopidogrel  75 mg Oral Daily  . feeding supplement (ENSURE ENLIVE)  237 mL Oral BID BM  . gabapentin  600 mg Oral QHS  . isosorbide mononitrate  30 mg Oral Daily  . pantoprazole  40 mg Oral Q1200    Allergies:  Allergies  Allergen Reactions  . Corticosteroids Swelling and Other (See Comments)    Swelling of lips  . Prednisone Swelling and Other (See Comments)    Swollen lips    Social History   Social History  . Marital status: Married    Spouse name: N/A  . Number of children: N/A  . Years of education: N/A   Occupational History  . 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 Smoking status: Never Smoker  . Smokeless tobacco: Never Used  . Alcohol use No  . Drug use: No  . Sexual activity: Yes    Birth control/ protection: None   Other Topics Concern  . Not on file   Social History Narrative   Pt lives with wife, walks regularly.     Family History  Problem Relation Age of Onset  . Dementia Mother   . Stroke Mother   . Emphysema Father   . Hypertension Brother   . CVA Sister   . Stroke Sister   . Heart attack Neg Hx      Review of Systems: All other systems reviewed and are otherwise negative except as noted above.  Physical Exam: Vitals:   12/13/16 0550 12/13/16 0755 12/13/16 1009 12/13/16 1147  BP: (!) 161/77 (!) 169/71 (!) 162/76 (!) 148/97  Pulse: 65 (!) 57 61 63  Resp: 20   18  Temp: 98.3 F (36.8 C) 97.9 F (36.6 C)  97.2 F (36.2 C)  TempSrc: Oral Oral  Oral  SpO2: 95%   98%  Weight:      Height:        GEN- The patient is elderly  appearing, alert and oriented x 3 today.   HEENT: normocephalic, atraumatic; sclera clear, conjunctiva pink; hearing intact; oropharynx clear; neck supple  Lungs- Clear to ausculation bilaterally, normal work of breathing.  No wheezes, rales, rhonchi Heart- Regular rate and rhythm  GI- soft, non-tender, non-distended, bowel sounds present  Extremities- no clubbing, cyanosis,  or edema  MS- no significant deformity or atrophy Skin- warm and dry, no rash or lesion Psych- euthymic mood, full affect Neuro- strength and sensation are intact  Labs:   Lab Results  Component Value Date   WBC 6.7 12/12/2016   HGB 13.8 12/12/2016   HCT 42.6 12/12/2016   MCV 88.0 12/12/2016   PLT 214 12/12/2016    Recent Labs Lab 12/13/16 0710  NA 138  K 3.9  CL 109  CO2 24  BUN 13  CREATININE 1.22  CALCIUM 8.4*  GLUCOSE 95      Radiology/Studies: Dg Chest 2 View Result Date: 12/12/2016 CLINICAL DATA:  Driving and became hot complaining of epigastric pain EXAM: CHEST  2 VIEW COMPARISON:  11/06/2016 FINDINGS: Right lung is grossly clear. There is mild atelectasis or scar at the left base. Heart size is stable. No pneumothorax. IMPRESSION: Minimal atelectasis left lung base. Otherwise no radiographic evidence for acute cardiopulmonary abnormality. Electronically Signed   By: Donavan Foil M.D.   On: 12/12/2016 20:59   Dg Abd 1 View Result Date: 12/12/2016 CLINICAL DATA:  Epigastric pain EXAM: ABDOMEN - 1 VIEW COMPARISON:  None. FINDINGS: Lung bases are not included. Nonobstructed bowel-gas pattern. No abnormal calcifications. Status post left hip replacement. IMPRESSION: Nonobstructed bowel-gas pattern Electronically Signed   By: Donavan Foil M.D.   On: 12/12/2016 20:59    IPJ:ASNKN rhythm, rate 68, no acute ischemic changes (personally reviewed)  TELEMETRY: sinus rhythm (personally reviewed)  Assessment/Plan: 1.  Syncope The patient presents with a syncopal episode while driving. He had a prodrome  of diaphoresis.  While his story is not completely typical for arrhythmic syncope, the fact that the episode occurred while sitting is worrisome. We have discussed monitoring options including 30 day monitor vs implantable loop recorder. He has had episodes of pre-syncope in the past not always within a 30 day window and he would prefer implantable loop recorder. Risks, benefits reviewed with the patient who wishes to proceed. Will schedule at the next available time.  2.  CAD No recent ischemic symptoms Continue medical therapy  3.  HTN Stable No change required today   Signed, Chanetta Marshall, NP 12/13/2016 1:27 PM  I have seen, examined the patient, and reviewed the above assessment and plan.  On exam, RRR.  ekg reveals sinus rhythm, normal ekg.  Echo is benign.  Changes to above are made where necessary.  Given infrequent recurrent syncope/ presyncope, will proceed with ILR.  Risks of procedure discussed with the patient who wishes to proceed.  Co Sign: Thompson Grayer, MD 12/13/2016 2:18 PM

## 2016-12-13 NOTE — Progress Notes (Signed)
Echocardiogram 2D Echocardiogram has been performed.  Tom Goodman 12/13/2016, 9:25 AM

## 2016-12-13 NOTE — Progress Notes (Signed)
[facility]                                                                              Patient Demographics  Tom Goodman, is a 66 y.o. male, DOB - 01/25/1951, MEQ:[id number]  Admit date - 12/12/2016   Admitting Physician Toy Baker, MD  Outpatient Primary MD for the patient is Kandice Hams, MD  Outpatient specialists:   LOS - 0  days    Chief Complaint  Patient presents with  . Near Syncope       Brief summary   Patient is 66 year old male with CAD, HTN, HLD, CKD, OSA presented with syncopal episode. Per patient, he was driving on the road when he became nauseated and diaphoretic, he pulled over. Then he had a witnessed syncopal episode in the car by his wife. He had not described any chest pain or shortness of breath. Patient was admitted for further workup.   Assessment & Plan    Principal Problem:   Syncope and collapse Likely vasovagal - Witnessed syncopal episode while driving, possibly vasovagal - No repeat episodes overnight, no arrhythmias on the telemetry - Troponins negative, EKG showed no acute ST-T wave changes - Cardiology consulted, recommended EP consult, may need loop recorder. - 2-D echo showed EF of 55-60%, normal wall motion, no regional wall motion abnormalities - D-dimer negative, orthostatics negative   Active Problems:   Coronary atherosclerosis of native coronary artery/CAD  - History of CAD status post obtuse marginal branch and posterolateral branch PCI and drug-eluting stenting in June 2010 by Dr. Ilda Foil. He was recathed 5 years later and found have an occluded obtuse marginal branch that was collateralized, patent posterolateral branch with normal EF - Troponins 3 negative, no acute ST-T wave changes suggestive of ischemia, cardiology following - Continue aspirin, Plavix, statin, Imdur  Acute kidney injury with lactic acidosis - Unclear etiology, no recent illnesses or diarrhea or dehydration - Baseline  creatinine 1.2, presented with a creatinine of 1.4. Lactic acid of 2.0 - Overnight received IV fluid hydration, creatinine improved to 1.2     Essential hypertension, benign - BP currently elevated today, possibly due to anxiety - Continue Imdur, may add Norvasc at discharge    Hypercholesteremia - Continue statin    GERD (gastroesophageal reflux disease) - Continue PPI  Code Status: full code  DVT Prophylaxis:   SCD's Family Communication: Discussed in detail with the patient, all imaging results, lab results explained to the patient and wife Disposition Plan: Awaiting EP consult recommendations  Time Spent in minutes   25 minutes  Procedures:    Consultants:   Cardiology  Antimicrobials:      Medications  Scheduled Meds: . aspirin  81 mg Oral Daily  . atorvastatin  40 mg Oral Daily  . clopidogrel  75 mg Oral Daily  . gabapentin  600 mg Oral QHS  . isosorbide mononitrate  30 mg Oral Daily  . pantoprazole  40 mg Oral Q1200   Continuous Infusions: . sodium chloride 1,000 mL (12/13/16 0820)   PRN Meds:.   Antibiotics   Anti-infectives    None  Subjective:   [name] was seen and examined today.  Denies any specific complaints. No chest pain or shortness of breath, no syncopal episode again. Patient denies dizziness, abdominal pain, N/V/D/C, new weakness, numbess, tingling. No acute events overnight.    Objective:   Vitals:   12/13/16 0355 12/13/16 0550 12/13/16 0755 12/13/16 1009  BP:  (!) 161/77 (!) 169/71 (!) 162/76  Pulse:  65 (!) 57 61  Resp:  20    Temp:  98.3 F (36.8 C) 97.9 F (36.6 C)   TempSrc:  Oral Oral   SpO2:  95%    Weight: 79.7 kg (175 lb 12.8 oz)     Height:        Intake/Output Summary (Last 24 hours) at 12/13/16 1139 Last data filed at 12/13/16 0731  Gross per 24 hour  Intake              690 ml  Output              200 ml  Net              490 ml     Wt Readings from Last 3 Encounters:  12/13/16 79.7 kg  (175 lb 12.8 oz)  10/31/16 80.3 kg (177 lb 1.9 oz)  06/29/16 83.6 kg (184 lb 6.4 oz)     Exam  General: Alert and oriented x 3, NAD  HEENT:    Neck: Supple, no JVD  Cardiovascular: S1 S2 auscultated, no rubs, murmurs or gallops. Regular rate and rhythm.  Respiratory: Clear to auscultation bilaterally, no wheezing, rales or rhonchi  Gastrointestinal: Soft, nontender, nondistended, + bowel sounds  Ext: no cyanosis clubbing or edema  Neuro: AAOx3, Cr N's II- XII. Strength 5/5 upper and lower extremities bilaterally  Skin: No rashes  Psych: Normal affect and demeanor, alert and oriented x3    Data Reviewed:  I have personally reviewed following labs and imaging studies  Micro Results No results found for this or any previous visit (from the past 240 hour(s)).  Radiology Reports Dg Chest 2 View  Result Date: 12/12/2016 CLINICAL DATA:  Driving and became hot complaining of epigastric pain EXAM: CHEST  2 VIEW COMPARISON:  11/06/2016 FINDINGS: Right lung is grossly clear. There is mild atelectasis or scar at the left base. Heart size is stable. No pneumothorax. IMPRESSION: Minimal atelectasis left lung base. Otherwise no radiographic evidence for acute cardiopulmonary abnormality. Electronically Signed   By: Donavan Foil M.D.   On: 12/12/2016 20:59   Dg Abd 1 View  Result Date: 12/12/2016 CLINICAL DATA:  Epigastric pain EXAM: ABDOMEN - 1 VIEW COMPARISON:  None. FINDINGS: Lung bases are not included. Nonobstructed bowel-gas pattern. No abnormal calcifications. Status post left hip replacement. IMPRESSION: Nonobstructed bowel-gas pattern Electronically Signed   By: Donavan Foil M.D.   On: 12/12/2016 20:59    Lab Data:  CBC:  Recent Labs Lab 12/12/16 1709  WBC 6.7  HGB 13.8  HCT 42.6  MCV 88.0  PLT 426   Basic Metabolic Panel:  Recent Labs Lab 12/12/16 1709 12/13/16 0710  NA 138 138  K 4.2 3.9  CL 108 109  CO2 20* 24  GLUCOSE 133* 95  BUN 14 13  CREATININE  1.40* 1.22  CALCIUM 8.8* 8.4*   GFR: Estimated Creatinine Clearance: 60.7 mL/min (by C-G formula based on SCr of 1.22 mg/dL). Liver Function Tests: No results for input(s): AST, ALT, ALKPHOS, BILITOT, PROT, ALBUMIN in the last 168 hours. No results for  input(s): LIPASE, AMYLASE in the last 168 hours. No results for input(s): AMMONIA in the last 168 hours. Coagulation Profile: No results for input(s): INR, PROTIME in the last 168 hours. Cardiac Enzymes:  Recent Labs Lab 12/12/16 2211 12/13/16 0226 12/13/16 0710  TROPONINI <0.03 <0.03 <0.03   BNP (last 3 results) No results for input(s): PROBNP in the last 8760 hours. HbA1C: No results for input(s): HGBA1C in the last 72 hours. CBG: No results for input(s): GLUCAP in the last 168 hours. Lipid Profile: No results for input(s): CHOL, HDL, LDLCALC, TRIG, CHOLHDL, LDLDIRECT in the last 72 hours. Thyroid Function Tests: No results for input(s): TSH, T4TOTAL, FREET4, T3FREE, THYROIDAB in the last 72 hours. Anemia Panel: No results for input(s): VITAMINB12, FOLATE, FERRITIN, TIBC, IRON, RETICCTPCT in the last 72 hours. Urine analysis:    Component Value Date/Time   COLORURINE YELLOW 12/12/2016 2102   APPEARANCEUR CLEAR 12/12/2016 2102   LABSPEC 1.026 12/12/2016 2102   PHURINE 5.0 12/12/2016 2102   GLUCOSEU NEGATIVE 12/12/2016 2102   HGBUR NEGATIVE 12/12/2016 2102   BILIRUBINUR NEGATIVE 12/12/2016 2102   KETONESUR NEGATIVE 12/12/2016 2102   PROTEINUR NEGATIVE 12/12/2016 2102   UROBILINOGEN 0.2 03/10/2009 1100   NITRITE NEGATIVE 12/12/2016 2102   LEUKOCYTESUR NEGATIVE 12/12/2016 2102     Lason Eveland M.D. [facility] 12/13/2016, 11:39 AM  Pager: [phone] Between 7am to 7pm - call Pager - [phone]  After 7pm go to [email or web address] - password [email or web address]  Call night coverage person covering after 7pm

## 2016-12-13 NOTE — Progress Notes (Signed)
Progress Note  Patient Name: Tom Goodman Date of Encounter: 12/13/2016  Primary Cardiologist: Dr. Marlou Porch  Subjective   Feeling well. No chest pain, sob or palpitations.   Inpatient Medications    Scheduled Meds: . aspirin  81 mg Oral Daily  . atorvastatin  40 mg Oral Daily  . clopidogrel  75 mg Oral Daily  . gabapentin  600 mg Oral QHS  . isosorbide mononitrate  30 mg Oral Daily  . pantoprazole  40 mg Oral Q1200   Continuous Infusions: . sodium chloride 1,000 mL (12/13/16 0820)   PRN Meds:    Vital Signs    Vitals:   12/13/16 0355 12/13/16 0550 12/13/16 0755 12/13/16 1009  BP:  (!) 161/77 (!) 169/71 (!) 162/76  Pulse:  65 (!) 57 61  Resp:  20    Temp:  98.3 F (36.8 C) 97.9 F (36.6 C)   TempSrc:  Oral Oral   SpO2:  95%    Weight: 175 lb 12.8 oz (79.7 kg)     Height:        Intake/Output Summary (Last 24 hours) at 12/13/16 1119 Last data filed at 12/13/16 0731  Gross per 24 hour  Intake              690 ml  Output              200 ml  Net              490 ml   Filed Weights   12/12/16 2207 12/13/16 0355  Weight: 175 lb 4.8 oz (79.5 kg) 175 lb 12.8 oz (79.7 kg)    Telemetry    NSR- Personally Reviewed  ECG    NSR - Personally Reviewed  Physical Exam   GEN: No acute distress.   Neck: No JVD Cardiac: RRR, no murmurs, rubs, or gallops.  Respiratory: Clear to auscultation bilaterally. GI: Soft, nontender, non-distended  MS: No edema; No deformity. Neuro:  Nonfocal  Psych: Normal affect   Labs    Chemistry Recent Labs Lab 12/12/16 1709 12/13/16 0710  NA 138 138  K 4.2 3.9  CL 108 109  CO2 20* 24  GLUCOSE 133* 95  BUN 14 13  CREATININE 1.40* 1.22  CALCIUM 8.8* 8.4*  GFRNONAA 50* 60*  GFRAA 59* >60  ANIONGAP 10 5     Hematology Recent Labs Lab 12/12/16 1709  WBC 6.7  RBC 4.84  HGB 13.8  HCT 42.6  MCV 88.0  MCH 28.5  MCHC 32.4  RDW 12.8  PLT 214    Cardiac Enzymes Recent Labs Lab 12/12/16 2211 12/13/16 0226  12/13/16 0710  TROPONINI <0.03 <0.03 <0.03    Recent Labs Lab 12/12/16 1726  TROPIPOC 0.00     BNPNo results for input(s): BNP, PROBNP in the last 168 hours.   DDimer  Recent Labs Lab 12/12/16 2211  DDIMER 0.45     Radiology    Dg Chest 2 View  Result Date: 12/12/2016 CLINICAL DATA:  Driving and became hot complaining of epigastric pain EXAM: CHEST  2 VIEW COMPARISON:  11/06/2016 FINDINGS: Right lung is grossly clear. There is mild atelectasis or scar at the left base. Heart size is stable. No pneumothorax. IMPRESSION: Minimal atelectasis left lung base. Otherwise no radiographic evidence for acute cardiopulmonary abnormality. Electronically Signed   By: Donavan Foil M.D.   On: 12/12/2016 20:59   Dg Abd 1 View  Result Date: 12/12/2016 CLINICAL DATA:  Epigastric pain EXAM: ABDOMEN -  1 VIEW COMPARISON:  None. FINDINGS: Lung bases are not included. Nonobstructed bowel-gas pattern. No abnormal calcifications. Status post left hip replacement. IMPRESSION: Nonobstructed bowel-gas pattern Electronically Signed   By: Donavan Foil M.D.   On: 12/12/2016 20:59    Cardiac Studies    Echo 12/13/16 LV EF: 55% -   60%  ------------------------------------------------------------------- Indications:      Syncope 780.2.  ------------------------------------------------------------------- History:   PMH:   Chest pain.  Murmur.  Coronary artery disease.   ------------------------------------------------------------------- Study Conclusions  - Left ventricle: The cavity size was normal. Systolic function was   normal. The estimated ejection fraction was in the range of 55%   to 60%. Wall motion was normal; there were no regional wall   motion abnormalities. Left ventricular diastolic function   parameters were normal. - Aortic valve: There was trivial regurgitation. - Mitral valve: There was mild regurgitation. - Tricuspid valve: There was mild regurgitation. - Pulmonary  arteries: PA peak pressure: 41 mm Hg (S).  Impressions:  - The right ventricular systolic pressure was increased consistent   with moderate pulmonary hypertension.   Patient Profile     DAKARAI MCGLOCKLIN is a 66 y.o. male with coronary artery disease, DES to obtuse marginal one on 03/09/09 (occluded), DES to posterior lateral branch on 03/22/09 by Dr. Leonia Reeves, nuclear stress test 2013 which was low risk, with hypertension, hyperlipidemia here with syncope  Assessment & Plan    1: Syncope-Mr. Vore had a witnessed syncopal episode while driving 1/82/99. This occurred after he pulled over. He's had presyncopal episodes in the past. These episodes sound vagal. UDS positive for opioids. He did took hydrocodone yesterday, never had any problem. Telemetry without arrhythmias. Troponin negative. Echo showed normal EF with moderate pulmonary hypertension. No driving for 6 months.  - Likely loop recorder to rule out a cardiac etiology this admission. Will get  EP consult.   2: Coronary artery disease-S/p obtuse marginal branch and posterolateral branch PCI and drug-eluting stenting in June 2010 by Dr. Ilda Foil. He was recathed 5 years later and found have an occluded obtuse marginal branch that was collateralized, patent posterolateral branch with normal EF. He denies chest pain. Troponin negatives. EKG without acute changes. Continue ASA, Plavix, statin and Imdur.   3: Essential hypertension - BP elevated this morning. Not on any antihypertensive.   4: Hyperlipidemia- Continue statin   Signed, Morton, PA   12/13/2016, 11:19 AM    Agree with note by Robbie Lis PA-C  Patient admitted with syncope. His rhythm has been stable. His 2-D echo is essentially normal. He's had no chest pain. We've advised him about not driving for the next 6 months. I believe he would probably benefit from implantation of a loop recorder to rule out a bradycardia arrhythmic cause to his syncopal episode. His  symptoms sounded vagal. We'll get EP service to make that decision.. This could also be done as an outpatient.  Lorretta Harp, M.D., [place], [facility], [place] [address]. [place], [place]  [id number]  [phone] 12/13/2016 11:39 AM

## 2016-12-13 NOTE — Progress Notes (Signed)
Noted small amount of bleeding to pt's Lt CW.  Slight pressure applied.  Bleeding subsided.  Informed by Lovena Le in cath lab to do so if bleeding occur.  Karie Kirks, RN

## 2016-12-13 NOTE — Progress Notes (Signed)
At [place] Tom Goodman return call  informed  Her that pt's Loop recorder site of Lt. CW had small amount of bleeding to it and some pressure applied and bleeding had subsided.  Instructed to put a pressure dsg over site and for pt to leave dsg . On until tomorrow and if any further bleeding to call Dr. Jackalyn Lombard office.  Pt verbalized understanding.  Montay Vanvoorhis, Therapist, sports.

## 2016-12-13 NOTE — Progress Notes (Signed)
Initial Nutrition Assessment  DOCUMENTATION CODES:   Not applicable  INTERVENTION:   Ensure Enlive po BID, each supplement provides 350 kcal and 20 grams of protein  NUTRITION DIAGNOSIS:   Inadequate oral intake related to acute illness, poor appetite as evidenced by meal completion < 50%.  GOAL:   Patient will meet greater than or equal to 90% of their needs  MONITOR:   PO intake, Supplement acceptance, Labs, Weight trends  REASON FOR ASSESSMENT:   Malnutrition Screening Tool    ASSESSMENT:   66 y.o. male with medical history significant of CAD, HTN, HLD, CKD, OSA not on CPAP presents with syncopal event   Met with pt in room today. Pt reports good appetite and oral intake pta. Pt currently eating 50% meals. Per chart, pt has lost 9lbs(6%) in 6 months; this is not significant. Pt possibly to go home today. RD will order Ensure.   Medications reviewed and include: aspirin, plavix, protonix, NaCl   Labs reviewed: Ca 8.4(L)  Nutrition-Focused physical exam completed. Findings are no fat depletion, mild muscle depletion in clavicles, and no edema.   Diet Order:  Diet Heart Room service appropriate? Yes; Fluid consistency: Thin  Skin:  Reviewed, no issues  Last BM:  3/15  Height:   Ht Readings from Last 1 Encounters:  12/12/16 5' 10" (1.778 m)    Weight:   Wt Readings from Last 1 Encounters:  12/13/16 175 lb 12.8 oz (79.7 kg)    Ideal Body Weight:  75.4 kg  BMI:  Body mass index is 25.22 kg/m.  Estimated Nutritional Needs:   Kcal:  2000-2300kcal/day   Protein:  80-95g/day   Fluid:  2L/day   EDUCATION NEEDS:   No education needs identified at this time  Koleen Distance, RD, LDN Pager #[phone]

## 2016-12-13 NOTE — Discharge Summary (Signed)
Physician Discharge Summary   Patient ID: Tom Goodman MRN: [id number] DOB/AGE: July 08, 1951 66 y.o.  Admit date: 12/12/2016 Discharge date: 12/13/2016  Primary Care Physician:  Kandice Hams, MD  Discharge Diagnoses:    . Syncope and collapse . Coronary atherosclerosis of native coronary artery . Essential hypertension, benign . GERD (gastroesophageal reflux disease) . Hypercholesteremia . Sleep apnea . AKI (acute kidney injury) [facility])   Consults: Cardiology, EP cardiology  1. Recommendations for Outpatient Follow-up:  2. Please repeat CBC/BMET at next visit 3. Loop recorder placed   DIET: Heart healthy diet  Allergies:   Allergies  Allergen Reactions  . Corticosteroids Swelling and Other (See Comments)    Swelling of lips  . Prednisone Swelling and Other (See Comments)    Swollen lips     DISCHARGE MEDICATIONS: Current Discharge Medication List    CONTINUE these medications which have NOT CHANGED   Details  aspirin 81 MG tablet Take 81 mg by mouth daily.    atorvastatin (LIPITOR) 40 MG tablet Take 1 tablet (40 mg total) by mouth daily. Qty: 90 tablet, Refills: 0    clopidogrel (PLAVIX) 75 MG tablet Take 1 tablet (75 mg total) by mouth daily. Qty: 90 tablet, Refills: 1    gabapentin (NEURONTIN) 300 MG capsule Take 600 mg by mouth at bedtime.  Refills: 2    HYDROcodone-acetaminophen (NORCO) 10-325 MG tablet Take 1 tablet by mouth 2 (two) times daily as needed for moderate pain.    isosorbide mononitrate (IMDUR) 30 MG 24 hr tablet Take 1 tablet (30 mg total) by mouth daily. Qty: 90 tablet, Refills: 3    loperamide (IMODIUM) 2 MG capsule Take 2 mg by mouth as needed for diarrhea or loose stools.    pantoprazole (PROTONIX) 40 MG tablet Take 1 tablet (40 mg total) by mouth daily at 12 noon. Qty: 90 tablet, Refills: 1    Probiotic Product (TRUNATURE DIGESTIVE PROBIOTIC) CAPS Take 1 capsule by mouth daily.    nitroGLYCERIN (NITROSTAT) 0.4 MG SL tablet  Place 1 tablet (0.4 mg total) under the tongue every 5 (five) minutes x 3 doses as needed for chest pain. Qty: 25 tablet, Refills: 10      STOP taking these medications     citalopram (CELEXA) 20 MG tablet          Brief H and P: For complete details please refer to admission H and P, but in brief Patient is 66 year old male with CAD, HTN, HLD,CKD, OSA presented with syncopal episode. Per patient, he was driving on the road when he became nauseated and diaphoretic, he pulled over. Then he had a witnessed syncopal episode in the car by his wife. He had not described any chest pain or shortness of breath. Patient was admitted for further workup.    Hospital Course:  Principal Problem:   Syncope and collapse Likely vasovagal - Witnessed syncopal episode while driving, possibly vasovagal - No repeat episodes overnight, no arrhythmias on the telemetry - Troponins negative, EKG showed no acute ST-T wave changes - Cardiology consulted, recommended EP consult - 2-D echo showed EF of 55-60%, normal wall motion, no regional wall motion abnormalities - D-dimer negative, orthostatics negative - Patient underwent loop recorder placement. Follow-up also scheduled.     Coronary atherosclerosis of native coronary artery/CAD  - History of CAD status post obtuse marginal branch and posterolateral branch PCI and drug-eluting stenting in June 2010 by Dr. Ilda Foil. He was recathed 5 years later and found have an occluded obtuse marginal  branch that was collateralized, patent posterolateral branch with normal EF - Troponins 3 negative, no acute ST-T wave changes suggestive of ischemia, cardiology following - Continue aspirin, Plavix, statin, Imdur  Acute kidney injury with lactic acidosis - Unclear etiology, no recent illnesses or diarrhea or dehydration - Baseline creatinine 1.2, presented with a creatinine of 1.4. Lactic acid of 2.0 - Overnight received IV fluid hydration, creatinine improved to  1.2     Essential hypertension, benign - BP currently elevated today, possibly due to anxiety - Continue Imdur, may add Norvasc at discharge    Hypercholesteremia - Continue statin    GERD (gastroesophageal reflux disease) - Continue PPI  Day of Discharge BP 133/68   Pulse (!) 57   Temp 97.2 F (36.2 C) (Oral)   Resp 18   Ht 5\' 10"  (1.778 m)   Wt 79.7 kg (175 lb 12.8 oz) Comment: scale a  SpO2 98%   BMI 25.22 kg/m   Physical Exam: General: Alert and awake oriented x3 not in any acute distress. HEENT: anicteric sclera, pupils reactive to light and accommodation CVS: S1-S2 clear no murmur rubs or gallops Chest: clear to auscultation bilaterally, no wheezing rales or rhonchi Abdomen: soft nontender, nondistended, normal bowel sounds Extremities: no cyanosis, clubbing or edema noted bilaterally Neuro: Cranial nerves II-XII intact, no focal neurological deficits   The results of significant diagnostics from this hospitalization (including imaging, microbiology, ancillary and laboratory) are listed below for reference.    LAB RESULTS: Basic Metabolic Panel:  Recent Labs Lab 12/12/16 1709 12/13/16 0710  NA 138 138  K 4.2 3.9  CL 108 109  CO2 20* 24  GLUCOSE 133* 95  BUN 14 13  CREATININE 1.40* 1.22  CALCIUM 8.8* 8.4*   Liver Function Tests: No results for input(s): AST, ALT, ALKPHOS, BILITOT, PROT, ALBUMIN in the last 168 hours. No results for input(s): LIPASE, AMYLASE in the last 168 hours. No results for input(s): AMMONIA in the last 168 hours. CBC:  Recent Labs Lab 12/12/16 1709  WBC 6.7  HGB 13.8  HCT 42.6  MCV 88.0  PLT 214   Cardiac Enzymes:  Recent Labs Lab 12/13/16 0226 12/13/16 0710  TROPONINI <0.03 <0.03   BNP: Invalid input(s): POCBNP CBG: No results for input(s): GLUCAP in the last 168 hours.  Significant Diagnostic Studies:  Dg Chest 2 View  Result Date: 12/12/2016 CLINICAL DATA:  Driving and became hot complaining of  epigastric pain EXAM: CHEST  2 VIEW COMPARISON:  11/06/2016 FINDINGS: Right lung is grossly clear. There is mild atelectasis or scar at the left base. Heart size is stable. No pneumothorax. IMPRESSION: Minimal atelectasis left lung base. Otherwise no radiographic evidence for acute cardiopulmonary abnormality. Electronically Signed   By: Donavan Foil M.D.   On: 12/12/2016 20:59   Dg Abd 1 View  Result Date: 12/12/2016 CLINICAL DATA:  Epigastric pain EXAM: ABDOMEN - 1 VIEW COMPARISON:  None. FINDINGS: Lung bases are not included. Nonobstructed bowel-gas pattern. No abnormal calcifications. Status post left hip replacement. IMPRESSION: Nonobstructed bowel-gas pattern Electronically Signed   By: Donavan Foil M.D.   On: 12/12/2016 20:59    2D ECHO: Study Conclusions  - Left ventricle: The cavity size was normal. Systolic function was   normal. The estimated ejection fraction was in the range of 55%   to 60%. Wall motion was normal; there were no regional wall   motion abnormalities. Left ventricular diastolic function   parameters were normal. - Aortic valve: There was trivial regurgitation. -  Mitral valve: There was mild regurgitation. - Tricuspid valve: There was mild regurgitation. - Pulmonary arteries: PA peak pressure: 41 mm Hg (S).  Impressions:  - The right ventricular systolic pressure was increased consistent   with moderate pulmonary hypertension.  Disposition and Follow-up:    DISPOSITION: home    [place] Office Follow up on 12/27/2016.   Specialty:  Cardiology Why:  at [facility] for wound check  Contact information: [address], [place], [place]. Schedule an appointment as soon as possible for a visit in 10 day(s).   Specialty:  Internal Medicine Contact information: [address] [place]  [address] [phone]            Time spent on Discharge: 25 mins   Signed:   Cina Klumpp M.D. [facility] 12/13/2016, 5:42 PM Pager: [phone]

## 2016-12-14 ENCOUNTER — Encounter (HOSPITAL_COMMUNITY): Payer: Self-pay | Admitting: Internal Medicine

## 2016-12-27 ENCOUNTER — Ambulatory Visit (INDEPENDENT_AMBULATORY_CARE_PROVIDER_SITE_OTHER): Payer: Medicare Other | Admitting: *Deleted

## 2016-12-27 DIAGNOSIS — R55 Syncope and collapse: Secondary | ICD-10-CM

## 2016-12-28 NOTE — Progress Notes (Signed)
Wound check in clinic s/p ILR implant. Steri strips removed without redness or edema. Incision edges approximated. Wound well healed. Normal ILR device function. Battery status: GOOD. R-waves 0.3mV. (4) symptom episodes--SR per ECGs; patient states that he felt hot and clammy while at ophthalmologist's office.  0 tachy episodes, 0 pause episodes, 0 brady episodes. 0 AF episodes (0% burden). Patient education completed including wound care and remote monitoring. Monthly summary reports and ROV with JA in 3 months.

## 2017-01-14 ENCOUNTER — Ambulatory Visit (INDEPENDENT_AMBULATORY_CARE_PROVIDER_SITE_OTHER): Payer: Medicare Other | Admitting: *Deleted

## 2017-01-14 DIAGNOSIS — R55 Syncope and collapse: Secondary | ICD-10-CM

## 2017-01-14 NOTE — Progress Notes (Signed)
Carelink Summary Report / Loop Recorder 

## 2017-01-27 LAB — CUP PACEART REMOTE DEVICE CHECK
Implantable Pulse Generator Implant Date: 20180315
MDC IDC SESS DTM: 20180414204032

## 2017-01-27 NOTE — Progress Notes (Signed)
Carelink summary report received. Battery status OK. Normal device function. No new tachy episodes, brady, or pause episodes. No new AF episodes. 4 sx.- 2 w/ ECGs available, both appear SR through out. Monthly summary reports and ROV/PRN

## 2017-02-11 ENCOUNTER — Ambulatory Visit (INDEPENDENT_AMBULATORY_CARE_PROVIDER_SITE_OTHER): Payer: Medicare Other | Admitting: *Deleted

## 2017-02-11 DIAGNOSIS — R55 Syncope and collapse: Secondary | ICD-10-CM

## 2017-02-12 NOTE — Progress Notes (Signed)
Carelink Summary Report / Loop Recorder 

## 2017-02-15 LAB — CUP PACEART REMOTE DEVICE CHECK
Date Time Interrogation Session: 20180514213655
MDC IDC PG IMPLANT DT: 20180315

## 2017-02-27 ENCOUNTER — Encounter: Payer: Self-pay | Admitting: Internal Medicine

## 2017-03-13 ENCOUNTER — Ambulatory Visit (INDEPENDENT_AMBULATORY_CARE_PROVIDER_SITE_OTHER): Payer: Medicare Other | Admitting: *Deleted

## 2017-03-13 DIAGNOSIS — R55 Syncope and collapse: Secondary | ICD-10-CM | POA: Diagnosis not present

## 2017-03-14 NOTE — Progress Notes (Signed)
Carelink Summary Report / Loop Recorder 

## 2017-03-18 ENCOUNTER — Encounter: Payer: Self-pay | Admitting: Internal Medicine

## 2017-03-18 ENCOUNTER — Ambulatory Visit (INDEPENDENT_AMBULATORY_CARE_PROVIDER_SITE_OTHER): Payer: Medicare Other | Admitting: Internal Medicine

## 2017-03-18 VITALS — BP 126/78 | HR 76 | Ht 70.0 in | Wt 179.4 lb

## 2017-03-18 DIAGNOSIS — R55 Syncope and collapse: Secondary | ICD-10-CM | POA: Diagnosis not present

## 2017-03-18 NOTE — Patient Instructions (Signed)
Medication Instructions:  Your physician recommends that you continue on your current medications as directed. Please refer to the Current Medication list given to you today.  Labwork: None ordered.  Testing/Procedures: None ordered.  Follow-Up: Your physician recommends that you schedule a follow-up appointment as needed.   Any Other Special Instructions Will Be Listed Below (If Applicable).     If you need a refill on your cardiac medications before your next appointment, please call your pharmacy.   

## 2017-03-18 NOTE — Progress Notes (Signed)
PCP: Seward Carol, MD Primary Cardiologist: Dr Casey Burkitt is a 66 y.o. male who presents today for routine electrophysiology followup.  Since last being seen in our clinic, the patient reports doing very well.  Today, he denies symptoms of palpitations, chest pain, shortness of breath,  lower extremity edema, dizziness, presyncope, or syncope.  The patient is otherwise without complaint today.   Past Medical History:  Diagnosis Date  . Cataracts, bilateral   . Chest pain   . Chronic kidney disease    stage  3  . Coronary atherosclerosis of native coronary artery    DES to OM1 on 03/09/09, DES to PL branch on 03/22/09 by Dr. Leonia Reeves, nuclear stress test 2013 - low risk,  . Diarrhea, functional   . Epigastric abdominal pain   . Essential hypertension, benign   . GERD (gastroesophageal reflux disease)   . 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 Hypercholesteremia   . Hypercholesteremia   . Intermediate coronary syndrome ([place])   . Kidney stones   . Mixed hyperlipidemia   . Osteoarthritis    left leg,back  . Sleep apnea    no cpap, needed   Past Surgical History:  Procedure Laterality Date  . Bilateral thumb surgery Dr Burney Gauze    . CARDIAC CATHETERIZATION  2010   Staged PCI of the second vessel because of renal problems  . COLONOSCOPY    . COLONOSCOPY WITH PROPOFOL N/A 03/20/2016   Procedure: COLONOSCOPY WITH PROPOFOL;  Surgeon: Garlan Fair, MD;  Location: [facility];  Service: Endoscopy;  Laterality: N/A;  . LEFT HEART CATHETERIZATION WITH CORONARY ANGIOGRAM N/A 11/23/2013   Procedure: LEFT HEART CATHETERIZATION WITH CORONARY ANGIOGRAM;  Surgeon: Jettie Booze, MD;  Location: [facility] CATH LAB;  Service: Cardiovascular;  Laterality: N/A;  . LOOP RECORDER INSERTION N/A 12/13/2016   Procedure: Loop Recorder Insertion;  Surgeon: Thompson Grayer, MD;  Location: [place] CV LAB;  Service: Cardiovascular;  Laterality: N/A;  . SHOULDER  ARTHROSCOPY  2012   Left Feb, Right Sept  . TOTAL HIP ARTHROPLASTY Left 10/25/2015   Procedure: TOTAL HIP ARTHROPLASTY ANTERIOR APPROACH;  Surgeon: Melrose Nakayama, MD;  Location: [place];  Service: Orthopedics;  Laterality: Left;  Marland Kitchen VASECTOMY      ROS- all systems are reviewed and negatives except as per HPI above  Current Outpatient Prescriptions  Medication Sig Dispense Refill  . aspirin 81 MG tablet Take 81 mg by mouth daily.    Marland Kitchen atorvastatin (LIPITOR) 40 MG tablet Take 1 tablet (40 mg total) by mouth daily. 90 tablet 0  . citalopram (CELEXA) 20 MG tablet Take 20 mg by mouth daily.    . clopidogrel (PLAVIX) 75 MG tablet Take 1 tablet (75 mg total) by mouth daily. 90 tablet 1  . gabapentin (NEURONTIN) 400 MG capsule Take 1 capsule by mouth at bedtime.    Marland Kitchen HYDROcodone-acetaminophen (NORCO) 10-325 MG tablet Take 1 tablet by mouth 2 (two) times daily as needed for moderate pain.    . isosorbide mononitrate (IMDUR) 30 MG 24 hr tablet Take 1 tablet (30 mg total) by mouth daily. 90 tablet 3  . loperamide (IMODIUM) 2 MG capsule Take 2 mg by mouth as needed for diarrhea or loose stools.    . nitroGLYCERIN (NITROSTAT) 0.4 MG SL tablet Place 1 tablet (0.4 mg total) under the tongue every 5 (five) minutes x 3 doses as needed for chest pain. 25 tablet 10  .  Probiotic Product (TRUNATURE DIGESTIVE PROBIOTIC) CAPS Take 1 capsule by mouth daily.    . tamsulosin (FLOMAX) 0.4 MG CAPS capsule Take 0.4 mg by mouth daily.    . pantoprazole (PROTONIX) 40 MG tablet Take 1 tablet (40 mg total) by mouth daily at 12 noon. 90 tablet 1   No current facility-administered medications for this visit.     Physical Exam: Vitals:   03/18/17 1624  BP: 126/78  Pulse: 76  SpO2: 96%  Weight: 179 lb 6.4 oz (81.4 kg)  Height: 5\' 10"  (1.778 m)    GEN- The patient is well appearing, alert and oriented x 3 today.   Head- normocephalic, atraumatic Eyes-  Sclera clear, conjunctiva pink Ears- hearing intact Oropharynx-  clear Lungs- Clear to ausculation bilaterally, normal work of breathing Heart- Regular rate and rhythm, no murmurs, rubs or gallops, PMI not laterally displaced GI- soft, NT, ND, + BS Extremities- no clubbing, cyanosis, or edema  ILR is reviewed today and reveals no arrhythmias  Assessment and Plan:  1. Syncope No recurrence ILR reveals no arrhythmias  I will follow remotely with carelink and see as needed going forward He will follow with Dr Marlou Porch as scheduled  Thompson Grayer MD, [facility] 03/18/2017 4:52 PM

## 2017-03-19 ENCOUNTER — Other Ambulatory Visit: Payer: Self-pay | Admitting: Internal Medicine

## 2017-03-24 LAB — CUP PACEART REMOTE DEVICE CHECK
Implantable Pulse Generator Implant Date: 20180315
MDC IDC SESS DTM: 20180613220616

## 2017-03-24 NOTE — Progress Notes (Signed)
Carelink summary report received. Battery status OK. Normal device function. No new symptom episodes, tachy episodes, brady, or pause episodes. No new AF episodes. Monthly summary reports and ROV/PRN 

## 2017-04-08 LAB — CUP PACEART INCLINIC DEVICE CHECK
Date Time Interrogation Session: 20180618204135
Implantable Pulse Generator Implant Date: 20180315

## 2017-04-12 ENCOUNTER — Ambulatory Visit (INDEPENDENT_AMBULATORY_CARE_PROVIDER_SITE_OTHER): Payer: Medicare Other | Admitting: *Deleted

## 2017-04-12 DIAGNOSIS — R55 Syncope and collapse: Secondary | ICD-10-CM

## 2017-04-15 NOTE — Progress Notes (Signed)
Carelink Summary Report / Loop Recorder 

## 2017-04-18 LAB — CUP PACEART REMOTE DEVICE CHECK
Implantable Pulse Generator Implant Date: 20180315
MDC IDC SESS DTM: 20180713234023

## 2017-05-13 ENCOUNTER — Ambulatory Visit (INDEPENDENT_AMBULATORY_CARE_PROVIDER_SITE_OTHER): Payer: Medicare Other | Admitting: *Deleted

## 2017-05-13 DIAGNOSIS — R55 Syncope and collapse: Secondary | ICD-10-CM

## 2017-05-14 DIAGNOSIS — F3342 Major depressive disorder, recurrent, in full remission: Secondary | ICD-10-CM | POA: Insufficient documentation

## 2017-05-14 DIAGNOSIS — D692 Other nonthrombocytopenic purpura: Secondary | ICD-10-CM | POA: Insufficient documentation

## 2017-05-14 DIAGNOSIS — G629 Polyneuropathy, unspecified: Secondary | ICD-10-CM | POA: Insufficient documentation

## 2017-05-14 DIAGNOSIS — N1832 Chronic kidney disease, stage 3b: Secondary | ICD-10-CM | POA: Insufficient documentation

## 2017-05-18 LAB — CUP PACEART REMOTE DEVICE CHECK
Implantable Pulse Generator Implant Date: 20180315
MDC IDC SESS DTM: 20180813021151

## 2017-05-18 NOTE — Progress Notes (Signed)
Carelink summary report received. Battery status OK. Normal device function. No tachy episodes, brady, or pause episodes. No new AF episodes. 1 symptom- ECG appears SR. Monthly summary reports and ROV/PRN

## 2017-05-18 NOTE — Progress Notes (Signed)
Loop recorder summary report 

## 2017-06-11 ENCOUNTER — Ambulatory Visit (INDEPENDENT_AMBULATORY_CARE_PROVIDER_SITE_OTHER): Payer: Medicare Other | Admitting: *Deleted

## 2017-06-11 DIAGNOSIS — R55 Syncope and collapse: Secondary | ICD-10-CM

## 2017-06-12 NOTE — Progress Notes (Signed)
Carelink Summary Report / Loop Recorder 

## 2017-06-17 LAB — CUP PACEART REMOTE DEVICE CHECK
Date Time Interrogation Session: 20180912020919
MDC IDC PG IMPLANT DT: 20180315

## 2017-07-11 ENCOUNTER — Ambulatory Visit (INDEPENDENT_AMBULATORY_CARE_PROVIDER_SITE_OTHER): Payer: Medicare Other | Admitting: *Deleted

## 2017-07-11 DIAGNOSIS — R55 Syncope and collapse: Secondary | ICD-10-CM | POA: Diagnosis not present

## 2017-07-12 NOTE — Progress Notes (Signed)
Carelink Summary Report / Loop Recorder 

## 2017-07-16 LAB — CUP PACEART REMOTE DEVICE CHECK
Implantable Pulse Generator Implant Date: 20180315
MDC IDC SESS DTM: 20181012021159

## 2017-07-31 ENCOUNTER — Ambulatory Visit (INDEPENDENT_AMBULATORY_CARE_PROVIDER_SITE_OTHER): Payer: Medicare Other | Admitting: Cardiology

## 2017-07-31 ENCOUNTER — Encounter: Payer: Self-pay | Admitting: Cardiology

## 2017-07-31 VITALS — BP 126/60 | HR 78 | Ht 70.0 in | Wt 185.8 lb

## 2017-07-31 DIAGNOSIS — R55 Syncope and collapse: Secondary | ICD-10-CM | POA: Diagnosis not present

## 2017-07-31 DIAGNOSIS — E782 Mixed hyperlipidemia: Secondary | ICD-10-CM | POA: Diagnosis not present

## 2017-07-31 DIAGNOSIS — I2510 Atherosclerotic heart disease of native coronary artery without angina pectoris: Secondary | ICD-10-CM

## 2017-07-31 NOTE — Patient Instructions (Signed)
Medication Instructions:  Please discontinue your Isosorbide. Continue all other medications as listed.  Follow-Up: Follow up in 1 year with Dr. Marlou Porch.  You will receive a letter in the mail 2 months before you are due.  Please call us when you receive this letter to schedule your follow up appointment.  If you need a refill on your cardiac medications before your next appointment, please call your pharmacy.  Thank you for choosing [facility]!!

## 2017-07-31 NOTE — Progress Notes (Signed)
[facility] [address], [place]  [address] Phone: [phone] Fax:  [phone]  Date:  07/31/2017   ID:  Tom Goodman, DOB [birth year], MRN [id number]  PCP:  Tom Carol, MD   History of Present Illness: Tom Goodman is a 66 y.o. male with coronary artery disease, DES to obtuse marginal one on 03/09/09 (occluded), DES to posterior lateral branch on 03/22/09 by Dr. Leonia Goodman, nuclear stress test 2013 which was low risk, with hypertension, hyperlipidemia here for followup.    He was hospitalized on 11/22/13 with symptoms of feeling weak, pale appearance while at church. These are similar symptoms to his prior stenting. He underwent cardiac catheterization which showed occluded obtuse marginal stent, 50% RCA, patent DES in the posterior lateral branch. Collateral blood flow was noted to obtuse marginal. Normal EF. He was started on isosorbide. Troponin was negative.  I take care of his wife Tom Goodman (has "spells" when she has to groom dogs, ?anxiety).  Lost his job with the [employer], [employer] Goodman department.  07/31/17-he ended up having a syncopal episode in March 2018, was able to pull over his car with his wife and when driving.  He was starting to feel hot, sweaty.  He asked her to turn up the air conditioner.  After he stopped, he fainted.  His wife had to slap him to try to wake him up.  EMS came and he felt better.  He has not had any episodes since.  In the hospital, by Dr. Rayann Goodman saw him and placed a loop recorder.  It has been normal since.  He is not having any chest pain, bleeding, orthopnea, PND    Wt Readings from Last 3 Encounters:  07/31/17 185 lb 12.8 oz (84.3 kg)  03/18/17 179 lb 6.4 oz (81.4 kg)  12/13/16 175 lb 12.8 oz (79.7 kg)     Past Medical History:  Diagnosis Date  . Cataracts, bilateral   . Chest pain   . Chronic kidney disease    stage  3  . Coronary atherosclerosis of native coronary artery    DES to OM1 on 03/09/09, DES to  PL branch on 03/22/09 by Dr. Leonia Goodman, nuclear stress test 2013 - low risk,  . Diarrhea, functional   . Epigastric abdominal pain   . Essential hypertension, benign   . GERD (gastroesophageal reflux disease)   . 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 Hypercholesteremia   . Hypercholesteremia   . Intermediate coronary syndrome ([place])   . Kidney stones   . Mixed hyperlipidemia   . Osteoarthritis    left leg,back  . Sleep apnea    no cpap, needed    Past Surgical History:  Procedure Laterality Date  . Bilateral thumb surgery Dr Burney Gauze    . CARDIAC CATHETERIZATION  2010   Staged PCI of the second vessel because of renal problems  . COLONOSCOPY    . COLONOSCOPY WITH PROPOFOL N/A 03/20/2016   Procedure: COLONOSCOPY WITH PROPOFOL;  Surgeon: Garlan Fair, MD;  Location: [facility];  Service: Endoscopy;  Laterality: N/A;  . LEFT HEART CATHETERIZATION WITH CORONARY ANGIOGRAM N/A 11/23/2013   Procedure: LEFT HEART CATHETERIZATION WITH CORONARY ANGIOGRAM;  Surgeon: Jettie Booze, MD;  Location: [facility] CATH LAB;  Service: Cardiovascular;  Laterality: N/A;  . LOOP RECORDER INSERTION N/A 12/13/2016   Procedure: Loop Recorder Insertion;  Surgeon: Thompson Grayer, MD;  Location: [place]  CV LAB;  Service: Cardiovascular;  Laterality: N/A;  . SHOULDER ARTHROSCOPY  2012   Left Feb, Right Sept  . TOTAL HIP ARTHROPLASTY Left 10/25/2015   Procedure: TOTAL HIP ARTHROPLASTY ANTERIOR APPROACH;  Surgeon: Melrose Nakayama, MD;  Location: [place];  Service: Orthopedics;  Laterality: Left;  Marland Kitchen VASECTOMY      Current Outpatient Prescriptions  Medication Sig Dispense Refill  . aspirin 81 MG tablet Take 81 mg by mouth daily.    Marland Kitchen atorvastatin (LIPITOR) 40 MG tablet Take 1 tablet (40 mg total) by mouth daily. 90 tablet 0  . citalopram (CELEXA) 20 MG tablet Take 20 mg by mouth daily.    . clopidogrel (PLAVIX) 75 MG tablet Take 1 tablet (75 mg total) by mouth daily. 90 tablet 1  .  gabapentin (NEURONTIN) 400 MG capsule Take 1 capsule by mouth at bedtime.    Marland Kitchen HYDROcodone-acetaminophen (NORCO) 10-325 MG tablet Take 1 tablet by mouth 2 (two) times daily as needed for moderate pain.    Marland Kitchen loperamide (IMODIUM) 2 MG capsule Take 2 mg by mouth as needed for diarrhea or loose stools.    . nitroGLYCERIN (NITROSTAT) 0.4 MG SL tablet Place 1 tablet (0.4 mg total) under the tongue every 5 (five) minutes x 3 doses as needed for chest pain. 25 tablet 10  . Probiotic Product (TRUNATURE DIGESTIVE PROBIOTIC) CAPS Take 1 capsule by mouth daily.    . tamsulosin (FLOMAX) 0.4 MG CAPS capsule Take 0.4 mg by mouth daily.    . pantoprazole (PROTONIX) 40 MG tablet Take 1 tablet (40 mg total) by mouth daily at 12 noon. 90 tablet 1   No current facility-administered medications for this visit.     Allergies:    Allergies  Allergen Reactions  . Corticosteroids Swelling and Other (See Comments)    Swelling of lips  . Prednisone Swelling and Other (See Comments)    Swollen lips    Social History:  The patient  reports that he has never smoked. He has never used smokeless tobacco. He reports that he does not drink alcohol or use drugs.   ROS:  Please see the history of present illness.  No chest pain, no shortness of breath.    PHYSICAL EXAM: VS:  BP 126/60   Pulse 78   Ht 5\' 10"  (1.778 m)   Wt 185 lb 12.8 oz (84.3 kg)   SpO2 98%   BMI 26.66 kg/m  GEN: Well nourished, well developed, in no acute distress  HEENT: normal  Neck: no JVD, carotid bruits, or masses Cardiac: RRR; no murmurs, rubs, or gallops,no edema  Respiratory:  clear to auscultation bilaterally, normal work of breathing GI: soft, nontender, nondistended, + BS MS: no deformity or atrophy  Skin: warm and dry, no rash Neuro:  Alert and Oriented x 3, Strength and sensation are intact Psych: euthymic mood, full affect   EKG: Today 10/31/16-sinus rhythm 74 with no other abnormalities, no ischemic changes personally  viewed-prior 06/29/16-sinus rhythm, PACs. Personally viewed-prior 03/02/15-normal rhythm, 75, no other abnormalities.   Labs: 10/14-LDL 51, creatinine 1.3    ASSESSMENT AND PLAN:  1. Unexplained syncope-March 2018.  Loop recorder placed.  Currently no adverse arrhythmias.  Dr. Rayann Goodman.  He was driving and began to feel hot, sweaty then pulled over and the next thing he  2. Coronary artery disease-obtuse marginal branch and posterior lateral branch PCI June 2010 cath in 2015 showed occluded obtuse marginal branch that had collateral blood flow.  Normal EF.  Plavix,  we will stop isosorbide. Currently doing well. No symptoms.  No angina. 3. Hyperlipidemia-on atorvastatin 40. LDL goal less than 70. LDL 51. 4. Chronic kidney disease stage II-he is trying to avoid NSAIDs. May need other therapeutic options for his arthritis. Getting epidural injections for his back.  Stable. 5. Hypotension -much improved after stopping lisinopril. Previously was having dizziness, blood pressure running in the 90 range.  Stopping isosorbide. 6. 1 year follow-up  Signed, Candee Furbish, MD [facility]  07/31/2017 4:57 PM

## 2017-08-12 ENCOUNTER — Ambulatory Visit (INDEPENDENT_AMBULATORY_CARE_PROVIDER_SITE_OTHER): Payer: Medicare Other | Admitting: *Deleted

## 2017-08-12 DIAGNOSIS — R55 Syncope and collapse: Secondary | ICD-10-CM | POA: Diagnosis not present

## 2017-08-12 NOTE — Progress Notes (Signed)
Carelink Summary Report / Loop Recorder 

## 2017-08-19 LAB — CUP PACEART REMOTE DEVICE CHECK
Implantable Pulse Generator Implant Date: 20180315
MDC IDC SESS DTM: 20181111031700

## 2017-09-09 ENCOUNTER — Ambulatory Visit (INDEPENDENT_AMBULATORY_CARE_PROVIDER_SITE_OTHER): Payer: Medicare Other | Admitting: *Deleted

## 2017-09-09 DIAGNOSIS — R55 Syncope and collapse: Secondary | ICD-10-CM | POA: Diagnosis not present

## 2017-09-10 NOTE — Progress Notes (Signed)
Carelink Summary Report / Loop Recorder 

## 2017-09-16 LAB — CUP PACEART REMOTE DEVICE CHECK
Date Time Interrogation Session: 20181211033917
Implantable Pulse Generator Implant Date: 20180315

## 2017-10-09 ENCOUNTER — Ambulatory Visit (INDEPENDENT_AMBULATORY_CARE_PROVIDER_SITE_OTHER): Payer: Medicare PPO | Admitting: *Deleted

## 2017-10-09 DIAGNOSIS — R55 Syncope and collapse: Secondary | ICD-10-CM | POA: Diagnosis not present

## 2017-10-10 NOTE — Progress Notes (Signed)
Carelink Summary Report / Loop recorder 

## 2017-10-21 LAB — CUP PACEART REMOTE DEVICE CHECK
Implantable Pulse Generator Implant Date: 20180315
MDC IDC SESS DTM: 20190110034011

## 2017-11-08 ENCOUNTER — Ambulatory Visit (INDEPENDENT_AMBULATORY_CARE_PROVIDER_SITE_OTHER): Payer: Medicare PPO | Admitting: *Deleted

## 2017-11-08 ENCOUNTER — Telehealth: Payer: Self-pay | Admitting: Cardiology

## 2017-11-08 DIAGNOSIS — R55 Syncope and collapse: Secondary | ICD-10-CM | POA: Diagnosis not present

## 2017-11-08 NOTE — Telephone Encounter (Signed)
New message    Patient calling wants to know if he taken any blood pressure medication wife thinks he is suppose to be taken some medication. Patient needs clarification.

## 2017-11-08 NOTE — Telephone Encounter (Signed)
Reviewed pt's chart and all BP medications were d/ced in October after his syncopal episode and BP was in the 90's.  Pt is aware of this.  He will continue to monitor his BP at home and c/b if he should see a consistent increase.  He thanked me for my time and the information.

## 2017-11-11 NOTE — Progress Notes (Signed)
Carelink Summary Report / Loop Recorder 

## 2017-12-03 LAB — CUP PACEART REMOTE DEVICE CHECK
Date Time Interrogation Session: 20190209100035
Implantable Pulse Generator Implant Date: 20180315

## 2017-12-11 ENCOUNTER — Ambulatory Visit (INDEPENDENT_AMBULATORY_CARE_PROVIDER_SITE_OTHER): Payer: Medicare PPO | Admitting: *Deleted

## 2017-12-11 DIAGNOSIS — R55 Syncope and collapse: Secondary | ICD-10-CM | POA: Diagnosis not present

## 2017-12-12 NOTE — Progress Notes (Signed)
Carelink Summary Report / Loop Recorder 

## 2018-01-08 ENCOUNTER — Telehealth: Payer: Self-pay | Admitting: Cardiology

## 2018-01-08 NOTE — Telephone Encounter (Signed)
Spoke w/ pt and requested that he send a manual transmission b/c his home monitor has not updated in at least 14 days.   

## 2018-01-13 ENCOUNTER — Ambulatory Visit (INDEPENDENT_AMBULATORY_CARE_PROVIDER_SITE_OTHER): Payer: Medicare PPO | Admitting: *Deleted

## 2018-01-13 DIAGNOSIS — R55 Syncope and collapse: Secondary | ICD-10-CM | POA: Diagnosis not present

## 2018-01-14 NOTE — Progress Notes (Signed)
Carelink Summary Report / Loop Recorder 

## 2018-01-20 LAB — CUP PACEART REMOTE DEVICE CHECK
Date Time Interrogation Session: 20190314114051
MDC IDC PG IMPLANT DT: 20180315

## 2018-02-12 LAB — CUP PACEART REMOTE DEVICE CHECK
Date Time Interrogation Session: 20190416120623
MDC IDC PG IMPLANT DT: 20180315

## 2018-02-17 ENCOUNTER — Other Ambulatory Visit: Payer: Self-pay | Admitting: Internal Medicine

## 2018-02-17 ENCOUNTER — Ambulatory Visit (INDEPENDENT_AMBULATORY_CARE_PROVIDER_SITE_OTHER): Payer: Medicare PPO | Admitting: *Deleted

## 2018-02-17 DIAGNOSIS — R55 Syncope and collapse: Secondary | ICD-10-CM

## 2018-02-17 DIAGNOSIS — R1084 Generalized abdominal pain: Secondary | ICD-10-CM

## 2018-02-17 NOTE — Progress Notes (Signed)
Carelink Summary Report / Loop Recorder 

## 2018-02-20 ENCOUNTER — Ambulatory Visit
Admission: RE | Admit: 2018-02-20 | Discharge: 2018-02-20 | Disposition: A | Discharge status: Home / Self Care | Payer: Medicare PPO | Source: Ambulatory Visit | Attending: Internal Medicine | Admitting: Internal Medicine

## 2018-02-20 DIAGNOSIS — R1084 Generalized abdominal pain: Secondary | ICD-10-CM

## 2018-03-17 LAB — CUP PACEART REMOTE DEVICE CHECK
Date Time Interrogation Session: 20190519121010
MDC IDC PG IMPLANT DT: 20180315

## 2018-03-21 ENCOUNTER — Ambulatory Visit (INDEPENDENT_AMBULATORY_CARE_PROVIDER_SITE_OTHER): Payer: Medicare PPO | Admitting: *Deleted

## 2018-03-21 DIAGNOSIS — R55 Syncope and collapse: Secondary | ICD-10-CM | POA: Diagnosis not present

## 2018-03-21 NOTE — Progress Notes (Signed)
Carelink Summary Report / Loop Recorder 

## 2018-04-23 ENCOUNTER — Ambulatory Visit (INDEPENDENT_AMBULATORY_CARE_PROVIDER_SITE_OTHER): Payer: Medicare PPO | Admitting: *Deleted

## 2018-04-23 DIAGNOSIS — R55 Syncope and collapse: Secondary | ICD-10-CM | POA: Diagnosis not present

## 2018-04-23 NOTE — Progress Notes (Signed)
Carelink Summary Report / Loop Recorder 

## 2018-04-24 ENCOUNTER — Telehealth: Payer: Self-pay | Admitting: Cardiology

## 2018-04-24 LAB — CUP PACEART REMOTE DEVICE CHECK
Date Time Interrogation Session: 20190621120822
MDC IDC PG IMPLANT DT: 20180315

## 2018-04-24 NOTE — Telephone Encounter (Signed)
LMOVM requesting that pt send manual transmission b/c home monitor has not updated in at least 14 days.    

## 2018-05-26 ENCOUNTER — Ambulatory Visit (INDEPENDENT_AMBULATORY_CARE_PROVIDER_SITE_OTHER): Payer: Medicare PPO | Admitting: *Deleted

## 2018-05-26 DIAGNOSIS — R55 Syncope and collapse: Secondary | ICD-10-CM | POA: Diagnosis not present

## 2018-05-26 NOTE — Progress Notes (Signed)
Carelink Summary Report / Loop Recorder 

## 2018-06-06 LAB — CUP PACEART REMOTE DEVICE CHECK
Implantable Pulse Generator Implant Date: 20180315
MDC IDC SESS DTM: 20190724124035

## 2018-06-24 LAB — CUP PACEART REMOTE DEVICE CHECK
Implantable Pulse Generator Implant Date: 20180315
MDC IDC SESS DTM: 20190826141026

## 2018-06-26 DIAGNOSIS — Z79899 Other long term (current) drug therapy: Secondary | ICD-10-CM | POA: Insufficient documentation

## 2018-06-30 ENCOUNTER — Ambulatory Visit (INDEPENDENT_AMBULATORY_CARE_PROVIDER_SITE_OTHER): Payer: Medicare PPO | Admitting: *Deleted

## 2018-06-30 DIAGNOSIS — R55 Syncope and collapse: Secondary | ICD-10-CM

## 2018-06-30 NOTE — Progress Notes (Signed)
Carelink Summary Report / Loop Recorder 

## 2018-07-01 LAB — CUP PACEART REMOTE DEVICE CHECK
Implantable Pulse Generator Implant Date: 20180315
MDC IDC SESS DTM: 20190928143538

## 2018-07-31 ENCOUNTER — Ambulatory Visit (INDEPENDENT_AMBULATORY_CARE_PROVIDER_SITE_OTHER): Payer: Medicare PPO | Admitting: *Deleted

## 2018-07-31 DIAGNOSIS — R55 Syncope and collapse: Secondary | ICD-10-CM

## 2018-07-31 DIAGNOSIS — I10 Essential (primary) hypertension: Secondary | ICD-10-CM

## 2018-08-01 NOTE — Progress Notes (Signed)
Remote pacemaker transmission.   

## 2018-08-22 LAB — CUP PACEART REMOTE DEVICE CHECK
Implantable Pulse Generator Implant Date: 20180315
MDC IDC SESS DTM: 20191031154113

## 2018-09-02 ENCOUNTER — Ambulatory Visit (INDEPENDENT_AMBULATORY_CARE_PROVIDER_SITE_OTHER): Payer: Medicare PPO

## 2018-09-02 DIAGNOSIS — R55 Syncope and collapse: Secondary | ICD-10-CM | POA: Diagnosis not present

## 2018-09-03 NOTE — Progress Notes (Signed)
Carelink Summary Report / Loop Recorder 

## 2018-10-01 LAB — CUP PACEART REMOTE DEVICE CHECK
Date Time Interrogation Session: 20191203153800
MDC IDC PG IMPLANT DT: 20180315

## 2018-10-06 ENCOUNTER — Ambulatory Visit (INDEPENDENT_AMBULATORY_CARE_PROVIDER_SITE_OTHER): Payer: Medicare PPO

## 2018-10-06 DIAGNOSIS — R55 Syncope and collapse: Secondary | ICD-10-CM

## 2018-10-06 LAB — CUP PACEART REMOTE DEVICE CHECK
Implantable Pulse Generator Implant Date: 20180315
MDC IDC SESS DTM: 20200105170815

## 2018-10-07 NOTE — Progress Notes (Signed)
Carelink Summary Report / Loop Recorder 

## 2018-11-07 ENCOUNTER — Ambulatory Visit (INDEPENDENT_AMBULATORY_CARE_PROVIDER_SITE_OTHER): Payer: Medicare PPO

## 2018-11-07 DIAGNOSIS — R55 Syncope and collapse: Secondary | ICD-10-CM | POA: Diagnosis not present

## 2018-11-07 LAB — CUP PACEART REMOTE DEVICE CHECK
Date Time Interrogation Session: 20200207170545
MDC IDC PG IMPLANT DT: 20180315

## 2018-11-12 ENCOUNTER — Telehealth: Payer: Self-pay

## 2018-11-12 NOTE — Telephone Encounter (Signed)
Pt called stating that he is being billed a $50 co payment for his monthly for his home remote appointments. I asked him will he be able to afford it if we do the appointments every 3 months. He did not want that. He wanted to turn the monitor in. I asked him did he have a symptom activator and he stated that he did. Theodoro Doing, RN advised me to tell the pt to keep the monitor and we will not bill him every month anymore. We will follow him in [facility] just in case he do have a symptom or passes out again. The patient agreed with understanding.

## 2018-11-18 NOTE — Progress Notes (Signed)
Carelink Summary Report / Loop Recorder 

## 2018-12-10 ENCOUNTER — Ambulatory Visit (INDEPENDENT_AMBULATORY_CARE_PROVIDER_SITE_OTHER): Payer: Medicare PPO | Admitting: *Deleted

## 2018-12-10 DIAGNOSIS — R55 Syncope and collapse: Secondary | ICD-10-CM | POA: Diagnosis not present

## 2018-12-11 LAB — CUP PACEART REMOTE DEVICE CHECK
Implantable Pulse Generator Implant Date: 20180315
MDC IDC SESS DTM: 20200311184158

## 2018-12-17 NOTE — Progress Notes (Signed)
Carelink Summary Report / Loop Recorder 

## 2019-01-12 ENCOUNTER — Ambulatory Visit (INDEPENDENT_AMBULATORY_CARE_PROVIDER_SITE_OTHER): Payer: Medicare PPO | Admitting: *Deleted

## 2019-01-12 ENCOUNTER — Other Ambulatory Visit: Payer: Self-pay

## 2019-01-12 DIAGNOSIS — R55 Syncope and collapse: Secondary | ICD-10-CM

## 2019-01-12 LAB — CUP PACEART REMOTE DEVICE CHECK
Date Time Interrogation Session: 20200413190109
Implantable Pulse Generator Implant Date: 20180315

## 2019-01-23 NOTE — Progress Notes (Signed)
Carelink Summary Report / Loop Recorder 

## 2019-02-16 ENCOUNTER — Other Ambulatory Visit: Payer: Self-pay

## 2019-02-16 ENCOUNTER — Ambulatory Visit (INDEPENDENT_AMBULATORY_CARE_PROVIDER_SITE_OTHER): Payer: Medicare PPO | Admitting: *Deleted

## 2019-02-16 DIAGNOSIS — R55 Syncope and collapse: Secondary | ICD-10-CM

## 2019-02-16 LAB — CUP PACEART REMOTE DEVICE CHECK
Date Time Interrogation Session: 20200516194312
Implantable Pulse Generator Implant Date: 20180315

## 2019-02-24 ENCOUNTER — Telehealth: Payer: Self-pay | Admitting: Internal Medicine

## 2019-02-24 NOTE — Telephone Encounter (Signed)
New message   Patient states that he was $150.14 for your loop recorder. Patient states that he was advised that the did not have to pay and just keep it and not turn the loop recorder back in. Please call to discuss.

## 2019-02-24 NOTE — Telephone Encounter (Signed)
LMOVM for pt to call my direct office number. °

## 2019-02-25 NOTE — Telephone Encounter (Signed)
I spoke with the pt and explained to him per Amber that we was going to check into getting the Charge reverse. I ordered him a returned kit to send the monitor back in. That is the only way to guarantee that he will not be charged for the summary reports. I let him know we can check him in office if he have any symptoms. The patient expressed understanding and thank me for calling and trying to help him with the charges.

## 2019-02-25 NOTE — Progress Notes (Signed)
Carelink Summary Report / Loop Recorder 

## 2019-03-19 ENCOUNTER — Encounter: Payer: Medicare PPO | Admitting: *Deleted

## 2019-03-19 LAB — CUP PACEART REMOTE DEVICE CHECK
Date Time Interrogation Session: 20200618181453
Implantable Pulse Generator Implant Date: 20180315

## 2019-03-27 ENCOUNTER — Telehealth: Payer: Self-pay

## 2019-03-27 NOTE — Telephone Encounter (Signed)
Left message for patient to remind of missed remote transmission.  

## 2019-07-09 ENCOUNTER — Telehealth: Payer: Self-pay

## 2019-07-09 NOTE — Telephone Encounter (Signed)
Called pt to set up possible evisit with JM for 07/13/2019. Left message asking pt to call the office.

## 2019-11-26 ENCOUNTER — Other Ambulatory Visit
Admission: RE | Admit: 2019-11-26 | Discharge: 2019-11-26 | Disposition: A | Discharge status: Home / Self Care | Payer: Medicare PPO | Source: Ambulatory Visit | Attending: Physician Assistant | Admitting: Physician Assistant

## 2019-11-26 DIAGNOSIS — R0789 Other chest pain: Secondary | ICD-10-CM | POA: Insufficient documentation

## 2019-11-26 LAB — BRAIN NATRIURETIC PEPTIDE: B Natriuretic Peptide: 37 pg/mL (ref 0.0–100.0)

## 2020-05-05 ENCOUNTER — Other Ambulatory Visit: Payer: Self-pay

## 2020-05-05 ENCOUNTER — Other Ambulatory Visit: Payer: Self-pay | Admitting: Family Medicine

## 2020-05-05 ENCOUNTER — Ambulatory Visit
Admission: RE | Admit: 2020-05-05 | Discharge: 2020-05-05 | Disposition: A | Discharge status: Home / Self Care | Payer: Medicare PPO | Source: Ambulatory Visit | Attending: Family Medicine | Admitting: Family Medicine

## 2020-05-05 DIAGNOSIS — M79662 Pain in left lower leg: Secondary | ICD-10-CM | POA: Insufficient documentation

## 2020-05-05 DIAGNOSIS — R202 Paresthesia of skin: Secondary | ICD-10-CM

## 2020-05-17 ENCOUNTER — Other Ambulatory Visit: Payer: Self-pay | Admitting: Physical Medicine and Rehabilitation

## 2020-05-17 DIAGNOSIS — M5416 Radiculopathy, lumbar region: Secondary | ICD-10-CM

## 2020-05-30 ENCOUNTER — Ambulatory Visit
Admission: RE | Admit: 2020-05-30 | Discharge: 2020-05-30 | Disposition: A | Discharge status: Home / Self Care | Payer: Medicare PPO | Source: Ambulatory Visit | Attending: Physical Medicine and Rehabilitation | Admitting: Physical Medicine and Rehabilitation

## 2020-05-30 ENCOUNTER — Other Ambulatory Visit: Payer: Self-pay

## 2020-05-30 DIAGNOSIS — M5416 Radiculopathy, lumbar region: Secondary | ICD-10-CM | POA: Insufficient documentation

## 2020-06-23 DIAGNOSIS — M5416 Radiculopathy, lumbar region: Secondary | ICD-10-CM | POA: Insufficient documentation

## 2020-07-05 DIAGNOSIS — K2100 Gastro-esophageal reflux disease with esophagitis, without bleeding: Secondary | ICD-10-CM | POA: Insufficient documentation

## 2020-12-19 ENCOUNTER — Other Ambulatory Visit: Payer: Self-pay | Admitting: Internal Medicine

## 2020-12-19 DIAGNOSIS — R590 Localized enlarged lymph nodes: Secondary | ICD-10-CM

## 2020-12-28 ENCOUNTER — Other Ambulatory Visit: Payer: Self-pay

## 2020-12-28 ENCOUNTER — Ambulatory Visit
Admission: RE | Admit: 2020-12-28 | Discharge: 2020-12-28 | Disposition: A | Discharge status: Home / Self Care | Payer: Medicare PPO | Source: Ambulatory Visit | Attending: Internal Medicine | Admitting: Internal Medicine

## 2020-12-28 ENCOUNTER — Other Ambulatory Visit: Payer: Medicare PPO

## 2020-12-28 DIAGNOSIS — R590 Localized enlarged lymph nodes: Secondary | ICD-10-CM | POA: Insufficient documentation

## 2021-01-20 ENCOUNTER — Other Ambulatory Visit
Admission: RE | Admit: 2021-01-20 | Discharge: 2021-01-20 | Disposition: A | Discharge status: Home / Self Care | Payer: Medicare PPO | Source: Ambulatory Visit | Attending: Cardiology | Admitting: Cardiology

## 2021-01-20 DIAGNOSIS — R079 Chest pain, unspecified: Secondary | ICD-10-CM | POA: Diagnosis present

## 2021-01-20 LAB — TROPONIN I (HIGH SENSITIVITY): Troponin I (High Sensitivity): 4 ng/L (ref <18)

## 2021-04-28 ENCOUNTER — Other Ambulatory Visit (HOSPITAL_COMMUNITY): Payer: Self-pay | Admitting: Otolaryngology

## 2021-04-28 ENCOUNTER — Other Ambulatory Visit: Payer: Self-pay | Admitting: Otolaryngology

## 2021-04-28 ENCOUNTER — Other Ambulatory Visit: Payer: Self-pay | Admitting: Pediatric Hematology-Oncology

## 2021-04-28 DIAGNOSIS — R42 Dizziness and giddiness: Secondary | ICD-10-CM

## 2021-05-06 ENCOUNTER — Other Ambulatory Visit: Payer: Self-pay

## 2021-05-06 ENCOUNTER — Ambulatory Visit
Admission: RE | Admit: 2021-05-06 | Discharge: 2021-05-06 | Disposition: A | Discharge status: Home / Self Care | Payer: Medicare PPO | Source: Ambulatory Visit | Attending: Otolaryngology | Admitting: Otolaryngology

## 2021-05-06 DIAGNOSIS — R42 Dizziness and giddiness: Secondary | ICD-10-CM | POA: Diagnosis not present

## 2021-05-06 MED ORDER — GADOBUTROL 1 MMOL/ML IV SOLN
7.5000 mL | Freq: Once | INTRAVENOUS | Status: AC | PRN
  Administered 2021-05-06: 7.5 mL via INTRAVENOUS

## 2021-07-13 DIAGNOSIS — M1380 Other specified arthritis, unspecified site: Secondary | ICD-10-CM | POA: Insufficient documentation

## 2021-10-18 ENCOUNTER — Other Ambulatory Visit: Payer: Self-pay | Admitting: Otolaryngology

## 2021-10-20 LAB — SURGICAL PATHOLOGY

## 2022-11-23 DIAGNOSIS — M7521 Bicipital tendinitis, right shoulder: Secondary | ICD-10-CM | POA: Insufficient documentation

## 2022-11-23 DIAGNOSIS — M7581 Other shoulder lesions, right shoulder: Secondary | ICD-10-CM | POA: Insufficient documentation

## 2022-11-23 DIAGNOSIS — G5602 Carpal tunnel syndrome, left upper limb: Secondary | ICD-10-CM | POA: Insufficient documentation

## 2022-11-23 DIAGNOSIS — G5601 Carpal tunnel syndrome, right upper limb: Secondary | ICD-10-CM | POA: Insufficient documentation

## 2023-01-27 DIAGNOSIS — R7303 Prediabetes: Secondary | ICD-10-CM | POA: Insufficient documentation

## 2024-01-29 ENCOUNTER — Other Ambulatory Visit: Payer: Self-pay

## 2024-01-29 ENCOUNTER — Encounter: Payer: Self-pay | Admitting: Cardiology

## 2024-01-29 ENCOUNTER — Ambulatory Visit
Admission: RE | Admit: 2024-01-29 | Discharge: 2024-01-29 | Disposition: A | Discharge status: Home / Self Care | Attending: Cardiology | Admitting: Cardiology

## 2024-01-29 ENCOUNTER — Encounter
Admission: RE | Disposition: A | Discharge status: Home / Self Care | Payer: Self-pay | Source: Home / Self Care | Attending: Cardiology

## 2024-01-29 DIAGNOSIS — N1832 Chronic kidney disease, stage 3b: Secondary | ICD-10-CM | POA: Insufficient documentation

## 2024-01-29 DIAGNOSIS — I129 Hypertensive chronic kidney disease with stage 1 through stage 4 chronic kidney disease, or unspecified chronic kidney disease: Secondary | ICD-10-CM | POA: Insufficient documentation

## 2024-01-29 DIAGNOSIS — Z955 Presence of coronary angioplasty implant and graft: Secondary | ICD-10-CM | POA: Insufficient documentation

## 2024-01-29 DIAGNOSIS — I2510 Atherosclerotic heart disease of native coronary artery without angina pectoris: Secondary | ICD-10-CM | POA: Insufficient documentation

## 2024-01-29 DIAGNOSIS — R9430 Abnormal result of cardiovascular function study, unspecified: Secondary | ICD-10-CM | POA: Diagnosis present

## 2024-01-29 DIAGNOSIS — E7849 Other hyperlipidemia: Secondary | ICD-10-CM | POA: Diagnosis not present

## 2024-01-29 DIAGNOSIS — I2582 Chronic total occlusion of coronary artery: Secondary | ICD-10-CM | POA: Diagnosis not present

## 2024-01-29 HISTORY — PX: LEFT HEART CATH AND CORONARY ANGIOGRAPHY: CATH118249

## 2024-01-29 SURGERY — LEFT HEART CATH AND CORONARY ANGIOGRAPHY
Anesthesia: Moderate Sedation | Laterality: Left

## 2024-01-29 MED ORDER — SODIUM CHLORIDE 0.9% FLUSH: 3.0000 mL | Freq: Two times a day (BID) | INTRAVENOUS | Status: DC

## 2024-01-29 MED ORDER — MIDAZOLAM HCL 2 MG/2ML IJ SOLN
INTRAMUSCULAR | Status: AC
  Filled 2024-01-29: qty 2

## 2024-01-29 MED ORDER — FENTANYL CITRATE (PF) 100 MCG/2ML IJ SOLN
INTRAMUSCULAR | Status: AC
  Filled 2024-01-29: qty 2

## 2024-01-29 MED ORDER — SODIUM CHLORIDE 0.9 % WEIGHT BASED INFUSION
3.0000 mL/kg/h | INTRAVENOUS | Status: AC
  Administered 2024-01-29: 3 mL/kg/h via INTRAVENOUS

## 2024-01-29 MED ORDER — MIDAZOLAM HCL 2 MG/2ML IJ SOLN
INTRAMUSCULAR | Status: DC | PRN
Start: 2024-01-29 — End: 2024-01-29
  Administered 2024-01-29: 1 mg via INTRAVENOUS

## 2024-01-29 MED ORDER — SODIUM CHLORIDE 0.9 % IV SOLN: 250.0000 mL | INTRAVENOUS | Status: DC | PRN

## 2024-01-29 MED ORDER — SODIUM CHLORIDE 0.9% FLUSH: 3.0000 mL | INTRAVENOUS | Status: DC | PRN

## 2024-01-29 MED ORDER — HEPARIN (PORCINE) IN NACL 1000-0.9 UT/500ML-% IV SOLN
INTRAVENOUS | Status: DC | PRN
  Administered 2024-01-29: 1000 mL

## 2024-01-29 MED ORDER — HEPARIN (PORCINE) IN NACL 1000-0.9 UT/500ML-% IV SOLN
INTRAVENOUS | Status: AC
  Filled 2024-01-29: qty 1000

## 2024-01-29 MED ORDER — ACETAMINOPHEN 325 MG PO TABS: 650.0000 mg | ORAL_TABLET | ORAL | Status: DC | PRN

## 2024-01-29 MED ORDER — HEPARIN SODIUM (PORCINE) 1000 UNIT/ML IJ SOLN
INTRAMUSCULAR | Status: AC
  Filled 2024-01-29: qty 10

## 2024-01-29 MED ORDER — SODIUM CHLORIDE 0.9 % WEIGHT BASED INFUSION: 1.0000 mL/kg/h | INTRAVENOUS | Status: DC

## 2024-01-29 MED ORDER — LIDOCAINE HCL (PF) 1 % IJ SOLN
INTRAMUSCULAR | Status: DC | PRN
  Administered 2024-01-29: 15 mL

## 2024-01-29 MED ORDER — LABETALOL HCL 5 MG/ML IV SOLN: 10.0000 mg | INTRAVENOUS | Status: DC | PRN

## 2024-01-29 MED ORDER — ASPIRIN 81 MG PO CHEW: 81.0000 mg | CHEWABLE_TABLET | ORAL | Status: DC

## 2024-01-29 MED ORDER — ONDANSETRON HCL 4 MG/2ML IJ SOLN: 4.0000 mg | Freq: Four times a day (QID) | INTRAMUSCULAR | Status: DC | PRN

## 2024-01-29 MED ORDER — HYDRALAZINE HCL 20 MG/ML IJ SOLN: 10.0000 mg | INTRAMUSCULAR | Status: DC | PRN

## 2024-01-29 MED ORDER — LIDOCAINE HCL 1 % IJ SOLN
INTRAMUSCULAR | Status: AC
Start: 2024-01-29 — End: ?
  Filled 2024-01-29: qty 20

## 2024-01-29 MED ORDER — VERAPAMIL HCL 2.5 MG/ML IV SOLN
INTRAVENOUS | Status: AC
  Filled 2024-01-29: qty 2

## 2024-01-29 MED ORDER — IOHEXOL 300 MG/ML  SOLN
INTRAMUSCULAR | Status: DC | PRN
  Administered 2024-01-29: 66 mL

## 2024-01-29 MED ORDER — FENTANYL CITRATE (PF) 100 MCG/2ML IJ SOLN
INTRAMUSCULAR | Status: DC | PRN
  Administered 2024-01-29: 25 ug via INTRAVENOUS

## 2024-01-29 SURGICAL SUPPLY — 13 items
CATH INFINITI 5FR JL4 (CATHETERS) IMPLANT
CATH INFINITI JR4 5F (CATHETERS) IMPLANT
DEVICE CLOSURE MYNXGRIP 5F (Vascular Products) IMPLANT
DRAPE BRACHIAL (DRAPES) IMPLANT
GLIDESHEATH SLEND SS 6F .021 (SHEATH) IMPLANT
GUIDEWIRE INQWIRE 1.5J.035X260 (WIRE) IMPLANT
NDL PERC 18GX7CM (NEEDLE) IMPLANT
NEEDLE PERC 18GX7CM (NEEDLE) ×1 IMPLANT
PACK CARDIAC CATH (CUSTOM PROCEDURE TRAY) ×1 IMPLANT
SET ATX-X65L (MISCELLANEOUS) IMPLANT
SHEATH AVANTI 5FR X 11CM (SHEATH) IMPLANT
STATION PROTECTION PRESSURIZED (MISCELLANEOUS) IMPLANT
WIRE GUIDERIGHT .035X150 (WIRE) IMPLANT

## 2024-02-05 DIAGNOSIS — Z9889 Other specified postprocedural states: Secondary | ICD-10-CM | POA: Insufficient documentation

## 2024-06-05 DIAGNOSIS — M1712 Unilateral primary osteoarthritis, left knee: Principal | ICD-10-CM | POA: Insufficient documentation

## 2024-07-04 NOTE — Discharge Instructions (Signed)

## 2024-07-10 ENCOUNTER — Inpatient Hospital Stay
Admission: RE | Admit: 2024-07-10 | Discharge: 2024-07-10 | Disposition: A | Discharge status: Home / Self Care | Source: Ambulatory Visit

## 2024-07-10 DIAGNOSIS — M1712 Unilateral primary osteoarthritis, left knee: Secondary | ICD-10-CM

## 2024-07-10 DIAGNOSIS — Z01812 Encounter for preprocedural laboratory examination: Secondary | ICD-10-CM

## 2024-07-10 HISTORY — DX: Personal history of military service: Z91.85

## 2024-07-10 HISTORY — DX: Chronic kidney disease, stage 3 unspecified: N18.30

## 2024-07-10 HISTORY — DX: Obstructive sleep apnea (adult) (pediatric): G47.33

## 2024-07-10 HISTORY — DX: Diverticulosis of large intestine without perforation or abscess without bleeding: K57.30

## 2024-07-10 NOTE — Patient Instructions (Addendum)
 Your procedure is scheduled on: 07/22/24 - Wednesday Report to the Registration Desk on the 1st floor of the [facility]. To find out your arrival time, please call [phone] between 1PM - 3PM on: 07/21/24 - tuesday If your arrival time is 6:00 am, do not arrive before that time as the [facility] entrance doors do not open until 6:00 am.  REMEMBER: Instructions that are not followed completely may result in serious medical risk, up to and including death; or upon the discretion of your surgeon and anesthesiologist your surgery may need to be rescheduled.  Do not eat food after midnight the night before surgery.  No gum chewing or hard candies.  You may however, drink CLEAR liquids up to 2 hours before you are scheduled to arrive for your surgery. Do not drink anything within 2 hours of your scheduled arrival time.  Clear liquids include: - water  - apple juice without pulp - gatorade (not RED colors) - black coffee or tea (Do NOT add milk or creamers to the coffee or tea) Do NOT drink anything that is not on this list.  In addition, your doctor has ordered for you to drink the provided:  Ensure Pre-Surgery Clear Carbohydrate Drink  Drinking this carbohydrate drink up to two hours before surgery helps to reduce insulin resistance and improve patient outcomes. Please complete drinking 2 hours before scheduled arrival time.  One week prior to surgery: Stop Anti-inflammatories (NSAIDS) such as Advil, Aleve, Ibuprofen, Motrin, Naproxen, Naprosyn and Aspirin  based products such as Excedrin, Goody's Powder, BC Powder. You may take Tylenol  if needed for pain up until the day of surgery.  Stop ANY OVER THE COUNTER supplements until after surgery.  ON THE DAY OF SURGERY ONLY TAKE THESE MEDICATIONS WITH SIPS OF WATER:  ARIPiprazole (ABILIFY)  citalopram  (CELEXA )  hydroxychloroquine (PLAQUENIL)  isosorbide  mononitrate (IMDUR )    No Alcohol for 24 hours before or after surgery.  No  Smoking including e-cigarettes for 24 hours before surgery.  No chewable tobacco products for at least 6 hours before surgery.  No nicotine patches on the day of surgery.  Do not use any recreational drugs for at least a week (preferably 2 weeks) before your surgery.  Please be advised that the combination of cocaine and anesthesia may have negative outcomes, up to and including death. If you test positive for cocaine, your surgery will be cancelled.  On the morning of surgery brush your teeth with toothpaste and water, you may rinse your mouth with mouthwash if you wish. Do not swallow any toothpaste or mouthwash.  Use CHG Soap or wipes as directed on instruction sheet.  Do not wear jewelry, make-up, hairpins, clips or nail polish.  For welded (permanent) jewelry: bracelets, anklets, waist bands, etc.  Please have this removed prior to surgery.  If it is not removed, there is a chance that hospital personnel will need to cut it off on the day of surgery.  Do not wear lotions, powders, or perfumes.   Do not shave body hair from the neck down 48 hours before surgery.  Contact lenses, hearing aids and dentures may not be worn into surgery.  Do not bring valuables to the hospital. [facility] is not responsible for any missing/lost belongings or valuables.   Bring your C-PAP to the hospital in case you may have to spend the night.   Notify your doctor if there is any change in your medical condition (cold, fever, infection).  Wear comfortable clothing (specific  to your surgery type) to the hospital.  After surgery, you can help prevent lung complications by doing breathing exercises.  Take deep breaths and cough every 1-2 hours. Your doctor may order a device called an Incentive Spirometer to help you take deep breaths. When coughing or sneezing, hold a pillow firmly against your incision with both hands. This is called "splinting." Doing this helps protect your incision. It also  decreases belly discomfort.  If you are being admitted to the hospital overnight, leave your suitcase in the car. After surgery it may be brought to your room.  In case of increased patient census, it may be necessary for you, the patient, to continue your postoperative care in the Same Day Surgery department.  If you are being discharged the day of surgery, you will not be allowed to drive home. You will need a responsible individual to drive you home and stay with you for 24 hours after surgery.   If you are taking public transportation, you will need to have a responsible individual with you.  Please call the Pre-admissions Testing Dept. at [phone] if you have any questions about these instructions.  Surgery Visitation Policy:  Patients having surgery or a procedure may have two visitors.  Children under the age of 53 must have an adult with them who is not the patient.  Inpatient Visitation:    Visiting hours are 7 a.m. to 8 p.m. Up to four visitors are allowed at one time in a patient room. The visitors may rotate out with other people during the day.  One visitor age 28 or older may stay with the patient overnight and must be in the room by 8 p.m.   Merchandiser, retail to address health-related social needs:  [email or web address]  Pre-operative 4 CHG Bath Instructions   You can play a key role in reducing the risk of infection after surgery. Your skin needs to be as free of germs as possible. You can reduce the number of germs on your skin by washing with CHG (chlorhexidine  gluconate) soap before surgery. CHG is an antiseptic soap that kills germs and continues to kill germs even after washing.   DO NOT use if you have an allergy to chlorhexidine /CHG or antibacterial soaps. If your skin becomes reddened or irritated, stop using the CHG and notify one of our RNs at [phone].   Please shower with the CHG soap starting 4 days before surgery using  the following schedule: 10/18 - 10/21.    Please keep in mind the following:  DO NOT shave, including legs and underarms, starting the day of your first shower.   You may shave your face at any point before/day of surgery.  Place clean sheets on your bed the day you start using CHG soap. Use a clean washcloth (not used since being washed) for each shower. DO NOT sleep with pets once you start using the CHG.   CHG Shower Instructions:  If you choose to wash your hair and private area, wash first with your normal shampoo/soap.  After you use shampoo/soap, rinse your hair and body thoroughly to remove shampoo/soap residue.  Turn the water OFF and apply about 3 tablespoons (45 ml) of CHG soap to a CLEAN washcloth.  Apply CHG soap ONLY FROM YOUR NECK DOWN TO YOUR TOES (washing for 3-5 minutes)  DO NOT use CHG soap on face, private areas, open wounds, or sores.  Pay special attention to the area where your surgery is being performed.  If you are having back surgery, having someone wash your back for you may be helpful. Wait 2 minutes after CHG soap is applied, then you may rinse off the CHG soap.  Pat dry with a clean towel  Put on clean clothes/pajamas   If you choose to wear lotion, please use ONLY the CHG-compatible lotions on the back of this paper.     Additional instructions for the day of surgery: DO NOT APPLY any lotions, deodorants, cologne, or perfumes.   Put on clean/comfortable clothes.  Brush your teeth.  Ask your nurse before applying any prescription medications to the skin.      CHG Compatible Lotions   Aveeno Moisturizing lotion  Cetaphil Moisturizing Cream  Cetaphil Moisturizing Lotion  Clairol Herbal Essence Moisturizing Lotion, Dry Skin  Clairol Herbal Essence Moisturizing Lotion, Extra Dry Skin  Clairol Herbal Essence Moisturizing Lotion, Normal Skin  Curel Age Defying Therapeutic Moisturizing Lotion with Alpha Hydroxy  Curel Extreme Care Body Lotion  Curel  Soothing Hands Moisturizing Hand Lotion  Curel Therapeutic Moisturizing Cream, Fragrance-Free  Curel Therapeutic Moisturizing Lotion, Fragrance-Free  Curel Therapeutic Moisturizing Lotion, Original Formula  Eucerin Daily Replenishing Lotion  Eucerin Dry Skin Therapy Plus Alpha Hydroxy Crme  Eucerin Dry Skin Therapy Plus Alpha Hydroxy Lotion  Eucerin Original Crme  Eucerin Original Lotion  Eucerin Plus Crme Eucerin Plus Lotion  Eucerin TriLipid Replenishing Lotion  Keri Anti-Bacterial Hand Lotion  Keri Deep Conditioning Original Lotion Dry Skin Formula Softly Scented  Keri Deep Conditioning Original Lotion, Fragrance Free Sensitive Skin Formula  Keri Lotion Fast Absorbing Fragrance Free Sensitive Skin Formula  Keri Lotion Fast Absorbing Softly Scented Dry Skin Formula  Keri Original Lotion  Keri Skin Renewal Lotion Keri Silky Smooth Lotion  Keri Silky Smooth Sensitive Skin Lotion  Nivea Body Creamy Conditioning Oil  Nivea Body Extra Enriched Lotion  Nivea Body Original Lotion  Nivea Body Sheer Moisturizing Lotion Nivea Crme  Nivea Skin Firming Lotion  NutraDerm 30 Skin Lotion  NutraDerm Skin Lotion  NutraDerm Therapeutic Skin Cream  NutraDerm Therapeutic Skin Lotion  ProShield Protective Hand Cream  Provon moisturizing lotion  How to Use an Incentive Spirometer  An incentive spirometer is a tool that measures how well you are filling your lungs with each breath. Learning to take long, deep breaths using this tool can help you keep your lungs clear and active. This may help to reverse or lessen your chance of developing breathing (pulmonary) problems, especially infection. You may be asked to use a spirometer: After a surgery. If you have a lung problem or a history of smoking. After a long period of time when you have been unable to move or be active. If the spirometer includes an indicator to show the highest number that you have reached, your health care provider or  respiratory therapist will help you set a goal. Keep a log of your progress as told by your health care provider. What are the risks? Breathing too quickly may cause dizziness or cause you to pass out. Take your time so you do not get dizzy or light-headed. If you are in pain, you may need to take pain medicine before doing incentive spirometry. It is harder to take a deep breath if you are having pain. How to use your incentive spirometer  Sit up on the edge of your bed or on a chair. Hold the incentive spirometer so that it is in an upright position. Before you use the spirometer, breathe out normally. Place the  mouthpiece in your mouth. Make sure your lips are closed tightly around it. Breathe in slowly and as deeply as you can through your mouth, causing the piston or the ball to rise toward the top of the chamber. Hold your breath for 3-5 seconds, or for as long as possible. If the spirometer includes a coach indicator, use this to guide you in breathing. Slow down your breathing if the indicator goes above the marked areas. Remove the mouthpiece from your mouth and breathe out normally. The piston or ball will return to the bottom of the chamber. Rest for a few seconds, then repeat the steps 10 or more times. Take your time and take a few normal breaths between deep breaths so that you do not get dizzy or light-headed. Do this every 1-2 hours when you are awake. If the spirometer includes a goal marker to show the highest number you have reached (best effort), use this as a goal to work toward during each repetition. After each set of 10 deep breaths, cough a few times. This will help to make sure that your lungs are clear. If you have an incision on your chest or abdomen from surgery, place a pillow or a rolled-up towel firmly against the incision when you cough. This can help to reduce pain while taking deep breaths and coughing. General tips When you are able to get out of bed: Walk  around often. Continue to take deep breaths and cough in order to clear your lungs. Keep using the incentive spirometer until your health care provider says it is okay to stop using it. If you have been in the hospital, you may be told to keep using the spirometer at home. Contact a health care provider if: You are having difficulty using the spirometer. You have trouble using the spirometer as often as instructed. Your pain medicine is not giving enough relief for you to use the spirometer as told. You have a fever. Get help right away if: You develop shortness of breath. You develop a cough with bloody mucus from the lungs. You have fluid or blood coming from an incision site after you cough. Summary An incentive spirometer is a tool that can help you learn to take long, deep breaths to keep your lungs clear and active. You may be asked to use a spirometer after a surgery, if you have a lung problem or a history of smoking, or if you have been inactive for a long period of time. Use your incentive spirometer as instructed every 1-2 hours while you are awake. If you have an incision on your chest or abdomen, place a pillow or a rolled-up towel firmly against your incision when you cough. This will help to reduce pain. Get help right away if you have shortness of breath, you cough up bloody mucus, or blood comes from your incision when you cough. This information is not intended to replace advice given to you by your health care provider. Make sure you discuss any questions you have with your health care provider. Document Revised: 12/07/2019 Document Reviewed: 12/07/2019 Elsevier Patient Education  2023 Elsevier Inc.  POLAR CARE INFORMATION  MassAdvertisement.it  How to use [facility] Therapy System?  YouTube   ShippingScam.co.uk  OPERATING INSTRUCTIONS  Start the product With dry hands, connect the transformer to the electrical connection located  on the top of the cooler. Next, plug the transformer into an appropriate electrical outlet. The unit will automatically start running at  this point.  To stop the pump, disconnect electrical power.  Unplug to stop the product when not in use. Unplugging the Polar Care unit turns it off. Always unplug immediately after use. Never leave it plugged in while unattended. Remove pad.    FIRST ADD WATER TO FILL LINE, THEN ICE---Replace ice when existing ice is almost melted  1 Discuss Treatment with your Licensed Health Care Practitioner and Use Only as Prescribed 2 Apply Insulation Barrier & Cold Therapy Pad 3 Check for Moisture 4 Inspect Skin Regularly  Tips and Trouble Shooting Usage Tips 1. Use cubed or chunked ice for optimal performance. 2. It is recommended to drain the Pad between uses. To drain the pad, hold the Pad upright with the hose pointed toward the ground. Depress the black plunger and allow water to drain out. 3. You may disconnect the Pad from the unit without removing the pad from the affected area by depressing the silver tabs on the hose coupling and gently pulling the hoses apart. The Pad and unit will seal itself and will not leak. Note: Some dripping during release is normal. 4. DO NOT RUN PUMP WITHOUT WATER! The pump in this unit is designed to run with water. Running the unit without water will cause permanent damage to the pump. 5. Unplug unit before removing lid.  TROUBLESHOOTING GUIDE Pump not running, Water not flowing to the pad, Pad is not getting cold 1. Make sure the transformer is plugged into the wall outlet. 2. Confirm that the ice and water are filled to the indicated levels. 3. Make sure there are no kinks in the pad. 4. Gently pull on the blue tube to make sure the tube/pad junction is straight. 5. Remove the pad from the treatment site and ll it while the pad is lying at; then reapply. 6. Confirm that the pad couplings are securely attached to the unit.  Listen for the double clicks (Figure 1) to confirm the pad couplings are securely attached.  Leaks    Note: Some condensation on the lines, controller, and pads is unavoidable, especially in warmer climates. 1. If using a [facility] Polar Care Cold Therapy unit with a detachable Cold Therapy Pad, and a leak exists (other than condensation on the lines) disconnect the pad couplings. Make sure the silver tabs on the couplings are depressed before reconnecting the pad to the pump hose; then confirm both sides of the coupling are properly clicked in. 2. If the coupling continues to leak or a leak is detected in the pad itself, stop using it and call [employer] Customer Care at [phone].  Cleaning After use, empty and dry the unit with a soft cloth. Warm water and mild detergent may be used occasionally to clean the pump and tubes.  WARNING: The Polar Care Cube can be cold enough to cause serious injury, including full skin necrosis. Follow these Operating Instructions, and carefully read the Product Insert (see pouch on side of unit) and the Cold Therapy Pad Fitting Instructions (provided with each Cold Therapy Pad) prior to use.                 Class Available on 10/15 :

## 2024-07-14 ENCOUNTER — Other Ambulatory Visit: Payer: Self-pay

## 2024-07-14 ENCOUNTER — Encounter
Admission: RE | Admit: 2024-07-14 | Discharge: 2024-07-14 | Disposition: A | Discharge status: Home / Self Care | Source: Ambulatory Visit | Attending: Orthopedic Surgery | Admitting: Orthopedic Surgery

## 2024-07-14 VITALS — BP 123/72 | HR 63 | Temp 97.5°F | Resp 18 | Ht 70.0 in | Wt 176.2 lb

## 2024-07-14 DIAGNOSIS — M1712 Unilateral primary osteoarthritis, left knee: Secondary | ICD-10-CM | POA: Insufficient documentation

## 2024-07-14 DIAGNOSIS — Z01812 Encounter for preprocedural laboratory examination: Secondary | ICD-10-CM | POA: Diagnosis present

## 2024-07-14 DIAGNOSIS — Z01818 Encounter for other preprocedural examination: Secondary | ICD-10-CM | POA: Diagnosis not present

## 2024-07-14 DIAGNOSIS — I200 Unstable angina: Secondary | ICD-10-CM | POA: Insufficient documentation

## 2024-07-14 DIAGNOSIS — R7303 Prediabetes: Secondary | ICD-10-CM | POA: Insufficient documentation

## 2024-07-14 DIAGNOSIS — Z01810 Encounter for preprocedural cardiovascular examination: Secondary | ICD-10-CM | POA: Diagnosis present

## 2024-07-14 LAB — URINALYSIS, ROUTINE W REFLEX MICROSCOPIC
Bilirubin Urine: NEGATIVE
Glucose, UA: NEGATIVE mg/dL
Hgb urine dipstick: NEGATIVE
Ketones, ur: NEGATIVE mg/dL
Leukocytes,Ua: NEGATIVE
Nitrite: NEGATIVE
Protein, ur: NEGATIVE mg/dL
Specific Gravity, Urine: 1.019 (ref 1.005–1.030)
pH: 5 (ref 5.0–8.0)

## 2024-07-14 LAB — C-REACTIVE PROTEIN: CRP: <0.5 mg/dL (ref <1.0)

## 2024-07-14 LAB — COMPREHENSIVE METABOLIC PANEL WITH GFR
ALT: 15 U/L (ref 0–44)
AST: 20 U/L (ref 15–41)
Albumin: 3.9 g/dL (ref 3.5–5.0)
Alkaline Phosphatase: 116 U/L (ref 38–126)
Anion gap: 12 (ref 5–15)
BUN: 34 mg/dL — ABNORMAL HIGH (ref 8–23)
CO2: 27 mmol/L (ref 22–32)
Calcium: 9.2 mg/dL (ref 8.9–10.3)
Chloride: 101 mmol/L (ref 98–111)
Creatinine, Ser: 2.01 mg/dL — ABNORMAL HIGH (ref 0.61–1.24)
GFR, Estimated: 34 mL/min — ABNORMAL LOW (ref >60)
Glucose, Bld: 89 mg/dL (ref 70–99)
Potassium: 3.2 mmol/L — ABNORMAL LOW (ref 3.5–5.1)
Sodium: 140 mmol/L (ref 135–145)
Total Bilirubin: 1.1 mg/dL (ref 0.0–1.2)
Total Protein: 7.3 g/dL (ref 6.5–8.1)

## 2024-07-14 LAB — CBC
HCT: 42.1 % (ref 39.0–52.0)
Hemoglobin: 13.9 g/dL (ref 13.0–17.0)
MCH: 29.8 pg (ref 26.0–34.0)
MCHC: 33 g/dL (ref 30.0–36.0)
MCV: 90.1 fL (ref 80.0–100.0)
Platelets: 255 K/uL (ref 150–400)
RBC: 4.67 MIL/uL (ref 4.22–5.81)
RDW: 11.8 % (ref 11.5–15.5)
WBC: 5.6 K/uL (ref 4.0–10.5)
nRBC: 0 % (ref 0.0–0.2)

## 2024-07-14 LAB — SURGICAL PCR SCREEN
MRSA, PCR: NEGATIVE
Staphylococcus aureus: NEGATIVE

## 2024-07-14 LAB — HEMOGLOBIN A1C
Hgb A1c MFr Bld: 5.3 % (ref 4.8–5.6)
Mean Plasma Glucose: 105.41 mg/dL

## 2024-07-14 LAB — SEDIMENTATION RATE: Sed Rate: 14 mm/h (ref 0–16)

## 2024-07-14 NOTE — Patient Instructions (Signed)
 Your procedure is scheduled on: Wednesday 07/22/24 Report to the Registration Desk on the 1st floor of [facility]. To find out your arrival time, please call [phone] between 1PM - 3PM on: Tuesday 07/21/24 If your arrival time is 6:00 am, do not arrive before that time as the [facility] entrance doors do not open until 6:00 am.  REMEMBER: Instructions that are not followed completely may result in serious medical risk, up to and including death; or upon the discretion of your surgeon and anesthesiologist your surgery may need to be rescheduled.  Do not eat food after midnight the night before surgery.  No gum chewing or hard candies.  You may however, drink CLEAR liquids up to 2 hours before you are scheduled to arrive for your surgery. Do not drink anything within 2 hours of your scheduled arrival time.  Clear liquids include: - water  - apple juice without pulp - gatorade (not RED colors) - black coffee or tea (Do NOT add milk or creamers to the coffee or tea) Do NOT drink anything that is not on this list.  In addition, your doctor has ordered for you to drink the provided:  Ensure Pre-Surgery Clear Carbohydrate Drink  Drinking this carbohydrate drink up to two hours before surgery helps to reduce insulin resistance and improve patient outcomes. Please complete drinking 2 hours before scheduled arrival time.  One week prior to surgery: Stop Anti-inflammatories (NSAIDS) such as Advil, Aleve, Ibuprofen, Motrin, Naproxen, Naprosyn and Aspirin  based products such as Excedrin, Goody's Powder, BC Powder. Stop ANY OVER THE COUNTER supplements until after surgery.  You may however, continue to take Tylenol  if needed for pain up until the day of surgery.    Continue taking all of your other prescription medications up until the day of surgery.  ON THE DAY OF SURGERY ONLY TAKE THESE MEDICATIONS WITH SIPS OF WATER:  ARIPiprazole (ABILIFY) 5 MG  isosorbide  mononitrate  (IMDUR ) 30 MG 25 MG  citalopram  (CELEXA ) 20 MG  hydroxychloroquine (PLAQUENIL) 200 MG    No Alcohol for 24 hours before or after surgery.  No Smoking including e-cigarettes for 24 hours before surgery.  No chewable tobacco products for at least 6 hours before surgery.  No nicotine patches on the day of surgery.  Do not use any recreational drugs for at least a week (preferably 2 weeks) before your surgery.  Please be advised that the combination of cocaine and anesthesia may have negative outcomes, up to and including death. If you test positive for cocaine, your surgery will be cancelled.  On the morning of surgery brush your teeth with toothpaste and water, you may rinse your mouth with mouthwash if you wish. Do not swallow any toothpaste or mouthwash.  Use CHG Soap or wipes as directed on instruction sheet.  Do not wear jewelry, make-up, hairpins, clips or nail polish.  For welded (permanent) jewelry: bracelets, anklets, waist bands, etc.  Please have this removed prior to surgery.  If it is not removed, there is a chance that hospital personnel will need to cut it off on the day of surgery.  Do not wear lotions, powders, or perfumes.   Do not shave body hair from the neck down 48 hours before surgery.  Contact lenses, hearing aids and dentures may not be worn into surgery.  Do not bring valuables to the hospital. [facility] is not responsible for any missing/lost belongings or valuables.   Total Shoulder Arthroplasty:  use Benzoyl Peroxide 5% Gel  as directed on instruction sheet.  Bring your C-PAP to the hospital in case you may have to spend the night.   Notify your doctor if there is any change in your medical condition (cold, fever, infection).  Wear comfortable clothing (specific to your surgery type) to the hospital.  After surgery, you can help prevent lung complications by doing breathing exercises.  Take deep breaths and cough every 1-2 hours. Your doctor may  order a device called an Incentive Spirometer to help you take deep breaths. When coughing or sneezing, hold a pillow firmly against your incision with both hands. This is called "splinting." Doing this helps protect your incision. It also decreases belly discomfort.  If you are being admitted to the hospital overnight, leave your suitcase in the car. After surgery it may be brought to your room.  In case of increased patient census, it may be necessary for you, the patient, to continue your postoperative care in the Same Day Surgery department.  If you are being discharged the day of surgery, you will not be allowed to drive home. You will need a responsible individual to drive you home and stay with you for 24 hours after surgery.   If you are taking public transportation, you will need to have a responsible individual with you.  Please call the Pre-admissions Testing Dept. at [phone] if you have any questions about these instructions.  Surgery Visitation Policy:  Patients having surgery or a procedure may have two visitors.  Children under the age of 22 must have an adult with them who is not the patient.  Inpatient Visitation:    Visiting hours are 7 a.m. to 8 p.m. Up to four visitors are allowed at one time in a patient room. The visitors may rotate out with other people during the day.  One visitor age 44 or older may stay with the patient overnight and must be in the room by 8 p.m.   Merchandiser, retail to address health-related social needs:  [email or web address]  Pre-operative 4 CHG Bath Instructions   You can play a key role in reducing the risk of infection after surgery. Your skin needs to be as free of germs as possible. You can reduce the number of germs on your skin by washing with CHG (chlorhexidine  gluconate) soap before surgery. CHG is an antiseptic soap that kills germs and continues to kill germs even after washing.   DO NOT use if you  have an allergy to chlorhexidine /CHG or antibacterial soaps. If your skin becomes reddened or irritated, stop using the CHG and notify one of our RNs at [phone].   Please shower with the CHG soap starting 4 days before surgery using the following schedule:     Please keep in mind the following:  DO NOT shave, including legs and underarms, starting the day of your first shower.   You may shave your face at any point before/day of surgery.  Place clean sheets on your bed the day you start using CHG soap. Use a clean washcloth (not used since being washed) for each shower. DO NOT sleep with pets once you start using the CHG.   CHG Shower Instructions:  If you choose to wash your hair and private area, wash first with your normal shampoo/soap.  After you use shampoo/soap, rinse your hair and body thoroughly to remove shampoo/soap residue.  Turn the water OFF and apply about 3 tablespoons (45 ml) of CHG soap to a CLEAN washcloth.  Apply  CHG soap ONLY FROM YOUR NECK DOWN TO YOUR TOES (washing for 3-5 minutes)  DO NOT use CHG soap on face, private areas, open wounds, or sores.  Pay special attention to the area where your surgery is being performed.  If you are having back surgery, having someone wash your back for you may be helpful. Wait 2 minutes after CHG soap is applied, then you may rinse off the CHG soap.  Pat dry with a clean towel  Put on clean clothes/pajamas   If you choose to wear lotion, please use ONLY the CHG-compatible lotions on the back of this paper.     Additional instructions for the day of surgery: DO NOT APPLY any lotions, deodorants, cologne, or perfumes.   Put on clean/comfortable clothes.  Brush your teeth.  Ask your nurse before applying any prescription medications to the skin.      CHG Compatible Lotions   Aveeno Moisturizing lotion  Cetaphil Moisturizing Cream  Cetaphil Moisturizing Lotion  Clairol Herbal Essence Moisturizing Lotion, Dry Skin   Clairol Herbal Essence Moisturizing Lotion, Extra Dry Skin  Clairol Herbal Essence Moisturizing Lotion, Normal Skin  Curel Age Defying Therapeutic Moisturizing Lotion with Alpha Hydroxy  Curel Extreme Care Body Lotion  Curel Soothing Hands Moisturizing Hand Lotion  Curel Therapeutic Moisturizing Cream, Fragrance-Free  Curel Therapeutic Moisturizing Lotion, Fragrance-Free  Curel Therapeutic Moisturizing Lotion, Original Formula  Eucerin Daily Replenishing Lotion  Eucerin Dry Skin Therapy Plus Alpha Hydroxy Crme  Eucerin Dry Skin Therapy Plus Alpha Hydroxy Lotion  Eucerin Original Crme  Eucerin Original Lotion  Eucerin Plus Crme Eucerin Plus Lotion  Eucerin TriLipid Replenishing Lotion  Keri Anti-Bacterial Hand Lotion  Keri Deep Conditioning Original Lotion Dry Skin Formula Softly Scented  Keri Deep Conditioning Original Lotion, Fragrance Free Sensitive Skin Formula  Keri Lotion Fast Absorbing Fragrance Free Sensitive Skin Formula  Keri Lotion Fast Absorbing Softly Scented Dry Skin Formula  Keri Original Lotion  Keri Skin Renewal Lotion Keri Silky Smooth Lotion  Keri Silky Smooth Sensitive Skin Lotion  Nivea Body Creamy Conditioning Oil  Nivea Body Extra Enriched Lotion  Nivea Body Original Lotion  Nivea Body Sheer Moisturizing Lotion Nivea Crme  Nivea Skin Firming Lotion  NutraDerm 30 Skin Lotion  NutraDerm Skin Lotion  NutraDerm Therapeutic Skin Cream  NutraDerm Therapeutic Skin Lotion  ProShield Protective Hand Cream  Provon moisturizing lotion  How to Use an Incentive Spirometer  An incentive spirometer is a tool that measures how well you are filling your lungs with each breath. Learning to take long, deep breaths using this tool can help you keep your lungs clear and active. This may help to reverse or lessen your chance of developing breathing (pulmonary) problems, especially infection. You may be asked to use a spirometer: After a surgery. If you have a lung problem  or a history of smoking. After a long period of time when you have been unable to move or be active. If the spirometer includes an indicator to show the highest number that you have reached, your health care provider or respiratory therapist will help you set a goal. Keep a log of your progress as told by your health care provider. What are the risks? Breathing too quickly may cause dizziness or cause you to pass out. Take your time so you do not get dizzy or light-headed. If you are in pain, you may need to take pain medicine before doing incentive spirometry. It is harder to take a deep breath if you are having  pain. How to use your incentive spirometer  Sit up on the edge of your bed or on a chair. Hold the incentive spirometer so that it is in an upright position. Before you use the spirometer, breathe out normally. Place the mouthpiece in your mouth. Make sure your lips are closed tightly around it. Breathe in slowly and as deeply as you can through your mouth, causing the piston or the ball to rise toward the top of the chamber. Hold your breath for 3-5 seconds, or for as long as possible. If the spirometer includes a coach indicator, use this to guide you in breathing. Slow down your breathing if the indicator goes above the marked areas. Remove the mouthpiece from your mouth and breathe out normally. The piston or ball will return to the bottom of the chamber. Rest for a few seconds, then repeat the steps 10 or more times. Take your time and take a few normal breaths between deep breaths so that you do not get dizzy or light-headed. Do this every 1-2 hours when you are awake. If the spirometer includes a goal marker to show the highest number you have reached (best effort), use this as a goal to work toward during each repetition. After each set of 10 deep breaths, cough a few times. This will help to make sure that your lungs are clear. If you have an incision on your chest or abdomen  from surgery, place a pillow or a rolled-up towel firmly against the incision when you cough. This can help to reduce pain while taking deep breaths and coughing. General tips When you are able to get out of bed: Walk around often. Continue to take deep breaths and cough in order to clear your lungs. Keep using the incentive spirometer until your health care provider says it is okay to stop using it. If you have been in the hospital, you may be told to keep using the spirometer at home. Contact a health care provider if: You are having difficulty using the spirometer. You have trouble using the spirometer as often as instructed. Your pain medicine is not giving enough relief for you to use the spirometer as told. You have a fever. Get help right away if: You develop shortness of breath. You develop a cough with bloody mucus from the lungs. You have fluid or blood coming from an incision site after you cough. Summary An incentive spirometer is a tool that can help you learn to take long, deep breaths to keep your lungs clear and active. You may be asked to use a spirometer after a surgery, if you have a lung problem or a history of smoking, or if you have been inactive for a long period of time. Use your incentive spirometer as instructed every 1-2 hours while you are awake. If you have an incision on your chest or abdomen, place a pillow or a rolled-up towel firmly against your incision when you cough. This will help to reduce pain. Get help right away if you have shortness of breath, you cough up bloody mucus, or blood comes from your incision when you cough. This information is not intended to replace advice given to you by your health care provider. Make sure you discuss any questions you have with your health care provider. Document Revised: 12/07/2019 Document Reviewed: 12/07/2019 Elsevier Patient Education  2023 [employer].    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  [email or web address]

## 2024-07-17 ENCOUNTER — Encounter: Payer: Self-pay | Admitting: Orthopedic Surgery

## 2024-07-17 NOTE — Progress Notes (Signed)
 Perioperative / Anesthesia Services  Pre-Admission Testing Clinical Review / Pre-Operative Anesthesia Consult  Date: 07/17/24  PATIENT DEMOGRAPHICS: Name: Tom Goodman DOB: 1951/04/12 MRN:   [id number]  Note: Available PAT nursing documentation and vital signs have been reviewed. Clinical nursing staff has updated patient's PMH/PSHx, current medication list, and drug allergies/intolerances to ensure complete and comprehensive history available to assist care teams in MDM as it pertains to the aforementioned surgical procedure and anticipated anesthetic course. Extensive review of available clinical information personally performed. Nursing documentation reviewed. [place] PMH and PSHx updated with any diagnoses and/or procedures that I have knowledge of that may have been inadvertently omitted during his intake with the pre-admission testing department's nursing staff.  PLANNED SURGICAL PROCEDURE(S):   Case: [id number] Date/Time: 07/22/24 1125   Procedure: ARTHROPLASTY, KNEE, TOTAL, USING IMAGELESS COMPUTER-ASSISTED NAVIGATION (Left: Knee)   Anesthesia type: Choice   Diagnosis: Primary osteoarthritis of left knee [M17.12]   Pre-op diagnosis: Primary osteoarthritis of left knee   Location: [facility] OR ROOM 01 / [facility]   Surgeons: Mardee Lynwood SQUIBB, MD        CLINICAL DISCUSSION: Tom Goodman is a 73 y.o. male who is submitted for pre-surgical anesthesia review and clearance prior to him undergoing the above procedure. Patient has never been a smoker in the past. 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  Patient is followed by cardiology Andrea, MD). He was last seen in the cardiology clinic on 06/09/2024; notes reviewed. At the time of  his clinic visit, patient reporting a several week history of chest pressure when in the supine position.  Patient noted improvement with head elevation or repositioning to his side.  Additionally, patient with progressive exertional dyspnea that was new.  Patient had been started on furosemide by his PCP with noted improvement. Patient denied any PND, palpitations, significant peripheral edema, weakness, fatigue, vertiginous symptoms, or presyncope/syncope. Patient with a past medical history significant for cardiovascular diagnoses. Documented physical exam was grossly benign, providing no evidence of acute exacerbation and/or decompensation of the patient's known cardiovascular conditions.  Of note, complete records regarding patient's cardiovascular history unavailable for review at time of consult.  Information gathered from patient report and from notes provided by his local specialty care providers.  Patient underwent diagnostic LEFT heart catheterization on 03/09/2009.  The study revealed disease in the OM and posterior lateral branches.  PCI was performed placing a DES (unknown type) to the OM1.  Procedure yielded excellent angiographic result and TIMI-3 flow.  Due to renal function, the decision was made to make this a staged procedure with interventions to the PLB at a later date.  Patient underwent staged PCI procedure on 03/22/2009.  DES (unknown type) was placed to the PLB.  Procedure yielded excellent angiographic result and TIMI-3 flow.  Patient underwent repeat diagnostic LEFT heart catheterization on 11/23/2013.  Study revealed multivessel CAD: 20% proximal LAD, 50% ostial diagonal, 99% ostial septal perforator, 60% proximal LCx, 100% proximal LCx at the edge of the previously placed stent, and 50-60% mid RCA.  Proximal LCx lesion with adequate RIGHT to LEFT collateral formation.  Interventional cardiology made the decision to defer further intervention at that time opting for aggressive  secondary prevention through medical management.  Patient with a history of palpitations.  He underwent placement of an  implanted ILR on 12/13/2016.  Most recent long-term cardiac event monitor study was performed in 01/2021 revealing a predominant underlying sinus rhythm at a mean rate of 69 bpm.  There was occasional atrial ectopy and infrequent ventricular ectopy.  8 atrial runs with the longest lasting 29 beats at a maximum rate of 111 bpm was observed.  There were no sustained arrhythmias or prolonged pauses.  Patient remained asymptomatic despite noted events.  Most recent TTE performed on 12/31/2023 revealed a low normal left ventricular systolic function with an EF of 50%. There was no LVH.  There were no regional wall motion abnormalities. Left ventricular diastolic Doppler parameters consistent with abnormal relaxation (G1DD).  GLS -16.6 (normal range <-18%).  Right ventricular size and function normal with a TAPSE measuring 2.1 cm  (normal range >/= 1.6 cm).  RVSP = 35 mmHg.  There was mild aortic, mild mitral, trivial pulmonary, and mild tricuspid valve regurgitation.  All transvalvular gradients were noted to be normal providing no evidence of hemodynamically significant valvular stenosis. Aorta normal in size with no evidence of ectasia or aneurysmal dilatation.  Most recent myocardial perfusion imaging study was performed on 12/31/2023 revealing a  normal left ventricular systolic function with an EF of 64%.  There were no regional wall motion abnormalities.  No left ventricular cavity size enlargement appreciated on review of imaging.  Diaphragmatic attenuation artifact was present.  SPECT images demonstrated a medium in size, mild to moderate intensity, predominantly reversible defect in the basal to apical inferior and apical lateral wall suggestive of ischemia in the RCA territory.  Sum difference score = 5.  There was no  scintigraphic evidence of scar.  TID ratio = 0.92 (normal range </=  1.2). Study determined to be intermediate risk.  Patient underwent diagnostic LEFT heart catheterization on 01/29/2024 revealing a low normal left ventricular systolic function with an EF of 50-55%.  LVEDP was normal.  There was multivessel CAD: 100% OM1, 30% mid LAD, 30% mid RCA, and 75% RV branch.  Previously placed stent and RPL-3 noted to be widely patent.  Cardiology made the decision to again defer further intervention at that time opting for aggressive secondary prevention through medical management.  Blood pressure well controlled at 110/70 mmHg on currently prescribed diuretic (chlorthalidone + furosemide) and nitrate (isosorbide  mononitrate) therapies.  In addition to his scheduled nitrate therapy, patient has a supply of short acting nitrates (NTG) to use on a as needed basis for recurrent angina/anginal equivalent symptoms; denied recent use.  Patient is on atorvastatin  for his HLD diagnosis and ASCVD prevention. Patient has a prediabetes diagnosis that heis managing with diet and lifestyle modification. hislast hemoglobin A1c was 5.6% when checked on 03/20/2024.  Patient does have an OSAH diagnosis, however he does not require the use of nocturnal PAP therapy. Patient is able to complete all of his  ADL/IADLs without cardiovascular limitation.  Per the DASI, patient is able to achieve at least 4 METS of physical activity without experiencing any significant degree of angina/anginal equivalent symptoms. No changes were made to his medication regimen during his visit with cardiology.  Patient scheduled to follow-up with outpatient cardiology in 4 months or sooner if needed.  Tom Goodman is scheduled for an elective ARTHROPLASTY, KNEE, TOTAL, USING IMAGELESS COMPUTER-ASSISTED NAVIGATION (Left: Knee) on 07/22/2024 with Dr. Lynwood SHAUNNA Hue, MD. Given patient's past medical history significant for cardiovascular diagnoses, presurgical cardiac clearance was sought by the PAT team. Per cardiology, this  patient is optimized for surgery  and may proceed with the planned procedural course with a LOW risk of significant perioperative cardiovascular complications.  In review of the patient's medication reconciliation, it is noted that he is on daily oral antithrombotic therapy. Given that patient's past medical history is significant for cardiovascular diagnoses, including but not limited to CAD, orthopedics has cleared patient to continue his daily low dose ASA throughout his perioperative course.  Patient has been updated on these directives from his specialty care providers by the PAT team.  Patient denies previous perioperative complications with anesthesia in the past. In review his EMR, it is noted that patient underwent a general anesthetic course at [facility] (ASA III) in 07/2020 without documented complications.   MOST RECENT VITAL SIGNS:    07/14/2024   10:11 AM 01/29/2024    9:45 AM 01/29/2024    9:30 AM  Vitals with BMI  Height 5' 10    Weight 176 lbs 3 oz    BMI 25.28    Systolic 123 130 864  Diastolic 72 59 67  Pulse 63 89    PROVIDERS/SPECIALISTS: NOTE: Primary physician provider listed below. Patient may have been seen by APP or partner within same practice.   PROVIDER ROLE / SPECIALTY LAST OV  Hooten, Lynwood SQUIBB, MD Orthopedics (Surgeon) 06/02/2024  Fernande Ophelia JINNY DOUGLAS, MD Primary Care Provider 03/27/2024  Ammon Blunt, MD Cardiology 06/09/2024   ALLERGIES: Allergies  Allergen Reactions   Corticosteroids Swelling and Other (See Comments)    Swelling of lips   Prednisone  Swelling and Other (See Comments)    Swollen lips    CURRENT HOME MEDICATIONS: No current facility-administered medications for this encounter.    ARIPiprazole (ABILIFY) 5 MG tablet   aspirin  81 MG tablet   atorvastatin  (LIPITOR) 40 MG tablet   chlorthalidone (HYGROTON) 25 MG tablet   citalopram  (CELEXA ) 20 MG tablet   cyanocobalamin (VITAMIN B12) 500 MCG tablet    furosemide (LASIX) 20 MG tablet   gabapentin  (NEURONTIN ) 800 MG tablet   HYDROcodone -acetaminophen  (NORCO) 10-325 MG tablet   hydroxychloroquine (PLAQUENIL) 200 MG tablet   isosorbide  mononitrate (IMDUR ) 30 MG 24 hr tablet   loperamide  (IMODIUM ) 2 MG capsule   nitroGLYCERIN  (NITROSTAT ) 0.4 MG SL tablet   Probiotic Product (TRUNATURE DIGESTIVE PROBIOTIC) CAPS   tamsulosin (FLOMAX) 0.4 MG CAPS capsule   ELDERBERRY PO   HISTORY: Past Medical History:  Diagnosis Date   Benign heart murmur 1970   BPH (benign prostatic hyperplasia)    Cataracts, bilateral    Chest pain    CKD (chronic kidney disease), stage III (HCC)    Coronary atherosclerosis of native coronary artery 03/09/2009   a.) PCI 06/09/2009: OM1 (DES; unk type); b.) staged PCI 03/22/2009: PLB (DES; unk type)   DDD (degenerative disc disease), lumbar    a.) L4-S1 MIS decompression and discectomy 07/05/2020   Depression    Diarrhea, functional    Diastolic dysfunction    Diverticulosis of colon    Essential hypertension, benign    GERD (gastroesophageal reflux disease)    History of bilateral cataract extraction    Hypercholesteremia    Implantable loop recorder present 12/13/2016   Inflammatory arthritis    a.) on DMARD therapy (hydroxychloroquine)   Kidney stones    Long-term current use of immunomodulator    a.) on DMARD therapy (hydroxychloroquine) for inflammatory arthritis   Long-term use of aspirin  therapy    Mixed hyperlipidemia    OSA (obstructive sleep apnea)    a.) no nocturnal  PAP therapy   Osteoarthritis    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[employer])    Prediabetes    Past Surgical History:  Procedure Laterality Date   Bilateral thumb surgery Dr Sissy     CATARACT EXTRACTION Bilateral    COLONOSCOPY     COLONOSCOPY WITH PROPOFOL  N/A 03/20/2016   Procedure: COLONOSCOPY WITH PROPOFOL ;  Surgeon: Gladis MARLA Louder, MD;  Location: [facility];  Service: Endoscopy;  Laterality: N/A;   CORONARY  ANGIOPLASTY WITH STENT PLACEMENT Left 03/09/2009   CORONARY STENT INTERVENTION Left 03/22/2009   LEFT HEART CATH AND CORONARY ANGIOGRAPHY Left 01/29/2024   Procedure: LEFT HEART CATH AND CORONARY ANGIOGRAPHY;  Surgeon: Ammon Blunt, MD;  Location: [facility] INVASIVE CV LAB;  Service: Cardiovascular;  Laterality: Left;   LEFT HEART CATHETERIZATION WITH CORONARY ANGIOGRAM N/A 11/23/2013   Procedure: LEFT HEART CATHETERIZATION WITH CORONARY ANGIOGRAM;  Surgeon: Candyce GORMAN Reek, MD;  Location: [facility] CATH LAB;  Service: Cardiovascular;  Laterality: N/A;   LOOP RECORDER INSERTION N/A 12/13/2016   Procedure: Loop Recorder Insertion;  Surgeon: Lynwood Rakers, MD;  Location: [facility] INVASIVE CV LAB;  Service: Cardiovascular;  Laterality: N/A;   SHOULDER ARTHROSCOPY Right 06/2011   SHOULDER ARTHROSCOPY Left 11/2010   TOTAL HIP ARTHROPLASTY Left 10/25/2015   Procedure: TOTAL HIP ARTHROPLASTY ANTERIOR APPROACH;  Surgeon: Maude Herald, MD;  Location: [facility] OR;  Service: Orthopedics;  Laterality: Left;   VASECTOMY     Family History  Problem Relation Age of Onset   Dementia Mother    Stroke Mother    Emphysema Father    Hypertension Brother    CVA Sister    Stroke Sister    Heart attack Neg Hx    Social History   Tobacco Use   Smoking status: Never   Smokeless tobacco: Never  Substance Use Topics   Alcohol use: No   LABS:  Hospital Outpatient Visit on 07/14/2024  Component Date Value Ref Range Status   MRSA, PCR 07/14/2024 NEGATIVE  NEGATIVE Final   Staphylococcus aureus 07/14/2024 NEGATIVE  NEGATIVE Final   Comment: (NOTE) The Xpert SA Assay (FDA approved for NASAL specimens in patients 60 years of age and older), is one component of a comprehensive surveillance program. It is not intended to diagnose infection nor to guide or monitor treatment. Performed at [facility], [address] [place], [place] [address]    CRP 07/14/2024 <0.5  <1.0 mg/dL Final   Performed at [facility], [address], [place], [place] [address]   Sed Rate 07/14/2024 14  0 - 16 mm/hr Final   Performed at [facility], [address] [place], [place] [address]   Hgb A1c MFr Bld 07/14/2024 5.3  4.8 - 5.6 % Final   Comment: (NOTE) Diagnosis of Diabetes The following HbA1c ranges recommended by the American Diabetes Association (ADA) may be used as an aid in the diagnosis of diabetes mellitus.  Hemoglobin             Suggested A1C NGSP%              Diagnosis  <5.7                   Non Diabetic  5.7-6.4                Pre-Diabetic  >6.4                   Diabetic  <7.0  Glycemic control for                       adults with diabetes.     Mean Plasma Glucose 07/14/2024 105.41  mg/dL Final   Performed at [facility], [address], [place], [place] [address]   WBC 07/14/2024 5.6  4.0 - 10.5 K/uL Final   RBC 07/14/2024 4.67  4.22 - 5.81 MIL/uL Final   Hemoglobin 07/14/2024 13.9  13.0 - 17.0 g/dL Final   HCT 89/85/7974 42.1  39.0 - 52.0 % Final   MCV 07/14/2024 90.1  80.0 - 100.0 fL Final   MCH 07/14/2024 29.8  26.0 - 34.0 pg Final   MCHC 07/14/2024 33.0  30.0 - 36.0 g/dL Final   RDW 89/85/7974 11.8  11.5 - 15.5 % Final   Platelets 07/14/2024 255  150 - 400 K/uL Final   nRBC 07/14/2024 0.0  0.0 - 0.2 % Final   Performed at [facility], [address] [place], [place] [address]   Sodium 07/14/2024 140  135 - 145 mmol/L Final   Potassium 07/14/2024 3.2 (L)  3.5 - 5.1 mmol/L Final   Chloride 07/14/2024 101  98 - 111 mmol/L Final   CO2 07/14/2024 27  22 - 32 mmol/L Final   Glucose, Bld 07/14/2024 89  70 - 99 mg/dL Final   Glucose reference range applies only to samples taken after fasting for at least 8 hours.   BUN 07/14/2024 34 (H)  8 - 23 mg/dL Final   Creatinine, Ser 07/14/2024 2.01 (H)  0.61 - 1.24 mg/dL Final   Calcium  07/14/2024 9.2  8.9 - 10.3 mg/dL Final   Total Protein 89/85/7974 7.3  6.5 - 8.1 g/dL Final    Albumin 89/85/7974 3.9  3.5 - 5.0 g/dL Final   AST 89/85/7974 20  15 - 41 U/L Final   ALT 07/14/2024 15  0 - 44 U/L Final   Alkaline Phosphatase 07/14/2024 116  38 - 126 U/L Final   Total Bilirubin 07/14/2024 1.1  0.0 - 1.2 mg/dL Final   GFR, Estimated 07/14/2024 34 (L)  >60 mL/min Final   Comment: (NOTE) Calculated using the CKD-EPI Creatinine Equation (2021)    Anion gap 07/14/2024 12  5 - 15 Final   Performed at [facility], [address] [place], [place] [address]   Color, Urine 07/14/2024 YELLOW (A)  YELLOW Final   APPearance 07/14/2024 CLEAR (A)  CLEAR Final   Specific Gravity, Urine 07/14/2024 1.019  1.005 - 1.030 Final   pH 07/14/2024 5.0  5.0 - 8.0 Final   Glucose, UA 07/14/2024 NEGATIVE  NEGATIVE mg/dL Final   Hgb urine dipstick 07/14/2024 NEGATIVE  NEGATIVE Final   Bilirubin Urine 07/14/2024 NEGATIVE  NEGATIVE Final   Ketones, ur 07/14/2024 NEGATIVE  NEGATIVE mg/dL Final   Protein, ur 89/85/7974 NEGATIVE  NEGATIVE mg/dL Final   Nitrite 89/85/7974 NEGATIVE  NEGATIVE Final   Leukocytes,Ua 07/14/2024 NEGATIVE  NEGATIVE Final   Performed at [facility], [address] [place], [place] [address]    ECG: Date: 07/14/2024  Time ECG obtained: 1100 AM Rate: 57 bpm Rhythm: sinus bradycardia Axis (leads I and aVF): normal Intervals: PR 206 ms. QRS 96 ms. QTc 457 ms. ST segment and T wave changes: No evidence of acute T wave abnormalities or significant ST segment elevation or depression.  Evidence of a possible, age undetermined, prior infarct:  No Comparison: Similar to previous tracing obtained on 01/22/2024  IMAGING / PROCEDURES: LEFT HEART CATHETERIZATION AND CORONARY ANGIOGRAPHY performed on 01/29/2024 Low normal left ventricular systolic function with EF of 50-55% Normal LVEDP Multivessel CAD 100% OM1 30% mLAD 30% mRCA 75% RV branch Previously placed stent to RPL 3 widely patent Recommendations: Aggressive risk factor modification and  medical management   MYOCARDIAL PERFUSION IMAGING STUDY (LEXISCAN) performed on 12/31/2023 Normal left ventricular systolic function with an EF of 64% No regional wall motion abnormalities Diaphragmatic attenuation observed SPECT images demonstrate a medium sized, mild to moderate intensity, predominantly reversible defect in the basal to apical inferior and apical lateral wall which can suggest ischemia in the RCA territory.  Sum difference score = 5 Intermediate risk study  TRANSTHORACIC ECHOCARDIOGRAM performed on 12/31/2023 Low normal left ventricular systolic function with an EF of 50% No LVH No regional wall motion abnormalities Left ventricular diastolic Doppler parameters consistent with abnormal relaxation (G1DD). Normal right ventricular size and function Trivial PR Mild AR, MR, TR Normal gradients; no valvular stenosis  IMPRESSION AND PLAN: Tom Goodman has been referred for pre-anesthesia review and clearance prior to him undergoing the planned anesthetic and procedural courses. Available labs, pertinent testing, and imaging results were personally reviewed by me in preparation for upcoming operative/procedural course. [facility] medical record has been updated following extensive record review and patient interview with PAT staff.   This patient has been appropriately cleared by cardiology with an overall Slow risk of patient experiencing significant perioperative cardiovascular complications. Based on clinical review performed today (07/17/24), barring any significant acute changes in the patient's overall condition, it is anticipated that he will be able to proceed with the planned surgical intervention. Any acute changes in clinical condition may necessitate his procedure being postponed and/or cancelled. Patient will meet with anesthesia team (MD and/or CRNA) on the day of his procedure for preoperative evaluation/assessment. Questions regarding anesthetic course will be  fielded at that time.   Pre-surgical instructions were reviewed with the patient during his PAT appointment, and questions were fielded to satisfaction by PAT clinical staff. He has been instructed on which medications that he will need to hold prior to surgery, as well as the ones that have been deemed safe/appropriate to take on the day of his procedure. As part of the general education provided by PAT, patient made aware both verbally and in writing, that he would need to abstain from the use of any illegal substances during his perioperative course. He was advised that failure to follow the provided instructions could necessitate case cancellation or result in serious perioperative complications up to and including death. Patient encouraged to contact PAT and/or his surgeon's office to discuss any questions or concerns that may arise prior to surgery; verbalized understanding.   Dorise Pereyra, MSN, APRN, FNP-C, CEN [facility]  Perioperative Services Nurse Practitioner Phone: [phone] Fax: [phone] 07/17/24 3:38 PM  NOTE: This note has been prepared using Dragon dictation software. Despite my best ability to proofread, there is always the potential that unintentional transcriptional errors may still occur from this process.

## 2024-07-19 NOTE — H&P (Signed)
 ORTHOPAEDIC HISTORY & PHYSICAL Drake Fonda [place], [place] - 07/17/2024 10:30 AM EDT Formatting of this note is different from the original. NAME: Tom Goodman. H&P Date: 07/17/2024 Procedure Date: 07/22/2024  Chief Complaint: left knee pain, swelling, and giving way  HPI Tom Goodman. is a 73 y.o. male who has severe Left knee pain. Patient reports a several month history of progressively worsening left knee discomfort. He states that most of the pain localizes along the medial aspect of his knee. He does report intermittent swelling and giving way of the knee. Denies any locking or maltracking symptoms. He states that the pain is made worse with any prolonged weightbearing or ambulation. It greatly affects his ability to perform his ADLs and ambulate long distances as he would like. He has failed conservative treatment including prednisone , topical NSAIDs, narcotic analgesia, intra-articular corticosteroid and viscosupplementation injections, and activity modification. He is unable to tolerate oral NSAIDs secondary to CKD. He is not currently utilizing any ambulatory aids. He has requested operative intervention for relief of his DJD symptoms. Of note, patient does report a cardiac history where he had a stent placed about 11 years ago. No previous pulmonary issues. No previous DVTs or clots. He is not a diabetic. Denies any previous surgeries on this knee.  Social Hx: Patient is retired and lives at home with his wife will be helping look after him postoperatively. He denies any alcohol use, illicit drug use, smoking or nicotine use.  Medications & Allergies Allergies: No Known Allergies  Home Medicines: Current Outpatient Medications on File Prior to Visit Medication Sig Dispense Refill ARIPiprazole (ABILIFY) 5 MG tablet Take 1 tablet (5 mg total) by mouth at bedtime 90 tablet 3 aspirin  81 MG EC tablet Take 1 tablet (81 mg total) by mouth once daily 0 atorvastatin  (LIPITOR) 40 MG  tablet Take 1 tablet (40 mg total) by mouth once daily 90 tablet 3 chlorthalidone 25 MG tablet Take 1 tablet (25 mg total) by mouth once daily 90 tablet 3 citalopram  (CELEXA ) 20 MG tablet Take 1 tablet (20 mg total) by mouth once daily 90 tablet 3 cyanocobalamin (VITAMIN B12) 500 MCG tablet Take 500 mcg by mouth once daily ELDERBERRY FRUIT ORAL Take 1 tablet by mouth once daily gabapentin  (NEURONTIN ) 800 MG tablet Take 1 tablet (800 mg total) by mouth 2 (two) times daily 180 tablet 3 HYDROcodone -acetaminophen  (NORCO) 10-325 mg tablet Take 1 tablet by mouth every 6 (six) hours as needed for Pain 30 tablet 0 hydroxychloroquine (PLAQUENIL) 200 mg tablet Take 1 tablet (200 mg total) by mouth 2 (two) times daily 180 tablet 1 isosorbide  mononitrate (IMDUR ) 30 MG ER tablet Take 1 tablet (30 mg total) by mouth once daily 90 tablet 1 Lactobacillus acidophilus (PROBIOTIC ORAL) Take 1 tablet by mouth once daily Trunature digestive probiotic one by mouth daily loperamide  (IMODIUM ) 2 mg capsule Take 1 capsule (2 mg total) by mouth 3 (three) times daily as needed for Diarrhea 270 capsule 3 nitroGLYcerin  (NITROSTAT ) 0.4 MG SL tablet Place 1 tablet (0.4 mg total) under the tongue every 5 (five) minutes as needed for Chest pain May take up to 3 doses. 25 tablet 11 pantoprazole  (PROTONIX ) 40 MG DR tablet Take 1 tablet (40 mg total) by mouth 2 (two) times daily 180 tablet 3 tamsulosin (FLOMAX) 0.4 mg capsule Take 2 capsules (0.8 mg total) by mouth once daily Take 30 minutes after same meal each day. 180 capsule 3 FUROsemide (LASIX) 20 MG tablet Take 1 tablet (20  mg total) by mouth once daily 90 tablet 3  No current facility-administered medications on file prior to visit.  Medical / Surgical History  Past Medical History: Diagnosis Date Arthritis Bleeding disorder () CKD (chronic kidney disease) stage 3, GFR 30-59 ml/min (CMS-HCC) 05/14/2017 Coronary artery disease With prior PCI. Followed by Dr.  Ginny Depression GERD (gastroesophageal reflux disease) Hyperlipidemia Hypertension Kidney disease Prediabetes   Past Surgical History: Procedure Laterality Date Left total hip arthroplasty 10/25/2015 Dr. Sheril LAMINECTOMY POSTERIOR LUMBAR FACETECTOMY & FORAMINOTOMY W/DECOMP N/A 07/05/2020 Procedure: L4-5 AND L5-S1 MIS DECOMPRESSION AND DISCECTOMY; Surgeon: Salina Norleen Mighty, MD; Location: [facility] OR; Service: Neurosurgery; Laterality: N/A; LAMINECTOMY POSTERIOR CERVICLE DECOMP W/FACETECTOMY & FORAMINOTOMY N/A 07/05/2020 Procedure: LAMINECTOMY, FACETECTOMY AND FORAMINOTOMY, SINGLE VERTEBRAL SEGMENT; EACH ADDITIONAL SEGMENT, CERVICAL, THORACIC, OR LUMBAR (LIST IN ADDITION TO PRIMARY PROCEDURE); Surgeon: Salina Norleen Mighty, MD; Location: [facility] OR; Service: Neurosurgery; Laterality: N/A; INTRAOPERATIVE FLUOROSCOPY N/A 07/05/2020 Procedure: FLUOROSCOPY; Surgeon: Salina Norleen Mighty, MD; Location: [facility] OR; Service: Neurosurgery; Laterality: N/A; MICROSURGERY N/A 07/05/2020 Procedure: MICROSURGICAL TECHNIQUES, REQUIRING USE OF OPERATING MICROSCOPE; Surgeon: Salina Norleen Mighty, MD; Location: [facility] OR; Service: Neurosurgery; Laterality: N/A; ARTHROSCOPY SHOULDER Bilateral   Physical Exam  Ht:176.5 cm (5' 9.5) Wt:79.9 kg (176 lb 3.2 oz) BMI: Body mass index is 25.65 kg/m.  General/Constitutional: No apparent distress: well-nourished and well developed. Eyes: Pupils equal, round with synchronous movement. Lymphatic: No palpable adenopathy. Respiratory: Patient has good chest rise and fall with inspiration and expiration. All lung fields are clear to auscultation bilaterally. There is no Rales, rhonchi or wheezes appreciated. Cardiovascular: Upon auscultation there is a regular rate and rhythm without any murmurs, rubs, gallops or heaves appreciated. There does not appear to be any swelling down the lower extremities. Posterior tibial pulses appreciated bilaterally, 2+. Integumentary: No impressive skin  lesions present, except as noted in detailed exam. Neuro/Psych: Normal mood and affect, oriented to person, place and time. Musculoskeletal: see exam below  Left knee exam Upon inspection of the patient's left knee there does not appear to be any skin changes, open abrasions, swelling or redness. There is a varus alignment. Upon palpation, the patient reports having pain along the medial aspect of their knee. Patient has full extension actively with ROM, and able to flex back to 122 degrees with mild pain. Varus and valgus stress testing shows positive pseudolaxity to valgus stressing. The patella tracks well within the femoral groove from flexion into extension with mild crepitus appreciated. Anterior and posterior drawer testing negative. Patient is neurovascularly intact down their lower extremity to all dermatomes. Posterior tibial pulses appreciated 2+.  Imaging left Knee Imaging: None ordered today. Previous images from 06/02/2024 were reviewed. Upon inspection, there is noticeable closure along the medial cartilage space with bone-on-bone articulation noted. Subchondral sclerosis is appreciated. Overall alignment is relative varus. No fractures, lytic lesions or gross deformities appreciated on films.  Assesment and Plan Knee DJD  I have recommended that Tom Goodman. undergo left total knee replacement. Consents has been signed. The risks, benefits, prognosis and alternatives including but not limited to DVT, PE, infection, neurovascular injury, failure of the procedure and death were explained to the patient and he is willing to proceed with surgery as described to him by myself. Plan will be for post operative admission of at least 1 midnight for pain control and PT. He will be managed with DVT prophylaxis, antibiotics preoperatively for 24 hours and aggressive in patient rehab.  Pre, intra and post op interventions were discussed. Patient has good  understanding  Medication  Reconciliation was performed. Discussed cessation of vitamins and supplements.  A total of 40 minutes was spent reviewing patient's charts, medical reconciliation, discussing/educating the patient about surgical interventions, and answering any questions provided by the patient.  JOSHUA DALLAS KOYANAGI, PA [facility] 07/17/2024  Electronically signed by KOYANAGI Fonda DALLAS, PA at 07/17/2024 1:02 PM EDT

## 2024-07-21 ENCOUNTER — Encounter: Payer: Self-pay | Admitting: Orthopedic Surgery

## 2024-07-21 MED ORDER — ORAL CARE MOUTH RINSE: 15.0000 mL | Freq: Once | OROMUCOSAL | Status: AC

## 2024-07-21 MED ORDER — TRANEXAMIC ACID-NACL 1000-0.7 MG/100ML-% IV SOLN
1000.0000 mg | INTRAVENOUS | Status: AC
  Administered 2024-07-22: 1000 mg via INTRAVENOUS

## 2024-07-21 MED ORDER — CHLORHEXIDINE GLUCONATE 0.12 % MT SOLN
15.0000 mL | Freq: Once | OROMUCOSAL | Status: AC
  Administered 2024-07-22: 15 mL via OROMUCOSAL

## 2024-07-21 MED ORDER — GABAPENTIN 300 MG PO CAPS
300.0000 mg | ORAL_CAPSULE | Freq: Once | ORAL | Status: AC
  Administered 2024-07-22: 300 mg via ORAL

## 2024-07-21 MED ORDER — LACTATED RINGERS IV SOLN: INTRAVENOUS | Status: DC

## 2024-07-21 MED ORDER — CEFAZOLIN SODIUM-DEXTROSE 2-4 GM/100ML-% IV SOLN
2.0000 g | INTRAVENOUS | Status: AC
  Administered 2024-07-22: 2 g via INTRAVENOUS

## 2024-07-21 MED ORDER — CHLORHEXIDINE GLUCONATE 4 % EX SOLN
60.0000 mL | Freq: Once | CUTANEOUS | Status: AC
  Administered 2024-07-22: 4 via TOPICAL

## 2024-07-21 MED ORDER — DEXAMETHASONE SOD PHOSPHATE PF 10 MG/ML IJ SOLN
8.0000 mg | Freq: Once | INTRAMUSCULAR | Status: AC
  Administered 2024-07-22: 8 mg via INTRAVENOUS

## 2024-07-21 NOTE — Anesthesia Preprocedure Evaluation (Signed)
 Anesthesia Evaluation  Patient identified by MRN, date of birth, ID band Patient awake    Reviewed: Allergy & Precautions, H&P , NPO status , Patient's Chart, lab work & pertinent test results  Airway Mallampati: III  TM Distance: >3 FB Neck ROM: full    Dental no notable dental hx.    Pulmonary sleep apnea    Pulmonary exam normal        Cardiovascular hypertension, + CAD and + Cardiac Stents  Normal cardiovascular exam  LHC 4/25: 1st Mrg lesion is 100% stenosed.   Mid LAD lesion is 30% stenosed.   Mid RCA lesion is 30% stenosed.   RV Branch lesion is 75% stenosed.   Previously placed 3rd RPL stent of unknown type is  widely patent.   The left ventricular systolic function is normal.   LV end diastolic pressure is normal.   The left ventricular ejection fraction is 50-55% by visual estimate.   1.  One-vessel coronary artery disease with chronically occluded stent OM1, and patent stent RPL 3 2.  Normal left ventricular function   TTE performed on 12/31/2023 revealed a low normal left ventricular systolic function with an EF of 50%. There was no LVH.  There were no regional wall motion abnormalities. Left ventricular diastolic Doppler parameters consistent with abnormal relaxation (G1DD).   Myocardial perfusion imaging study was performed on 12/31/2023 revealing a  normal left ventricular systolic function with an EF of 64%.  There were no regional wall motion abnormalities   Neuro/Psych L4-S1 MIS decompression and discectomy 07/05/2020  Neuromuscular disease  negative psych ROS   GI/Hepatic negative GI ROS, Neg liver ROS,,,  Endo/Other  negative endocrine ROS    Renal/GU Renal InsufficiencyRenal disease     Musculoskeletal  (+) Arthritis ,  nflammatory arthritis     Comment:  a.) on DMARD therapy   Abdominal Normal abdominal exam  (+)   Peds  Hematology negative hematology ROS (+)   Anesthesia Other  Findings Past Medical History: 1970: Benign heart murmur No date: BPH (benign prostatic hyperplasia) No date: Cataracts, bilateral No date: Chest pain No date: CKD (chronic kidney disease), stage III (HCC) 03/09/2009: Coronary atherosclerosis of native coronary artery     Comment:  a.) PCI 06/09/2009: OM1 (DES; unk type); b.) staged PCI               03/22/2009: PLB (DES; unk type) No date: DDD (degenerative disc disease), lumbar     Comment:  a.) L4-S1 MIS decompression and discectomy 07/05/2020 No date: Depression No date: Diarrhea, functional No date: Diastolic dysfunction No date: Diverticulosis of colon No date: Essential hypertension, benign No date: GERD (gastroesophageal reflux disease) No date: History of bilateral cataract extraction No date: Hypercholesteremia 12/13/2016: Implantable loop recorder present No date: Inflammatory arthritis     Comment:  a.) on DMARD therapy (hydroxychloroquine) No date: Kidney stones No date: Long-term current use of immunomodulator     Comment:  a.) on DMARD therapy (hydroxychloroquine) for               inflammatory arthritis No date: Long-term use of aspirin  therapy No date: Mixed hyperlipidemia No date: OSA (obstructive sleep apnea)     Comment:  a.) no nocturnal PAP therapy No date: Osteoarthritis 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, commercial) No date: Prediabetes  Past Surgical History: No date: Bilateral thumb surgery Dr Sissy No date: CATARACT EXTRACTION; Bilateral No date: COLONOSCOPY 03/20/2016: COLONOSCOPY WITH PROPOFOL ; N/A     Comment:  Procedure: COLONOSCOPY WITH PROPOFOL ;  Surgeon: Gladis MARLA Louder, MD;  Location: [facility] ENDOSCOPY;  Service:               Endoscopy;  Laterality: N/A; 03/09/2009: CORONARY ANGIOPLASTY WITH STENT PLACEMENT; Left 03/22/2009: CORONARY STENT INTERVENTION; Left 01/29/2024: LEFT HEART CATH AND CORONARY ANGIOGRAPHY; Left     Comment:  Procedure: LEFT HEART CATH AND  CORONARY ANGIOGRAPHY;                Surgeon: Ammon Blunt, MD;  Location: [facility]               INVASIVE CV LAB;  Service: Cardiovascular;  Laterality:               Left; 11/23/2013: LEFT HEART CATHETERIZATION WITH CORONARY ANGIOGRAM; N/A     Comment:  Procedure: LEFT HEART CATHETERIZATION WITH CORONARY               ANGIOGRAM;  Surgeon: Candyce GORMAN Reek, MD;  Location:               [facility] CATH LAB;  Service: Cardiovascular;  Laterality: N/A; 12/13/2016: LOOP RECORDER INSERTION; N/A     Comment:  Procedure: Loop Recorder Insertion;  Surgeon: Lynwood Rakers, MD;  Location: [facility] INVASIVE CV LAB;  Service:               Cardiovascular;  Laterality: N/A; 06/2011: SHOULDER ARTHROSCOPY; Right 11/2010: SHOULDER ARTHROSCOPY; Left 10/25/2015: TOTAL HIP ARTHROPLASTY; Left     Comment:  Procedure: TOTAL HIP ARTHROPLASTY ANTERIOR APPROACH;                Surgeon: Maude Herald, MD;  Location: [facility] OR;  Service:               Orthopedics;  Laterality: Left; No date: VASECTOMY     Reproductive/Obstetrics negative OB ROS                              Anesthesia Physical Anesthesia Plan  ASA: 3  Anesthesia Plan: Spinal   Post-op Pain Management: Regional block* and Ofirmev  IV (intra-op)*   Induction: Intravenous  PONV Risk Score and Plan: Propofol  infusion and Dexamethasone  Airway Management Planned: Natural Airway  Additional Equipment:   Intra-op Plan:   Post-operative Plan:   Informed Consent: I have reviewed the patients History and Physical, chart, labs and discussed the procedure including the risks, benefits and alternatives for the proposed anesthesia with the patient or authorized representative who has indicated his/her understanding and acceptance.     Dental Advisory Given  Plan Discussed with: CRNA and Surgeon  Anesthesia Plan Comments:          Anesthesia Quick Evaluation

## 2024-07-22 ENCOUNTER — Observation Stay
Admission: RE | Admit: 2024-07-22 | Discharge: 2024-07-23 | Disposition: A | Discharge status: Home / Self Care | Attending: Orthopedic Surgery | Admitting: Orthopedic Surgery

## 2024-07-22 ENCOUNTER — Observation Stay

## 2024-07-22 ENCOUNTER — Ambulatory Visit: Payer: Self-pay | Admitting: Urgent Care

## 2024-07-22 ENCOUNTER — Encounter
Admission: RE | Disposition: A | Discharge status: Home / Self Care | Payer: Self-pay | Source: Home / Self Care | Attending: Orthopedic Surgery

## 2024-07-22 ENCOUNTER — Other Ambulatory Visit: Payer: Self-pay

## 2024-07-22 ENCOUNTER — Encounter: Payer: Self-pay | Admitting: Orthopedic Surgery

## 2024-07-22 DIAGNOSIS — K5730 Diverticulosis of large intestine without perforation or abscess without bleeding: Secondary | ICD-10-CM | POA: Diagnosis not present

## 2024-07-22 DIAGNOSIS — Z79899 Other long term (current) drug therapy: Secondary | ICD-10-CM | POA: Insufficient documentation

## 2024-07-22 DIAGNOSIS — M25562 Pain in left knee: Secondary | ICD-10-CM | POA: Diagnosis present

## 2024-07-22 DIAGNOSIS — I2510 Atherosclerotic heart disease of native coronary artery without angina pectoris: Secondary | ICD-10-CM | POA: Diagnosis not present

## 2024-07-22 DIAGNOSIS — Z96652 Presence of left artificial knee joint: Secondary | ICD-10-CM | POA: Diagnosis not present

## 2024-07-22 DIAGNOSIS — N183 Chronic kidney disease, stage 3 unspecified: Secondary | ICD-10-CM | POA: Diagnosis not present

## 2024-07-22 DIAGNOSIS — M1712 Unilateral primary osteoarthritis, left knee: Principal | ICD-10-CM | POA: Insufficient documentation

## 2024-07-22 DIAGNOSIS — Z7982 Long term (current) use of aspirin: Secondary | ICD-10-CM | POA: Insufficient documentation

## 2024-07-22 DIAGNOSIS — I129 Hypertensive chronic kidney disease with stage 1 through stage 4 chronic kidney disease, or unspecified chronic kidney disease: Secondary | ICD-10-CM | POA: Insufficient documentation

## 2024-07-22 DIAGNOSIS — R7303 Prediabetes: Secondary | ICD-10-CM

## 2024-07-22 DIAGNOSIS — I200 Unstable angina: Secondary | ICD-10-CM

## 2024-07-22 HISTORY — DX: Long term (current) use of aspirin: Z79.82

## 2024-07-22 HISTORY — DX: Cataract extraction status, right eye: Z98.41

## 2024-07-22 HISTORY — DX: Benign prostatic hyperplasia without lower urinary tract symptoms: N40.0

## 2024-07-22 HISTORY — DX: Other ill-defined heart diseases: I51.89

## 2024-07-22 HISTORY — DX: Depression, unspecified: F32.A

## 2024-07-22 HISTORY — PX: KNEE ARTHROPLASTY: SHX992

## 2024-07-22 HISTORY — DX: Long term (current) use of immunomodulator: Z79.61

## 2024-07-22 HISTORY — DX: Other specified arthritis, unspecified site: M13.80

## 2024-07-22 HISTORY — DX: Prediabetes: R73.03

## 2024-07-22 HISTORY — DX: Other intervertebral disc degeneration, lumbar region without mention of lumbar back pain or lower extremity pain: M51.369

## 2024-07-22 LAB — POCT I-STAT, CHEM 8
BUN: 24 mg/dL — ABNORMAL HIGH (ref 8–23)
Calcium, Ion: 1.15 mmol/L (ref 1.15–1.40)
Chloride: 103 mmol/L (ref 98–111)
Creatinine, Ser: 2.2 mg/dL — ABNORMAL HIGH (ref 0.61–1.24)
Glucose, Bld: 127 mg/dL — ABNORMAL HIGH (ref 70–99)
HCT: 40 % (ref 39.0–52.0)
Hemoglobin: 13.6 g/dL (ref 13.0–17.0)
Potassium: 3 mmol/L — ABNORMAL LOW (ref 3.5–5.1)
Sodium: 142 mmol/L (ref 135–145)
TCO2: 23 mmol/L (ref 22–32)

## 2024-07-22 SURGERY — ARTHROPLASTY, KNEE, TOTAL, USING IMAGELESS COMPUTER-ASSISTED NAVIGATION
Anesthesia: Spinal | Site: Knee | Laterality: Left

## 2024-07-22 MED ORDER — PHENYLEPHRINE 80 MCG/ML (10ML) SYRINGE FOR IV PUSH (FOR BLOOD PRESSURE SUPPORT)
PREFILLED_SYRINGE | INTRAVENOUS | Status: AC
  Filled 2024-07-22: qty 10

## 2024-07-22 MED ORDER — ONDANSETRON HCL 4 MG/2ML IJ SOLN: 4.0000 mg | Freq: Four times a day (QID) | INTRAMUSCULAR | Status: DC | PRN

## 2024-07-22 MED ORDER — GABAPENTIN 300 MG PO CAPS
ORAL_CAPSULE | ORAL | Status: AC
  Filled 2024-07-22: qty 1

## 2024-07-22 MED ORDER — GABAPENTIN 400 MG PO CAPS
800.0000 mg | ORAL_CAPSULE | Freq: Two times a day (BID) | ORAL | Status: DC
  Administered 2024-07-22 – 2024-07-23 (×3): 800 mg via ORAL
  Filled 2024-07-22 (×3): qty 2

## 2024-07-22 MED ORDER — SODIUM CHLORIDE 0.9 % IV SOLN: INTRAVENOUS | Status: DC

## 2024-07-22 MED ORDER — ALUM & MAG HYDROXIDE-SIMETH 200-200-20 MG/5ML PO SUSP: 30.0000 mL | ORAL | Status: DC | PRN

## 2024-07-22 MED ORDER — PROPOFOL 10 MG/ML IV BOLUS
INTRAVENOUS | Status: DC | PRN
  Administered 2024-07-22: 20 mg via INTRAVENOUS

## 2024-07-22 MED ORDER — DEXMEDETOMIDINE HCL IN NACL 80 MCG/20ML IV SOLN
INTRAVENOUS | Status: DC | PRN
  Administered 2024-07-22 (×2): 4 ug via INTRAVENOUS

## 2024-07-22 MED ORDER — FLEET ENEMA RE ENEM: 1.0000 | ENEMA | Freq: Once | RECTAL | Status: DC | PRN

## 2024-07-22 MED ORDER — TRANEXAMIC ACID-NACL 1000-0.7 MG/100ML-% IV SOLN
INTRAVENOUS | Status: AC
  Filled 2024-07-22: qty 100

## 2024-07-22 MED ORDER — ACETAMINOPHEN 10 MG/ML IV SOLN
INTRAVENOUS | Status: DC | PRN
  Administered 2024-07-22: 1000 mg via INTRAVENOUS

## 2024-07-22 MED ORDER — OXYCODONE HCL 5 MG PO TABS: 5.0000 mg | ORAL_TABLET | Freq: Once | ORAL | Status: DC | PRN

## 2024-07-22 MED ORDER — SODIUM CHLORIDE 0.9 % IR SOLN
Status: DC | PRN
  Administered 2024-07-22: 3000 mL

## 2024-07-22 MED ORDER — CEFAZOLIN SODIUM-DEXTROSE 2-4 GM/100ML-% IV SOLN
2.0000 g | Freq: Four times a day (QID) | INTRAVENOUS | Status: AC
  Administered 2024-07-22 (×2): 2 g via INTRAVENOUS
  Filled 2024-07-22 (×2): qty 100

## 2024-07-22 MED ORDER — ONDANSETRON HCL 4 MG PO TABS: 4.0000 mg | ORAL_TABLET | Freq: Four times a day (QID) | ORAL | Status: DC | PRN

## 2024-07-22 MED ORDER — HYDROXYCHLOROQUINE SULFATE 200 MG PO TABS
200.0000 mg | ORAL_TABLET | Freq: Two times a day (BID) | ORAL | Status: DC
  Administered 2024-07-22 – 2024-07-23 (×3): 200 mg via ORAL
  Filled 2024-07-22 (×3): qty 1

## 2024-07-22 MED ORDER — TAMSULOSIN HCL 0.4 MG PO CAPS
0.8000 mg | ORAL_CAPSULE | Freq: Every day | ORAL | Status: DC
  Administered 2024-07-22: 0.8 mg via ORAL
  Filled 2024-07-22: qty 2

## 2024-07-22 MED ORDER — TRAMADOL HCL 50 MG PO TABS: 50.0000 mg | ORAL_TABLET | ORAL | Status: DC | PRN

## 2024-07-22 MED ORDER — MIDAZOLAM HCL 2 MG/2ML IJ SOLN
INTRAMUSCULAR | Status: AC
  Filled 2024-07-22: qty 2

## 2024-07-22 MED ORDER — ACETAMINOPHEN 10 MG/ML IV SOLN: 1000.0000 mg | Freq: Once | INTRAVENOUS | Status: DC | PRN

## 2024-07-22 MED ORDER — HYDROMORPHONE HCL 1 MG/ML IJ SOLN: 0.5000 mg | INTRAMUSCULAR | Status: DC | PRN

## 2024-07-22 MED ORDER — BUPIVACAINE HCL (PF) 0.5 % IJ SOLN
INTRAMUSCULAR | Status: AC
  Filled 2024-07-22: qty 10

## 2024-07-22 MED ORDER — PHENOL 1.4 % MT LIQD: 1.0000 | OROMUCOSAL | Status: DC | PRN

## 2024-07-22 MED ORDER — DIPHENHYDRAMINE HCL 12.5 MG/5ML PO ELIX: 12.5000 mg | ORAL_SOLUTION | ORAL | Status: DC | PRN

## 2024-07-22 MED ORDER — SURGIPHOR WOUND IRRIGATION SYSTEM - OPTIME: TOPICAL | Status: DC | PRN

## 2024-07-22 MED ORDER — PROPOFOL 1000 MG/100ML IV EMUL
INTRAVENOUS | Status: AC
  Filled 2024-07-22: qty 100

## 2024-07-22 MED ORDER — CITALOPRAM HYDROBROMIDE 20 MG PO TABS
20.0000 mg | ORAL_TABLET | Freq: Every day | ORAL | Status: DC
  Administered 2024-07-23: 20 mg via ORAL
  Filled 2024-07-22: qty 1

## 2024-07-22 MED ORDER — OXYCODONE HCL 5 MG PO TABS
10.0000 mg | ORAL_TABLET | ORAL | Status: DC | PRN
  Administered 2024-07-22 – 2024-07-23 (×3): 10 mg via ORAL
  Filled 2024-07-22 (×3): qty 2

## 2024-07-22 MED ORDER — PROPOFOL 1000 MG/100ML IV EMUL
INTRAVENOUS | Status: AC
Start: 2024-07-22 — End: 2024-07-22
  Filled 2024-07-22: qty 100

## 2024-07-22 MED ORDER — ARIPIPRAZOLE 2 MG PO TABS
5.0000 mg | ORAL_TABLET | Freq: Every day | ORAL | Status: DC
  Administered 2024-07-23: 5 mg via ORAL
  Filled 2024-07-22: qty 3
  Filled 2024-07-22: qty 1

## 2024-07-22 MED ORDER — ONDANSETRON HCL 4 MG/2ML IJ SOLN
INTRAMUSCULAR | Status: DC | PRN
  Administered 2024-07-22: 4 mg via INTRAVENOUS

## 2024-07-22 MED ORDER — DROPERIDOL 2.5 MG/ML IJ SOLN: 0.6250 mg | Freq: Once | INTRAMUSCULAR | Status: DC | PRN

## 2024-07-22 MED ORDER — ACETAMINOPHEN 325 MG PO TABS: 325.0000 mg | ORAL_TABLET | Freq: Four times a day (QID) | ORAL | Status: DC | PRN

## 2024-07-22 MED ORDER — MENTHOL 3 MG MT LOZG: 1.0000 | LOZENGE | OROMUCOSAL | Status: DC | PRN

## 2024-07-22 MED ORDER — LIDOCAINE HCL (PF) 2 % IJ SOLN
INTRAMUSCULAR | Status: AC
  Filled 2024-07-22: qty 5

## 2024-07-22 MED ORDER — FENTANYL CITRATE (PF) 100 MCG/2ML IJ SOLN: 25.0000 ug | INTRAMUSCULAR | Status: DC | PRN

## 2024-07-22 MED ORDER — GLYCOPYRROLATE 0.2 MG/ML IJ SOLN
INTRAMUSCULAR | Status: AC
  Filled 2024-07-22: qty 1

## 2024-07-22 MED ORDER — OXYCODONE HCL 5 MG/5ML PO SOLN: 5.0000 mg | Freq: Once | ORAL | Status: DC | PRN

## 2024-07-22 MED ORDER — BISACODYL 10 MG RE SUPP: 10.0000 mg | Freq: Every day | RECTAL | Status: DC | PRN

## 2024-07-22 MED ORDER — ACETAMINOPHEN 10 MG/ML IV SOLN
INTRAVENOUS | Status: AC
  Filled 2024-07-22: qty 100

## 2024-07-22 MED ORDER — MAGNESIUM HYDROXIDE 400 MG/5ML PO SUSP
30.0000 mL | Freq: Every day | ORAL | Status: DC
  Filled 2024-07-22: qty 30

## 2024-07-22 MED ORDER — CHLORTHALIDONE 25 MG PO TABS
25.0000 mg | ORAL_TABLET | Freq: Every day | ORAL | Status: DC
Start: 2024-07-22 — End: 2024-07-23
  Administered 2024-07-22 – 2024-07-23 (×2): 25 mg via ORAL
  Filled 2024-07-22 (×2): qty 1

## 2024-07-22 MED ORDER — BUPIVACAINE HCL (PF) 0.5 % IJ SOLN
INTRAMUSCULAR | Status: DC | PRN
  Administered 2024-07-22: 3 mL via INTRATHECAL

## 2024-07-22 MED ORDER — PHENYLEPHRINE 80 MCG/ML (10ML) SYRINGE FOR IV PUSH (FOR BLOOD PRESSURE SUPPORT)
PREFILLED_SYRINGE | INTRAVENOUS | Status: DC | PRN
  Administered 2024-07-22 (×8): 80 ug via INTRAVENOUS

## 2024-07-22 MED ORDER — ORAL CARE MOUTH RINSE: 15.0000 mL | OROMUCOSAL | Status: DC | PRN

## 2024-07-22 MED ORDER — NITROGLYCERIN 0.4 MG SL SUBL
0.4000 mg | SUBLINGUAL_TABLET | SUBLINGUAL | Status: DC | PRN
Start: 2024-07-22 — End: 2024-07-23

## 2024-07-22 MED ORDER — CELECOXIB 200 MG PO CAPS
200.0000 mg | ORAL_CAPSULE | Freq: Two times a day (BID) | ORAL | Status: DC
  Administered 2024-07-22 – 2024-07-23 (×2): 200 mg via ORAL
  Filled 2024-07-22 (×2): qty 1

## 2024-07-22 MED ORDER — ASPIRIN 81 MG PO CHEW
81.0000 mg | CHEWABLE_TABLET | Freq: Two times a day (BID) | ORAL | Status: DC
  Administered 2024-07-23: 81 mg via ORAL
  Filled 2024-07-22: qty 1

## 2024-07-22 MED ORDER — CEFAZOLIN SODIUM-DEXTROSE 2-4 GM/100ML-% IV SOLN
INTRAVENOUS | Status: AC
  Filled 2024-07-22: qty 100

## 2024-07-22 MED ORDER — SENNOSIDES-DOCUSATE SODIUM 8.6-50 MG PO TABS
1.0000 | ORAL_TABLET | Freq: Two times a day (BID) | ORAL | Status: DC
Start: 2024-07-22 — End: 2024-07-23
  Administered 2024-07-22 – 2024-07-23 (×3): 1 via ORAL
  Filled 2024-07-22 (×3): qty 1

## 2024-07-22 MED ORDER — PANTOPRAZOLE SODIUM 40 MG PO TBEC
40.0000 mg | DELAYED_RELEASE_TABLET | Freq: Two times a day (BID) | ORAL | Status: DC
  Administered 2024-07-22 – 2024-07-23 (×3): 40 mg via ORAL
  Filled 2024-07-22 (×3): qty 1

## 2024-07-22 MED ORDER — GLYCOPYRROLATE 0.2 MG/ML IJ SOLN
INTRAMUSCULAR | Status: DC | PRN
Start: 2024-07-22 — End: 2024-07-22
  Administered 2024-07-22 (×2): .2 mg via INTRAVENOUS

## 2024-07-22 MED ORDER — ONDANSETRON HCL 4 MG/2ML IJ SOLN
INTRAMUSCULAR | Status: AC
  Filled 2024-07-22: qty 2

## 2024-07-22 MED ORDER — RISAQUAD PO CAPS
1.0000 | ORAL_CAPSULE | Freq: Every day | ORAL | Status: DC
  Administered 2024-07-23: 1 via ORAL
  Filled 2024-07-22: qty 1

## 2024-07-22 MED ORDER — METOCLOPRAMIDE HCL 10 MG PO TABS
10.0000 mg | ORAL_TABLET | Freq: Three times a day (TID) | ORAL | Status: DC
  Administered 2024-07-22 – 2024-07-23 (×3): 10 mg via ORAL
  Filled 2024-07-22 (×3): qty 1

## 2024-07-22 MED ORDER — ENSURE PRE-SURGERY PO LIQD
296.0000 mL | Freq: Once | ORAL | Status: AC
  Administered 2024-07-22: 296 mL via ORAL
  Filled 2024-07-22: qty 296

## 2024-07-22 MED ORDER — EPHEDRINE SULFATE-NACL 50-0.9 MG/10ML-% IV SOSY
PREFILLED_SYRINGE | INTRAVENOUS | Status: DC | PRN
Start: 2024-07-22 — End: 2024-07-22
  Administered 2024-07-22 (×2): 10 mg via INTRAVENOUS

## 2024-07-22 MED ORDER — ATORVASTATIN CALCIUM 10 MG PO TABS
40.0000 mg | ORAL_TABLET | Freq: Every day | ORAL | Status: DC
Start: 2024-07-22 — End: 2024-07-23
  Administered 2024-07-22: 40 mg via ORAL
  Filled 2024-07-22: qty 4

## 2024-07-22 MED ORDER — TRANEXAMIC ACID-NACL 1000-0.7 MG/100ML-% IV SOLN
1000.0000 mg | Freq: Once | INTRAVENOUS | Status: AC
  Administered 2024-07-22: 1000 mg via INTRAVENOUS

## 2024-07-22 MED ORDER — FUROSEMIDE 20 MG PO TABS
20.0000 mg | ORAL_TABLET | Freq: Every day | ORAL | Status: DC
  Filled 2024-07-22 (×2): qty 1

## 2024-07-22 MED ORDER — PROPOFOL 500 MG/50ML IV EMUL
INTRAVENOUS | Status: DC | PRN
  Administered 2024-07-22: 80 ug/kg/min via INTRAVENOUS

## 2024-07-22 MED ORDER — LIDOCAINE HCL (CARDIAC) PF 100 MG/5ML IV SOSY
PREFILLED_SYRINGE | INTRAVENOUS | Status: DC | PRN
  Administered 2024-07-22: 60 mg via INTRAVENOUS

## 2024-07-22 MED ORDER — EPHEDRINE 5 MG/ML INJ
INTRAVENOUS | Status: AC
  Filled 2024-07-22: qty 5

## 2024-07-22 MED ORDER — MIDAZOLAM HCL 5 MG/5ML IJ SOLN
INTRAMUSCULAR | Status: DC | PRN
Start: 2024-07-22 — End: 2024-07-22
  Administered 2024-07-22: 2 mg via INTRAVENOUS

## 2024-07-22 MED ORDER — ISOSORBIDE MONONITRATE ER 30 MG PO TB24
30.0000 mg | ORAL_TABLET | Freq: Every day | ORAL | Status: DC
  Administered 2024-07-23: 30 mg via ORAL
  Filled 2024-07-22: qty 1

## 2024-07-22 MED ORDER — OXYCODONE HCL 5 MG PO TABS: 5.0000 mg | ORAL_TABLET | ORAL | Status: DC | PRN

## 2024-07-22 MED ORDER — SODIUM CHLORIDE (PF) 0.9 % IJ SOLN
INTRAMUSCULAR | Status: DC | PRN
  Administered 2024-07-22: 120 mL via INTRAMUSCULAR

## 2024-07-22 MED ORDER — FENTANYL CITRATE (PF) 100 MCG/2ML IJ SOLN
INTRAMUSCULAR | Status: AC
  Filled 2024-07-22: qty 2

## 2024-07-22 MED ORDER — CHLORHEXIDINE GLUCONATE 0.12 % MT SOLN
OROMUCOSAL | Status: AC
Start: 2024-07-22 — End: 2024-07-22
  Filled 2024-07-22: qty 15

## 2024-07-22 MED ORDER — ACETAMINOPHEN 10 MG/ML IV SOLN
1000.0000 mg | Freq: Four times a day (QID) | INTRAVENOUS | Status: DC
  Administered 2024-07-22 – 2024-07-23 (×3): 1000 mg via INTRAVENOUS
  Filled 2024-07-22 (×4): qty 100

## 2024-07-22 MED ORDER — FERROUS SULFATE 325 (65 FE) MG PO TABS
325.0000 mg | ORAL_TABLET | Freq: Two times a day (BID) | ORAL | Status: DC
  Administered 2024-07-22 – 2024-07-23 (×2): 325 mg via ORAL
  Filled 2024-07-22 (×2): qty 1

## 2024-07-22 MED ORDER — FENTANYL CITRATE (PF) 100 MCG/2ML IJ SOLN
INTRAMUSCULAR | Status: DC | PRN
  Administered 2024-07-22: 50 ug via INTRAVENOUS

## 2024-07-22 SURGICAL SUPPLY — 63 items
ATTUNE MED DOME PAT 41 KNEE (Knees) IMPLANT
ATTUNE PS FEM LT SZ 5 CEM KNEE (Femur) IMPLANT
ATTUNE PSRP INSR SZ5 5 KNEE (Insert) IMPLANT
BASE TIBIA ATTUNE KNEE SYS SZ6 (Knees) IMPLANT
BATTERY INSTRU NAVIGATION (MISCELLANEOUS) ×4 IMPLANT
BIT DRILL QUICK REL 1/8 2PK SL (BIT) ×1 IMPLANT
BLADE CLIPPER SURG (BLADE) IMPLANT
BLADE SAW 70X12.5 (BLADE) ×1 IMPLANT
BLADE SAW 90X13X1.19 OSCILLAT (BLADE) ×1 IMPLANT
BLADE SAW 90X25X1.19 OSCILLAT (BLADE) ×1 IMPLANT
BRUSH SCRUB EZ PLAIN DRY (MISCELLANEOUS) ×1 IMPLANT
CEMENT BONE GENTAMICIN 40 (Cement) IMPLANT
COOLER ICEMAN CLASSIC (MISCELLANEOUS) ×1 IMPLANT
CUFF TRNQT CYL 24X4X16.5-23 (TOURNIQUET CUFF) IMPLANT
CUFF TRNQT CYL 30X4X21-28X (TOURNIQUET CUFF) IMPLANT
DRAPE SHEET LG 3/4 BI-LAMINATE (DRAPES) ×1 IMPLANT
DRSG AQUACEL AG ADV 3.5X14 (GAUZE/BANDAGES/DRESSINGS) ×1 IMPLANT
DRSG MEPILEX SACRM 8.7X9.8 (GAUZE/BANDAGES/DRESSINGS) ×1 IMPLANT
DRSG TEGADERM 4X4.75 (GAUZE/BANDAGES/DRESSINGS) ×1 IMPLANT
DURAPREP 26ML APPLICATOR (WOUND CARE) ×2 IMPLANT
ELECT CAUTERY BLADE 6.4 (BLADE) ×1 IMPLANT
ELECTRODE REM PT RTRN 9FT ADLT (ELECTROSURGICAL) ×1 IMPLANT
EVACUATOR 1/8 PVC DRAIN (DRAIN) ×1 IMPLANT
EX-PIN ORTHOLOCK NAV 4X150 (PIN) ×2 IMPLANT
GAUZE XEROFORM 1X8 LF (GAUZE/BANDAGES/DRESSINGS) ×1 IMPLANT
GLOVE BIOGEL M STRL SZ7.5 (GLOVE) ×6 IMPLANT
GLOVE BIOGEL PI IND STRL 8 (GLOVE) ×1 IMPLANT
GLOVE SRG 8 PF TXTR STRL LF DI (GLOVE) ×1 IMPLANT
GOWN STRL REUS W/ TWL LRG LVL3 (GOWN DISPOSABLE) ×1 IMPLANT
GOWN STRL REUS W/ TWL XL LVL3 (GOWN DISPOSABLE) ×1 IMPLANT
GOWN TOGA ZIPPER T7+ PEEL AWAY (MISCELLANEOUS) ×1 IMPLANT
HOLDER FOLEY CATH W/STRAP (MISCELLANEOUS) ×1 IMPLANT
HOOD PEEL AWAY T7 (MISCELLANEOUS) ×1 IMPLANT
KIT TURNOVER KIT A (KITS) ×1 IMPLANT
KNIFE SCULPS 14X20 (INSTRUMENTS) ×1 IMPLANT
MANIFOLD NEPTUNE II (INSTRUMENTS) ×2 IMPLANT
NDL SPNL 20GX3.5 QUINCKE YW (NEEDLE) ×2 IMPLANT
NEEDLE SPNL 20GX3.5 QUINCKE YW (NEEDLE) ×2 IMPLANT
PACK TOTAL KNEE (MISCELLANEOUS) ×1 IMPLANT
PAD ABD DERMACEA PRESS 5X9 (GAUZE/BANDAGES/DRESSINGS) ×2 IMPLANT
PAD ARMBOARD POSITIONER FOAM (MISCELLANEOUS) ×3 IMPLANT
PAD COLD UNI WRAP-ON (PAD) ×1 IMPLANT
PENCIL SMOKE EVACUATOR COATED (MISCELLANEOUS) ×1 IMPLANT
PIN DRILL FIX HALF THREAD (BIT) ×2 IMPLANT
PIN FIXATION 1/8DIA X 3INL (PIN) ×1 IMPLANT
SOL .9 NS 3000ML IRR UROMATIC (IV SOLUTION) ×1 IMPLANT
SOLN STERILE WATER BTL 1000 ML (IV SOLUTION) ×1 IMPLANT
SOLUTION IRRIG SURGIPHOR (IV SOLUTION) ×1 IMPLANT
SPONGE DRAIN TRACH 4X4 STRL 2S (GAUZE/BANDAGES/DRESSINGS) ×1 IMPLANT
STAPLER SKIN PROX 35W (STAPLE) ×1 IMPLANT
STOCKINETTE IMPERV 14X48 (MISCELLANEOUS) ×1 IMPLANT
STOCKINETTE STRL BIAS CUT 8X4 (MISCELLANEOUS) ×1 IMPLANT
STRAP TIBIA SHORT (MISCELLANEOUS) ×1 IMPLANT
SUCTION TUBE FRAZIER 10FR DISP (SUCTIONS) ×1 IMPLANT
SUT VIC AB 0 CT1 36 (SUTURE) ×1 IMPLANT
SUT VIC AB 1 CT1 36 (SUTURE) ×2 IMPLANT
SUT VIC AB 2-0 CT2 27 (SUTURE) ×1 IMPLANT
SYR 30ML LL (SYRINGE) ×2 IMPLANT
TIP FAN IRRIG PULSAVAC PLUS (DISPOSABLE) ×1 IMPLANT
TOWEL OR 17X26 4PK STRL BLUE (TOWEL DISPOSABLE) IMPLANT
TOWER CARTRIDGE SMART MIX (DISPOSABLE) ×1 IMPLANT
TRAP FLUID SMOKE EVACUATOR (MISCELLANEOUS) ×1 IMPLANT
TRAY FOLEY MTR SLVR 16FR STAT (SET/KITS/TRAYS/PACK) ×1 IMPLANT

## 2024-07-22 NOTE — Transfer of Care (Signed)
 Immediate Anesthesia Transfer of Care Note  Patient: Tom Goodman  Procedure(s) Performed: ARTHROPLASTY, KNEE, TOTAL, USING IMAGELESS COMPUTER-ASSISTED NAVIGATION (Left: Knee)  Patient Location: PACU  Anesthesia Type:Spinal  Level of Consciousness: awake, alert , and oriented  Airway & Oxygen Therapy: Patient Spontanous Breathing  Post-op Assessment: Report given to RN and Post -op Vital signs reviewed and stable  Post vital signs: Reviewed and stable  Last Vitals:  Vitals Value Taken Time  BP 106/56 07/22/24 15:01  Temp 35.9 1501  Pulse 84 07/22/24 15:02  Resp 14 07/22/24 15:02  SpO2 96 % 07/22/24 15:02  Vitals shown include unfiled device data.  Last Pain:  Vitals:   07/22/24 0956  TempSrc: Temporal  PainSc: 0-No pain         Complications: No notable events documented.

## 2024-07-22 NOTE — Anesthesia Procedure Notes (Signed)
 Spinal  Patient location during procedure: OR Start time: 07/22/2024 11:47 AM End time: 07/22/2024 11:52 AM Reason for block: surgical anesthesia Staffing Performed: resident/CRNA  Resident/CRNA: Trudy Rankin LABOR, CRNA Performed by: Trudy Rankin LABOR, CRNA Authorized by: Vicci Camellia Glatter, MD   Preanesthetic Checklist Completed: patient identified, IV checked, site marked, risks and benefits discussed, surgical consent, monitors and equipment checked, pre-op evaluation and timeout performed Spinal Block Patient position: sitting Prep: ChloraPrep Patient monitoring: heart rate, continuous pulse ox and blood pressure Approach: midline Location: L3-4 Injection technique: single-shot Needle Needle type: Introducer and Pencan  Needle gauge: 24 G Needle length: 10 cm Needle insertion depth: 7 cm Assessment Sensory level: T4 Events: CSF return Additional Notes Negative paresthesia. Negative blood return. Positive free-flowing CSF. Expiration date of kit checked and confirmed. Patient tolerated procedure well, without complications.

## 2024-07-22 NOTE — Progress Notes (Signed)
 Subjective: 1 Day Post-Op Procedure(s) (LRB): ARTHROPLASTY, KNEE, TOTAL, USING IMAGELESS COMPUTER-ASSISTED NAVIGATION (Left) Patient reports pain as mild.   Patient seen in rounds with Dr. Mardee. Patient is well, and has had no acute complaints or problems Denies any CP, SOB, N/V, fevers or chills We will start therapy today.  Plan is to go Home after hospital stay.  Objective: Vital signs in last 24 hours: Temp:  [97.6 F (36.4 C)-98.5 F (36.9 C)] 98.5 F (36.9 C) (10/23 0726) Pulse Rate:  [60-82] 78 (10/23 0726) Resp:  [12-18] 15 (10/23 0726) BP: (106-137)/(56-92) 127/68 (10/23 0726) SpO2:  [95 %-100 %] 96 % (10/23 0726) Weight:  [79.9 kg] 79.9 kg (10/22 1515)  Intake/Output from previous day:  Intake/Output Summary (Last 24 hours) at 07/23/2024 0808 Last data filed at 07/23/2024 0726 Gross per 24 hour  Intake 2682.21 ml  Output 830 ml  Net 1852.21 ml    Intake/Output this shift: Total I/O In: -  Out: 200 [Urine:200]  Labs: Recent Labs    07/22/24 1016  HGB 13.6   Recent Labs    07/22/24 1016  HCT 40.0   Recent Labs    07/22/24 1016  NA 142  K 3.0*  CL 103  BUN 24*  CREATININE 2.20*  GLUCOSE 127*   No results for input(s): LABPT, INR in the last 72 hours.  EXAM General - Patient is Alert, Appropriate, and Oriented Extremity - Neurologically intact Neurovascular intact Sensation intact distally Intact pulses distally Dorsiflexion/Plantar flexion intact No cellulitis present Compartment soft Dressing - dressing C/D/I and no drainage Motor Function - intact, moving foot and toes well on exam. JP Drain pulled without difficulty. Intact  Past Medical History:  Diagnosis Date   Benign heart murmur 1970   BPH (benign prostatic hyperplasia)    Cataracts, bilateral    Chest pain    CKD (chronic kidney disease), stage III (HCC)    Coronary atherosclerosis of native coronary artery 03/09/2009   a.) PCI 06/09/2009: OM1 (DES; unk type); b.)  staged PCI 03/22/2009: PLB (DES; unk type)   DDD (degenerative disc disease), lumbar    a.) L4-S1 MIS decompression and discectomy 07/05/2020   Depression    Diarrhea, functional    Diastolic dysfunction    Diverticulosis of colon    Essential hypertension, benign    GERD (gastroesophageal reflux disease)    History of bilateral cataract extraction    Hypercholesteremia    Implantable loop recorder present 12/13/2016   Inflammatory arthritis    a.) on DMARD therapy (hydroxychloroquine)   Kidney stones    Long-term current use of immunomodulator    a.) on DMARD therapy (hydroxychloroquine) for inflammatory arthritis   Long-term use of aspirin  therapy    Mixed hyperlipidemia    OSA (obstructive sleep apnea)    a.) no nocturnal PAP therapy   Osteoarthritis    Personal history of Horticulturist, commercial)    Prediabetes     Assessment/Plan: 1 Day Post-Op Procedure(s) (LRB): ARTHROPLASTY, KNEE, TOTAL, USING IMAGELESS COMPUTER-ASSISTED NAVIGATION (Left) Principal Problem:   History of total knee arthroplasty, left  Estimated body mass index is 25.28 kg/m as calculated from the following:   Height as of this encounter: 5' 10 (1.778 m).   Weight as of this encounter: 79.9 kg. Advance diet Up with therapy  Patient will continue to work with physical therapy to pass postoperative PT protocols, ROM and strengthening  Discussed with the patient continuing to utilize Polar Care  Patient will use bone foam  in 20-30 minute intervals  Patient will wear TED hose bilaterally to help prevent DVT and clot formation  Discussed the Aquacel bandage.  This bandage will stay in place 7 days postoperatively.  Can be replaced with honeycomb bandages that will be sent home with the patient  Discussed sending the patient home with tramadol and oxycodone  for as needed pain management.  Patient will also be sent home with Celebrex to help with swelling and inflammation.  Patient will take an 81 mg  aspirin  twice daily for DVT prophylaxis  JP drain removed without difficulty, intact  Weight-Bearing as tolerated to left leg  Patient will follow-up with [facility] in 2 weeks for staple removal and reevaluation  Fonda Koyanagi, PA-C [facility] 07/23/2024, 8:08 AM

## 2024-07-22 NOTE — Progress Notes (Signed)
 PT Cancellation Note  Patient Details Name: Tom Goodman MRN: [id number] DOB: [birth year]   Cancelled Treatment:    Reason Eval/Treat Not Completed: Medical issues which prohibited therapy Spoke with PACU nurse, pt still struggling to move his operative leg and numb to the knee.  Not appropriate for PT POD0, will plan to initiate PT POD1 tomorrow morning.    Carmin JONELLE Deed, DPT 07/22/2024, 4:08 PM

## 2024-07-22 NOTE — Interval H&P Note (Signed)
 History and Physical Interval Note:  07/22/2024 11:03 AM  Tom Goodman  has presented today for surgery, with the diagnosis of Primary osteoarthritis of left knee.  The various methods of treatment have been discussed with the patient and family. After consideration of risks, benefits and other options for treatment, the patient has consented to  Procedure(s): ARTHROPLASTY, KNEE, TOTAL, USING IMAGELESS COMPUTER-ASSISTED NAVIGATION (Left) as a surgical intervention.  The patient's history has been reviewed, patient examined, no change in status, stable for surgery.  I have reviewed the patient's chart and labs.  Questions were answered to the patient's satisfaction.     Tom Goodman P Tom Goodman

## 2024-07-22 NOTE — Discharge Summary (Signed)
 Physician Discharge Summary  Subjective: 1 Day Post-Op Procedure(s) (LRB): ARTHROPLASTY, KNEE, TOTAL, USING IMAGELESS COMPUTER-ASSISTED NAVIGATION (Left) Patient reports pain as mild.   Patient seen in rounds with Dr. Mardee. Patient is well, and has had no acute complaints or problems Denies any CP, SOB, N/V, fevers or chills We will start therapy today.  Patient is ready to go home  Physician Discharge Summary  Patient ID: Tom Goodman MRN: [id number] DOB/AGE: 08/26/1951 73 y.o.  Admit date: 07/22/2024 Discharge date: 07/23/2024  Admission Diagnoses:  Discharge Diagnoses:  Principal Problem:   History of total knee arthroplasty, left   Discharged Condition: good  Hospital Course: Patient presented to the hospital on 07/22/2024 for an elective left total knee arthroplasty performed by Dr. Mardee. Patient was given 1g of TXA and 2g of Ancef  prior to the procedure. he  tolerated the procedure well without any complications. See procedural note below for details. Postoperatively, the patient did very well. he  was able to pass PT protocols on post-op day one without any issues. JP drain was removed without any difficulty and was intact. he  was able to void his bladder without any difficulty. Physical exam was unremarkable. he  denies any SOB, CP, N/V, fevers or chills. Vital signs are stable. Patient is stable to discharge home.  PROCEDURE:  Left total knee arthroplasty using computer-assisted navigation  SURGEON:  Lynwood SHAUNNA Mardee Mickey. M.D.  ASSISTANT:  Sidra Koyanagi, PA-C (present and scrubbed throughout the case, critical for assistance with exposure, retraction, instrumentation, and closure)  ANESTHESIA: spinal  ESTIMATED BLOOD LOSS: 50 mL  FLUIDS REPLACED: 900 mL of crystalloid  TOURNIQUET TIME: 77 minutes  DRAINS: JP drains placed  SOFT TISSUE RELEASES: Anterior cruciate ligament, posterior cruciate ligament, deep portions of the medial collateral ligament,  patellofemoral ligament  IMPLANTS UTILIZED: DePuy Attune posterior stabilized femoral component (cemented), rotating platform tibial component (cemented), 41 mm medialized dome patella (cemented), and a 5 mm stabilized rotating platform polyethylene insert.  Treatments: None  Discharge Exam: Blood pressure 127/68, pulse 78, temperature 98.5 F (36.9 C), temperature source Oral, resp. rate 15, height 5' 10 (1.778 m), weight 79.9 kg, SpO2 96%.   Disposition: Home   Allergies as of 07/23/2024       Reactions   Corticosteroids Swelling, Other (See Comments)   Swelling of lips   Prednisone  Swelling, Other (See Comments)   Swollen lips        Medication List     TAKE these medications    ARIPiprazole 5 MG tablet Commonly known as: ABILIFY Take 5 mg by mouth daily.   aspirin  81 MG tablet Take 1 tablet (81 mg total) by mouth 2 (two) times daily. What changed: when to take this   atorvastatin  40 MG tablet Commonly known as: LIPITOR Take 1 tablet (40 mg total) by mouth daily.   celecoxib 200 MG capsule Commonly known as: CELEBREX Take 1 capsule (200 mg total) by mouth 2 (two) times daily.   chlorthalidone 25 MG tablet Commonly known as: HYGROTON Take 25 mg by mouth daily.   citalopram  20 MG tablet Commonly known as: CELEXA  Take 20 mg by mouth daily.   cyanocobalamin 500 MCG tablet Commonly known as: VITAMIN B12 Take 500 mcg by mouth daily.   ELDERBERRY PO Take by mouth.   furosemide 20 MG tablet Commonly known as: LASIX Take 20 mg by mouth daily.   gabapentin  800 MG tablet Commonly known as: NEURONTIN  Take 800 mg by mouth 2 (two) times daily.  HYDROcodone -acetaminophen  10-325 MG tablet Commonly known as: NORCO Take 1 tablet by mouth 2 (two) times daily as needed for moderate pain.   hydroxychloroquine 200 MG tablet Commonly known as: PLAQUENIL Take 200 mg by mouth 2 (two) times daily.   isosorbide  mononitrate 30 MG 24 hr tablet Commonly known as:  IMDUR  Take 30 mg by mouth daily.   loperamide  2 MG capsule Commonly known as: IMODIUM  Take 2 mg by mouth in the morning, at noon, and at bedtime.   nitroGLYCERIN  0.4 MG SL tablet Commonly known as: NITROSTAT  Place 1 tablet (0.4 mg total) under the tongue every 5 (five) minutes x 3 doses as needed for chest pain.   oxyCODONE  5 MG immediate release tablet Commonly known as: Oxy IR/ROXICODONE  Take 1 tablet (5 mg total) by mouth every 4 (four) hours as needed for moderate pain (pain score 4-6) (pain score 4-6).   tamsulosin 0.4 MG Caps capsule Commonly known as: FLOMAX Take 0.8 mg by mouth daily.   traMADol 50 MG tablet Commonly known as: ULTRAM Take 1-2 tablets (50-100 mg total) by mouth every 4 (four) hours as needed for moderate pain (pain score 4-6).   Trunature Digestive Probiotic Caps Take 1 capsule by mouth daily.               Durable Medical Equipment  (From admission, onward)           Start     Ordered   07/22/24 1538  DME Walker rolling  Once       Question:  Patient needs a walker to treat with the following condition  Answer:  Total knee replacement status   07/22/24 1537   07/22/24 1538  DME Bedside commode  Once       Comments: Patient is not able to walk the distance required to go the bathroom, or he/she is unable to safely negotiate stairs required to access the bathroom.  A 3in1 BSC will alleviate this problem  Question:  Patient needs a bedside commode to treat with the following condition  Answer:  Total knee replacement status   07/22/24 1537            Follow-up Information     Kip Lynwood Double, PA-C Follow up on 08/05/2024.   Specialty: Physician Assistant Why: at 1:45pm Contact information: [address] [place] [address] [phone]         Mardee Lynwood SQUIBB, MD Follow up on 09/03/2024.   Specialty: Orthopedic Surgery Why: at 2:45pm Contact information: [address] [facility] [place]  [address] [phone]                 Signed: Sidra Koyanagi 07/23/2024, 8:14 AM   Objective: Vital signs in last 24 hours: Temp:  [97.6 F (36.4 C)-98.5 F (36.9 C)] 98.5 F (36.9 C) (10/23 0726) Pulse Rate:  [60-82] 78 (10/23 0726) Resp:  [12-18] 15 (10/23 0726) BP: (106-137)/(56-92) 127/68 (10/23 0726) SpO2:  [95 %-100 %] 96 % (10/23 0726) Weight:  [79.9 kg] 79.9 kg (10/22 1515)  Intake/Output from previous day:  Intake/Output Summary (Last 24 hours) at 07/23/2024 0814 Last data filed at 07/23/2024 0726 Gross per 24 hour  Intake 2682.21 ml  Output 830 ml  Net 1852.21 ml    Intake/Output this shift: Total I/O In: -  Out: 200 [Urine:200]  Labs: Recent Labs    07/22/24 1016  HGB 13.6   Recent Labs    07/22/24 1016  HCT 40.0  Recent Labs    07/22/24 1016  NA 142  K 3.0*  CL 103  BUN 24*  CREATININE 2.20*  GLUCOSE 127*   No results for input(s): LABPT, INR in the last 72 hours.  EXAM: General - Patient is Alert, Appropriate, and Oriented Extremity - Neurologically intact Neurovascular intact Sensation intact distally Intact pulses distally Dorsiflexion/Plantar flexion intact No cellulitis present Compartment soft Dressing - dressing C/D/I and no drainage Motor Function - intact, moving foot and toes well on exam. JP Drain pulled without difficulty. Intact  Assessment/Plan: 1 Day Post-Op Procedure(s) (LRB): ARTHROPLASTY, KNEE, TOTAL, USING IMAGELESS COMPUTER-ASSISTED NAVIGATION (Left) Procedure(s) (LRB): ARTHROPLASTY, KNEE, TOTAL, USING IMAGELESS COMPUTER-ASSISTED NAVIGATION (Left) Past Medical History:  Diagnosis Date   Benign heart murmur 1970   BPH (benign prostatic hyperplasia)    Cataracts, bilateral    Chest pain    CKD (chronic kidney disease), stage III (HCC)    Coronary atherosclerosis of native coronary artery 03/09/2009   a.) PCI 06/09/2009: OM1 (DES; unk type); b.) staged PCI 03/22/2009: PLB (DES; unk type)   DDD  (degenerative disc disease), lumbar    a.) L4-S1 MIS decompression and discectomy 07/05/2020   Depression    Diarrhea, functional    Diastolic dysfunction    Diverticulosis of colon    Essential hypertension, benign    GERD (gastroesophageal reflux disease)    History of bilateral cataract extraction    Hypercholesteremia    Implantable loop recorder present 12/13/2016   Inflammatory arthritis    a.) on DMARD therapy (hydroxychloroquine)   Kidney stones    Long-term current use of immunomodulator    a.) on DMARD therapy (hydroxychloroquine) for inflammatory arthritis   Long-term use of aspirin  therapy    Mixed hyperlipidemia    OSA (obstructive sleep apnea)    a.) no nocturnal PAP therapy   Osteoarthritis    Personal history of Horticulturist, commercial)    Prediabetes    Principal Problem:   History of total knee arthroplasty, left  Estimated body mass index is 25.28 kg/m as calculated from the following:   Height as of this encounter: 5' 10 (1.778 m).   Weight as of this encounter: 79.9 kg.  Patient will continue to work with physical therapy to pass postoperative PT protocols, ROM and strengthening   Discussed with the patient continuing to utilize Polar Care   Patient will use bone foam in 20-30 minute intervals   Patient will wear TED hose bilaterally to help prevent DVT and clot formation   Discussed the Aquacel bandage.  This bandage will stay in place 7 days postoperatively.  Can be replaced with honeycomb bandages that will be sent home with the patient   Discussed sending the patient home with tramadol and oxycodone  for as needed pain management.  Patient will also be sent home with Celebrex to help with swelling and inflammation.  Patient will take an 81 mg aspirin  twice daily for DVT prophylaxis   JP drain removed without difficulty, intact   Weight-Bearing as tolerated to left leg   Patient will follow-up with [facility] clinic orthopedics in 2 weeks for  staple removal and reevaluation  Diet - Regular diet Follow up - in 2 weeks Activity - WBAT Disposition - Home Condition Upon Discharge - Good DVT Prophylaxis - Aspirin  and TED hose  Fonda CHARLENA Koyanagi, PA-C Orthopaedic Surgery 07/23/2024, 8:14 AM

## 2024-07-23 ENCOUNTER — Other Ambulatory Visit: Payer: Self-pay

## 2024-07-23 ENCOUNTER — Encounter: Payer: Self-pay | Admitting: Orthopedic Surgery

## 2024-07-23 DIAGNOSIS — M1712 Unilateral primary osteoarthritis, left knee: Secondary | ICD-10-CM | POA: Diagnosis not present

## 2024-07-23 MED ORDER — TRAMADOL HCL 50 MG PO TABS
50.0000 mg | ORAL_TABLET | ORAL | 0 refills | Status: AC | PRN
  Filled 2024-07-23: qty 30, 4d supply, fill #0

## 2024-07-23 MED ORDER — ASPIRIN 81 MG PO TABS: 81.0000 mg | ORAL_TABLET | Freq: Two times a day (BID) | ORAL | Status: AC

## 2024-07-23 MED ORDER — OXYCODONE HCL 5 MG PO TABS
5.0000 mg | ORAL_TABLET | ORAL | 0 refills | Status: AC | PRN
  Filled 2024-07-23: qty 30, 5d supply, fill #0

## 2024-07-23 MED ORDER — CELECOXIB 200 MG PO CAPS
200.0000 mg | ORAL_CAPSULE | Freq: Two times a day (BID) | ORAL | 1 refills | Status: AC
  Filled 2024-07-23: qty 60, 30d supply, fill #0

## 2024-07-23 NOTE — Evaluation (Signed)
 Physical Therapy Evaluation Patient Details Name: Tom Goodman MRN: [id number] DOB: Feb 12, 1951 Today's Date: 07/23/2024  History of Present Illness  73 y.o. male who is post op day 1 L TKA. PMH: CKD, CAD, depression, GERD, HLD, HTN, lumbar laminectomy.  Clinical Impression  Pt showed great effort and tolerance with POD1 PT eval and treat.  He reported no pain t/o the session, even during ROM and WBing activity.  He showed good strength with exercises and understood HEPs well.  Pt was able to show consistent and appropriate cadence with the walker with no apparent limp or hesitation, did well managing steps w/o physical assist and only minimal cuing.  Pt had >100* of AROM flexion and generally is at or above expected POD1 function.  Pt will benefit from continued PT per TKA protocols.        If plan is discharge home, recommend the following: Assist for transportation;Help with stairs or ramp for entrance   Can travel by private vehicle        Equipment Recommendations None recommended by PT  Recommendations for Other Services       Functional Status Assessment Patient has had a recent decline in their functional status and demonstrates the ability to make significant improvements in function in a reasonable and predictable amount of time.     Precautions / Restrictions Precautions Precautions: Fall Recall of Precautions/Restrictions: Intact Restrictions Weight Bearing Restrictions Per Provider Order: Yes LLE Weight Bearing Per Provider Order: Weight bearing as tolerated      Mobility  Bed Mobility Overal bed mobility:  (in recliner pre/post session)                  Transfers Overall transfer level: Independent Equipment used: None               General transfer comment: able to rise w/o hesitation or need for UE use to stabilize - educated on need to not over-do-it early    Ambulation/Gait Ambulation/Gait assistance: Supervision Gait Distance (Feet):  200 Feet Assistive device: Rolling walker (2 wheels)         General Gait Details: light reliance on the walker able to maintain a consistent cadence with good speed and confidence.  Minimal cuing for swing through/weight acceptance awareness  Stairs Stairs: Yes Stairs assistance: Supervision Stair Management: No rails, Backwards, With walker Number of Stairs: 4 General stair comments: easily negotiates up/down steps with only incedental assist with walker placement/use - good safety and confidence with the issue  Wheelchair Mobility     Tilt Bed    Modified Rankin (Stroke Patients Only)       Balance Overall balance assessment: Modified Independent, Mild deficits observed, not formally tested                                           Pertinent Vitals/Pain Pain Assessment Pain Assessment: No/denies pain (little to no reaction to)    Home Living Family/patient expects to be discharged to:: Private residence Living Arrangements: Spouse/significant other Available Help at Discharge: Family;Available 24 hours/day Type of Home: House Home Access: Stairs to enter Entrance Stairs-Rails: None Entrance Stairs-Number of Steps: 3   Home Layout: One level Home Equipment: Agricultural consultant (2 wheels);Cane - single point;BSC/3in1;Hand held shower head;Grab bars - tub/shower (rails at commode)      Prior Function Prior Level of Function : Independent/Modified Independent;Driving  Mobility Comments: IND without AD use ADLs Comments: Pt reports staying active     Extremity/Trunk Assessment   Upper Extremity Assessment Upper Extremity Assessment: Overall WFL for tasks assessed    Lower Extremity Assessment Lower Extremity Assessment: Overall WFL for tasks assessed LLE Deficits / Details: expected post-op weakness, with good overall functional status    Cervical / Trunk Assessment Cervical / Trunk Assessment: Normal  Communication    Communication Communication: No apparent difficulties    Cognition Arousal: Alert Behavior During Therapy: WFL for tasks assessed/performed   PT - Cognitive impairments: No apparent impairments                         Following commands: Intact       Cueing       General Comments General comments (skin integrity, edema, etc.): Pt was able to perform all expected POD1 tasks w/o issue and reported no pain t/o the session    Exercises Total Joint Exercises Ankle Circles/Pumps: AROM, 10 reps Quad Sets: Strengthening, 10 reps Heel Slides: Strengthening, 10 reps Hip ABduction/ADduction: Strengthening, 10 reps Straight Leg Raises: AROM, 10 reps Long Arc Quad: Strengthening, 10 reps Knee Flexion: PROM, 5 reps Goniometric ROM: 0-103   Assessment/Plan    PT Assessment Patient needs continued PT services  PT Problem List Decreased strength;Decreased range of motion;Decreased knowledge of use of DME;Decreased safety awareness;Pain;Decreased knowledge of precautions       PT Treatment Interventions DME instruction;Gait training;Stair training;Functional mobility training;Therapeutic activities;Therapeutic exercise;Balance training;Patient/family education    PT Goals (Current goals can be found in the Care Plan section)  Acute Rehab PT Goals Patient Stated Goal: go home today PT Goal Formulation: With patient Time For Goal Achievement: 07/30/24 Potential to Achieve Goals: Good    Frequency BID     Co-evaluation               AM-PAC PT 6 Clicks Mobility  Outcome Measure                  End of Session   Activity Tolerance: Patient tolerated treatment well Patient left: in chair;with call bell/phone within reach Nurse Communication: Mobility status PT Visit Diagnosis: Muscle weakness (generalized) (M62.81);Difficulty in walking, not elsewhere classified (R26.2)    Time: 9174-9143 PT Time Calculation (min) (ACUTE ONLY): 31 min   Charges:    PT Evaluation $PT Eval Low Complexity: 1 Low PT Treatments $Gait Training: 8-22 mins $Therapeutic Exercise: 8-22 mins PT General Charges $$ ACUTE PT VISIT: 1 Visit         Carmin JONELLE Deed, DPT 07/23/2024, 9:56 AM

## 2024-07-23 NOTE — Anesthesia Postprocedure Evaluation (Signed)
 Anesthesia Post Note  Patient: Tom Goodman  Procedure(s) Performed: ARTHROPLASTY, KNEE, TOTAL, USING IMAGELESS COMPUTER-ASSISTED NAVIGATION (Left: Knee)  Patient location during evaluation: Short Stay Anesthesia Type: Spinal Level of consciousness: awake and alert and oriented Pain management: pain level controlled Vital Signs Assessment: post-procedure vital signs reviewed and stable Respiratory status: spontaneous breathing and respiratory function stable Cardiovascular status: blood pressure returned to baseline Postop Assessment: no headache, no backache, no apparent nausea or vomiting, adequate PO intake and able to ambulate Anesthetic complications: no   No notable events documented.   Last Vitals:  Vitals:   07/22/24 1917 07/23/24 0442  BP: 128/74 127/60  Pulse: 64 62  Resp: 18 16  Temp: 36.6 C 36.7 C  SpO2: 98% 96%    Last Pain:  Vitals:   07/23/24 0442  TempSrc: Temporal  PainSc:                  [name]

## 2024-07-23 NOTE — Evaluation (Signed)
 Occupational Therapy Evaluation Patient Details Name: Tom Goodman MRN: [id number] DOB: [birth year] Today's Date: 07/23/2024   History of Present Illness   73 y.o. male who is post op day 1 L TKA. PMH: CKD, CAD, depression, GERD, HLD, HTN, lumbar laminectomy.     Clinical Impressions Pt seen for OT evaluation this date, POD#1 from above surgery. Pt was independent in all ADLs, IADLs and mobility prior to surgery. Pt is eager to return to PLOF with less pain and improved safety and independence. Pt currently requires minimal assist for LB dressing while in seated position due to pain and limited AROM of L knee. Pt instructed in polar care mgt, falls prevention strategies, home/routines modifications, DME/AE for LB bathing and dressing tasks, and compression stocking mgt. He performed bed mobility and STS with independence, ambulated in the room with and without RW with supervision. No LOB noted and pt denies pain during session. Do not currently anticipate any OT needs following this hospitalization.        If plan is discharge home, recommend the following:   A little help with walking and/or transfers;Help with stairs or ramp for entrance;Assistance with cooking/housework     Functional Status Assessment   Patient has not had a recent decline in their functional status     Equipment Recommendations   None recommended by OT;Other (comment) (has needed DME)     Recommendations for Other Services         Precautions/Restrictions   Precautions Precautions: Fall Recall of Precautions/Restrictions: Intact Restrictions Weight Bearing Restrictions Per Provider Order: Yes LLE Weight Bearing Per Provider Order: Weight bearing as tolerated     Mobility Bed Mobility Overal bed mobility: Independent                  Transfers Overall transfer level: Independent Equipment used: None               General transfer comment: no device needed to stand,  ambulated with and without RW with supervision during session      Balance Overall balance assessment: Modified Independent, Mild deficits observed, not formally tested                                         ADL either performed or assessed with clinical judgement   ADL Overall ADL's : Needs assistance/impaired                 Upper Body Dressing : Independent;Sitting Upper Body Dressing Details (indicate cue type and reason): donn shirt Lower Body Dressing: Sit to/from stand;Sitting/lateral leans;Minimal assistance;Supervision/safety Lower Body Dressing Details (indicate cue type and reason): Min A to donn L sock and shoe; able to donn pants and underwear without difficulty Toilet Transfer: Supervision/safety;Regular Toilet;Rolling walker (2 wheels);Ambulation           Functional mobility during ADLs: Supervision/safety;Rolling walker (2 wheels)       Vision Patient Visual Report: No change from baseline       Perception         Praxis         Pertinent Vitals/Pain Pain Assessment Pain Assessment: No/denies pain (at rest)     Extremity/Trunk Assessment Upper Extremity Assessment Upper Extremity Assessment: Overall WFL for tasks assessed   Lower Extremity Assessment Lower Extremity Assessment: Overall WFL for tasks assessed;LLE deficits/detail LLE Deficits / Details: s/p L TKA   Cervical /  Trunk Assessment Cervical / Trunk Assessment: Normal   Communication Communication Communication: No apparent difficulties   Cognition Arousal: Alert Behavior During Therapy: WFL for tasks assessed/performed Cognition: No apparent impairments                                       Cueing  General Comments          Exercises Other Exercises Other Exercises: Edu on role of OT in acute setting, polar care management, TED hose management and tips/tricks to maximize ease/safety with ADL performance s/p TKA.   Shoulder  Instructions      Home Living Family/patient expects to be discharged to:: Private residence Living Arrangements: Spouse/significant other Available Help at Discharge: Family;Available 24 hours/day Type of Home: House Home Access: Stairs to enter [employer] of Steps: 3 Entrance Stairs-Rails: None Home Layout: One level     Bathroom Shower/Tub: Chief Strategy Officer: Handicapped height Bathroom Accessibility: Yes How Accessible: Accessible via walker Home Equipment: Rolling Walker (2 wheels);Cane - single point;BSC/3in1;Hand held shower head;Grab bars - tub/shower          Prior Functioning/Environment Prior Level of Function : Independent/Modified Independent;Driving             Mobility Comments: IND without AD use      OT Problem List:     OT Treatment/Interventions:        OT Goals(Current goals can be found in the care plan section)       OT Frequency:       Co-evaluation              AM-PAC OT 6 Clicks Daily Activity     Outcome Measure Help from another person eating meals?: None Help from another person taking care of personal grooming?: None Help from another person toileting, which includes using toliet, bedpan, or urinal?: None Help from another person bathing (including washing, rinsing, drying)?: None Help from another person to put on and taking off regular upper body clothing?: None Help from another person to put on and taking off regular lower body clothing?: A Little 6 Click Score: 23   End of Session Equipment Utilized During Treatment: Rolling walker (2 wheels) Nurse Communication: Mobility status  Activity Tolerance: Patient tolerated treatment well Patient left: in chair;with call bell/phone within reach  OT Visit Diagnosis: Other abnormalities of gait and mobility (R26.89)                Time: 9254-9184 OT Time Calculation (min): 30 min Charges:  OT General Charges $OT Visit: 1 Visit OT  Evaluation $OT Eval Low Complexity: 1 Low OT Treatments $Self Care/Home Management : 8-22 mins Meilani Edmundson, OTR/L  07/23/24, 9:06 AM  Raylin Winer E Mickie Kozikowski 07/23/2024, 9:04 AM

## 2024-07-23 NOTE — Op Note (Signed)
 OPERATIVE NOTE  DATE OF SURGERY:  07/22/2024  PATIENT NAME:  Tom Goodman   DOB: [birth year]  MRN: [id number]  PRE-OPERATIVE DIAGNOSIS: Degenerative arthrosis of the left knee, primary  POST-OPERATIVE DIAGNOSIS:  Same  PROCEDURE:  Left total knee arthroplasty using computer-assisted navigation  SURGEON:  Lynwood SHAUNNA Mardee Mickey. M.D.  ASSISTANT:  Sidra Koyanagi, PA-C (present and scrubbed throughout the case, critical for assistance with exposure, retraction, instrumentation, and closure)  ANESTHESIA: spinal  ESTIMATED BLOOD LOSS: 50 mL  FLUIDS REPLACED: 900 mL of crystalloid  TOURNIQUET TIME: 81 minutes  DRAINS: 2 medium hemovac drains  SOFT TISSUE RELEASES: Anterior cruciate ligament, posterior cruciate ligament, deep medial collateral ligament, patellofemoral ligament  IMPLANTS UTILIZED: DePuy Attune size 5 posterior stabilized femoral component (cemented), size 5 rotating platform tibial component (cemented), 41 mm medialized dome patella (cemented), and a 5 mm stabilized rotating platform polyethylene insert.  INDICATIONS FOR SURGERY: Tom Goodman is a 73 y.o. year old male with a long history of progressive knee pain. X-rays demonstrated severe degenerative changes in tricompartmental fashion. The patient had not seen any significant improvement despite conservative nonsurgical intervention. After discussion of the risks and benefits of surgical intervention, the patient expressed understanding of the risks benefits and agree with plans for total knee arthroplasty.   The risks, benefits, and alternatives were discussed at length including but not limited to the risks of infection, bleeding, nerve injury, stiffness, blood clots, the need for revision surgery, cardiopulmonary complications, among others, and they were willing to proceed.  PROCEDURE IN DETAIL: The patient was brought into the operating room and, after adequate spinal anesthesia was achieved, a tourniquet was placed  on the patient's upper thigh. The patient's knee and leg were cleaned and prepped with alcohol and DuraPrep and draped in the usual sterile fashion. A timeout was performed as per usual protocol. The lower extremity was exsanguinated using an Esmarch, and the tourniquet was inflated to 300 mmHg. An anterior longitudinal incision was made followed by a standard mid vastus approach. The deep fibers of the medial collateral ligament were elevated in a subperiosteal fashion off of the medial flare of the tibia so as to maintain a continuous soft tissue sleeve. The patella was subluxed laterally and the patellofemoral ligament was incised. Inspection of the knee demonstrated severe degenerative changes with full-thickness loss of articular cartilage. Osteophytes were debrided using a rongeur. Anterior and posterior cruciate ligaments were excised. Two 4.0 mm Schanz pins were inserted in the femur and into the tibia for attachment of the array of trackers used for computer-assisted navigation. Hip center was identified using a circumduction technique. Distal landmarks were mapped using the computer. The distal femur and proximal tibia were mapped using the computer. The distal femoral cutting guide was positioned using computer-assisted navigation so as to achieve a 5 distal valgus cut. The femur was sized and it was felt that a size 5 femoral component was appropriate. A size 5 femoral cutting guide was positioned and the anterior cut was performed and verified using the computer. This was followed by completion of the posterior and chamfer cuts. Femoral cutting guide for the central box was then positioned in the center box cut was performed.  Attention was then directed to the proximal tibia. Medial and lateral menisci were excised. The extramedullary tibial cutting guide was positioned using computer-assisted navigation so as to achieve a 0 varus-valgus alignment and 3 posterior slope. The cut was performed and  verified using the computer. The proximal tibia  was sized and it was felt that a size 5 tibial tray was appropriate. Tibial and femoral trials were inserted followed by insertion of a 5 mm polyethylene insert. This allowed for excellent mediolateral soft tissue balancing both in flexion and in full extension. Finally, the patella was cut and prepared so as to accommodate a 41 mm medialized dome patella. A patella trial was placed and the knee was placed through a range of motion with excellent patellar tracking appreciated. The femoral trial was removed after debridement of posterior osteophytes. The central post-hole for the tibial component was reamed followed by insertion of a keel punch. Tibial trials were then removed. Cut surfaces of bone were irrigated with copious amounts of normal saline using pulsatile lavage and then suctioned dry. Polymethylmethacrylate cement with gentamicin was prepared in the usual fashion using a vacuum mixer. Cement was applied to the cut surface of the proximal tibia as well as along the undersurface of a size 5 rotating platform tibial component. Tibial component was positioned and impacted into place. Excess cement was removed using Personal assistant. Cement was then applied to the cut surfaces of the femur as well as along the posterior flanges of the size 5 femoral component. The femoral component was positioned and impacted into place. Excess cement was removed using Personal assistant. A 5 mm polyethylene trial was inserted and the knee was brought into full extension with steady axial compression applied. Finally, cement was applied to the backside of a 41 mm medialized dome patella and the patellar component was positioned and patellar clamp applied. Excess cement was removed using Personal assistant. After adequate curing of the cement, the tourniquet was deflated after a total tourniquet time of 81 minutes. Hemostasis was achieved using electrocautery. The knee was irrigated with  copious amounts of normal saline using pulsatile lavage followed by 450 ml of Surgiphor and then suctioned dry. 20 mL of 1.3% Exparel  and 60 mL of 0.25% Marcaine  in 40 mL of normal saline was injected along the posterior capsule, medial and lateral gutters, and along the arthrotomy site. A 5 mm stabilized rotating platform polyethylene insert was inserted and the knee was placed through a range of motion with excellent mediolateral soft tissue balancing appreciated and excellent patellar tracking noted. 2 medium drains were placed in the wound bed and brought out through separate stab incisions. The medial parapatellar portion of the incision was reapproximated using interrupted sutures of #1 Vicryl. Subcutaneous tissue was approximated in layers using first #0 Vicryl followed #2-0 Vicryl. The skin was approximated with skin staples. A sterile dressing was applied.  The patient tolerated the procedure well and was transported to the recovery room in stable condition.    Tom Goodman, Jr., M.D.

## 2024-07-23 NOTE — Plan of Care (Signed)

## 2024-07-23 NOTE — Progress Notes (Signed)
 DISCHARGE NOTE:   Pt dc with IV removed and dc instructions given. Pt has both TED hose on and in place. Pt received medications delivered to hospital room. Pt also educated on polar care and how to use it. Pt given 2 honeycomb dressings. Pt voices no questions or concerns at this time. Pt wheeled down to medical mall entrance by staff. Pt's wife provided transportation.

## 2024-09-01 ENCOUNTER — Other Ambulatory Visit (HOSPITAL_COMMUNITY): Payer: Self-pay

## 2024-10-16 ENCOUNTER — Other Ambulatory Visit: Payer: Self-pay | Admitting: Internal Medicine

## 2024-10-16 DIAGNOSIS — R1013 Epigastric pain: Secondary | ICD-10-CM

## 2024-10-22 ENCOUNTER — Ambulatory Visit
Admission: RE | Admit: 2024-10-22 | Discharge: 2024-10-22 | Disposition: A | Discharge status: Home / Self Care | Source: Ambulatory Visit | Attending: Internal Medicine | Admitting: Internal Medicine

## 2024-10-22 DIAGNOSIS — R1013 Epigastric pain: Secondary | ICD-10-CM | POA: Diagnosis present
